# Patient Record
Sex: Female | Born: 1950 | Race: Black or African American | Hispanic: No | Marital: Single | Smoking: Never smoker
Health system: Southern US, Community
[De-identification: ages and names within clinical notes are randomized; demographics above are authoritative.]

## PROBLEM LIST (undated history)

## (undated) DIAGNOSIS — K2970 Gastritis, unspecified, without bleeding: Secondary | ICD-10-CM

## (undated) DIAGNOSIS — R51 Headache: Secondary | ICD-10-CM

## (undated) DIAGNOSIS — E785 Hyperlipidemia, unspecified: Secondary | ICD-10-CM

## (undated) DIAGNOSIS — I10 Essential (primary) hypertension: Secondary | ICD-10-CM

## (undated) DIAGNOSIS — I639 Cerebral infarction, unspecified: Secondary | ICD-10-CM

## (undated) DIAGNOSIS — R519 Headache, unspecified: Secondary | ICD-10-CM

## (undated) DIAGNOSIS — G43909 Migraine, unspecified, not intractable, without status migrainosus: Secondary | ICD-10-CM

## (undated) HISTORY — DX: Headache, unspecified: R51.9

## (undated) HISTORY — PX: ELBOW SURGERY: SHX618

## (undated) HISTORY — DX: Cerebral infarction, unspecified: I63.9

## (undated) HISTORY — DX: Migraine, unspecified, not intractable, without status migrainosus: G43.909

## (undated) HISTORY — DX: Headache: R51

---

## 1999-07-16 ENCOUNTER — Other Ambulatory Visit: Admission: RE | Admit: 1999-07-16 | Discharge: 1999-07-16 | Payer: Self-pay | Admitting: Family Medicine

## 1999-07-30 ENCOUNTER — Ambulatory Visit (HOSPITAL_COMMUNITY): Admission: RE | Admit: 1999-07-30 | Discharge: 1999-07-30 | Payer: Self-pay | Admitting: Orthopedic Surgery

## 1999-07-30 ENCOUNTER — Encounter: Payer: Self-pay | Admitting: Orthopedic Surgery

## 1999-08-13 ENCOUNTER — Ambulatory Visit (HOSPITAL_COMMUNITY): Admission: RE | Admit: 1999-08-13 | Discharge: 1999-08-13 | Payer: Self-pay | Admitting: Orthopedic Surgery

## 1999-08-13 ENCOUNTER — Encounter: Payer: Self-pay | Admitting: Orthopedic Surgery

## 2003-12-21 ENCOUNTER — Emergency Department (HOSPITAL_COMMUNITY): Admission: EM | Admit: 2003-12-21 | Discharge: 2003-12-21 | Payer: Self-pay

## 2004-05-30 ENCOUNTER — Other Ambulatory Visit: Admission: RE | Admit: 2004-05-30 | Discharge: 2004-05-30 | Payer: Self-pay | Admitting: Family Medicine

## 2004-07-22 ENCOUNTER — Ambulatory Visit (HOSPITAL_COMMUNITY): Admission: RE | Admit: 2004-07-22 | Discharge: 2004-07-22 | Payer: Self-pay | Admitting: Gastroenterology

## 2009-11-07 ENCOUNTER — Observation Stay (HOSPITAL_COMMUNITY): Admission: EM | Admit: 2009-11-07 | Discharge: 2009-11-07 | Payer: Self-pay | Admitting: Emergency Medicine

## 2010-11-16 LAB — CARDIAC PANEL(CRET KIN+CKTOT+MB+TROPI)
CK, MB: 1.8 ng/mL (ref 0.3–4.0)
CK, MB: 2 ng/mL (ref 0.3–4.0)
CK, MB: 2.4 ng/mL (ref 0.3–4.0)
Total CK: 79 U/L (ref 7–177)
Total CK: 89 U/L (ref 7–177)
Troponin I: 0.01 ng/mL (ref 0.00–0.06)

## 2010-11-16 LAB — URINALYSIS, ROUTINE W REFLEX MICROSCOPIC
Bilirubin Urine: NEGATIVE
Glucose, UA: NEGATIVE mg/dL
Hgb urine dipstick: NEGATIVE
Ketones, ur: NEGATIVE mg/dL
Nitrite: NEGATIVE
Protein, ur: NEGATIVE mg/dL
Specific Gravity, Urine: 1.014 (ref 1.005–1.030)
Urobilinogen, UA: 0.2 mg/dL (ref 0.0–1.0)
pH: 7 (ref 5.0–8.0)

## 2010-11-16 LAB — BASIC METABOLIC PANEL
BUN: 6 mg/dL (ref 6–23)
CO2: 25 mEq/L (ref 19–32)
Calcium: 8.8 mg/dL (ref 8.4–10.5)
Chloride: 107 mEq/L (ref 96–112)
Creatinine, Ser: 0.7 mg/dL (ref 0.4–1.2)
GFR calc Af Amer: >60 mL/min (ref >60)
GFR calc non Af Amer: >60 mL/min (ref >60)
Glucose, Bld: 108 mg/dL — ABNORMAL HIGH (ref 70–99)
Potassium: 3.6 mEq/L (ref 3.5–5.1)
Sodium: 139 mEq/L (ref 135–145)

## 2010-11-16 LAB — LIPID PANEL
LDL Cholesterol: 140 mg/dL — ABNORMAL HIGH (ref 0–99)
VLDL: 8 mg/dL (ref 0–40)

## 2010-11-16 LAB — CBC
MCHC: 33.6 g/dL (ref 30.0–36.0)
Platelets: 235 10*3/uL (ref 150–400)
RBC: 4.19 MIL/uL (ref 3.87–5.11)
RDW: 13.5 % (ref 11.5–15.5)

## 2010-11-16 LAB — POCT CARDIAC MARKERS
CKMB, poc: 2 ng/mL (ref 1.0–8.0)
Troponin i, poc: <0.05 ng/mL (ref 0.00–0.09)
Troponin i, poc: <0.05 ng/mL (ref 0.00–0.09)

## 2010-11-16 LAB — PROTIME-INR
INR: 0.97 (ref 0.00–1.49)
Prothrombin Time: 12.8 seconds (ref 11.6–15.2)

## 2010-11-16 LAB — DIFFERENTIAL
Basophils Absolute: 0.1 10*3/uL (ref 0.0–0.1)
Basophils Relative: 2 % — ABNORMAL HIGH (ref 0–1)
Neutro Abs: 3 10*3/uL (ref 1.7–7.7)
Neutrophils Relative %: 50 % (ref 43–77)

## 2010-11-16 LAB — TSH: TSH: 1.256 u[IU]/mL (ref 0.350–4.500)

## 2010-12-21 ENCOUNTER — Observation Stay (HOSPITAL_COMMUNITY)
Admission: AD | Admit: 2010-12-21 | Discharge: 2010-12-23 | Disposition: A | Discharge status: Home / Self Care | Payer: BC Managed Care – PPO | Source: Ambulatory Visit | Attending: Internal Medicine | Admitting: Internal Medicine

## 2010-12-21 ENCOUNTER — Emergency Department (HOSPITAL_COMMUNITY): Payer: BC Managed Care – PPO

## 2010-12-21 DIAGNOSIS — K2970 Gastritis, unspecified, without bleeding: Secondary | ICD-10-CM | POA: Insufficient documentation

## 2010-12-21 DIAGNOSIS — I10 Essential (primary) hypertension: Secondary | ICD-10-CM | POA: Insufficient documentation

## 2010-12-21 DIAGNOSIS — K2990 Gastroduodenitis, unspecified, without bleeding: Secondary | ICD-10-CM | POA: Insufficient documentation

## 2010-12-21 DIAGNOSIS — E785 Hyperlipidemia, unspecified: Secondary | ICD-10-CM | POA: Insufficient documentation

## 2010-12-21 DIAGNOSIS — R0789 Other chest pain: Principal | ICD-10-CM | POA: Insufficient documentation

## 2010-12-21 DIAGNOSIS — K219 Gastro-esophageal reflux disease without esophagitis: Secondary | ICD-10-CM | POA: Insufficient documentation

## 2010-12-21 HISTORY — DX: Essential (primary) hypertension: I10

## 2010-12-21 LAB — BASIC METABOLIC PANEL
BUN: 9 mg/dL (ref 6–23)
Calcium: 9.2 mg/dL (ref 8.4–10.5)
GFR calc non Af Amer: >60 mL/min (ref >60)
Glucose, Bld: 110 mg/dL — ABNORMAL HIGH (ref 70–99)

## 2010-12-21 LAB — DIFFERENTIAL
Basophils Absolute: 0 10*3/uL (ref 0.0–0.1)
Eosinophils Absolute: 0.1 10*3/uL (ref 0.0–0.7)
Eosinophils Relative: 1 % (ref 0–5)
Lymphocytes Relative: 51 % — ABNORMAL HIGH (ref 12–46)
Lymphs Abs: 2.7 10*3/uL (ref 0.7–4.0)
Monocytes Absolute: 0.5 10*3/uL (ref 0.1–1.0)

## 2010-12-21 LAB — CBC
HCT: 35.4 % — ABNORMAL LOW (ref 36.0–46.0)
MCHC: 33.1 g/dL (ref 30.0–36.0)
MCV: 89.6 fL (ref 78.0–100.0)
RDW: 13.1 % (ref 11.5–15.5)

## 2010-12-22 ENCOUNTER — Inpatient Hospital Stay (HOSPITAL_COMMUNITY): Payer: BC Managed Care – PPO

## 2010-12-22 ENCOUNTER — Encounter (HOSPITAL_COMMUNITY): Payer: Self-pay

## 2010-12-22 DIAGNOSIS — R072 Precordial pain: Secondary | ICD-10-CM

## 2010-12-22 DIAGNOSIS — R079 Chest pain, unspecified: Secondary | ICD-10-CM

## 2010-12-22 LAB — CBC
HCT: 33.3 % — ABNORMAL LOW (ref 36.0–46.0)
MCHC: 33 g/dL (ref 30.0–36.0)
MCV: 90.2 fL (ref 78.0–100.0)
RDW: 13.3 % (ref 11.5–15.5)

## 2010-12-22 LAB — CARDIAC PANEL(CRET KIN+CKTOT+MB+TROPI)
CK, MB: 1.6 ng/mL (ref 0.3–4.0)
Relative Index: 1.5 (ref 0.0–2.5)
Relative Index: INVALID (ref 0.0–2.5)
Total CK: 118 U/L (ref 7–177)
Total CK: 97 U/L (ref 7–177)
Troponin I: 0.01 ng/mL (ref 0.00–0.06)
Troponin I: 0.01 ng/mL (ref 0.00–0.06)

## 2010-12-22 LAB — DIFFERENTIAL
Eosinophils Relative: 2 % (ref 0–5)
Lymphocytes Relative: 55 % — ABNORMAL HIGH (ref 12–46)
Lymphs Abs: 3 10*3/uL (ref 0.7–4.0)
Monocytes Absolute: 0.5 10*3/uL (ref 0.1–1.0)
Monocytes Relative: 9 % (ref 3–12)

## 2010-12-22 LAB — MAGNESIUM: Magnesium: 2 mg/dL (ref 1.5–2.5)

## 2010-12-22 LAB — COMPREHENSIVE METABOLIC PANEL
Alkaline Phosphatase: 72 U/L (ref 39–117)
BUN: 8 mg/dL (ref 6–23)
Glucose, Bld: 100 mg/dL — ABNORMAL HIGH (ref 70–99)
Potassium: 3.8 mEq/L (ref 3.5–5.1)
Total Protein: 6 g/dL (ref 6.0–8.3)

## 2010-12-22 LAB — LIPID PANEL
HDL: 52 mg/dL (ref >39)
Triglycerides: 92 mg/dL (ref <150)

## 2010-12-22 LAB — TSH: TSH: 1.616 u[IU]/mL (ref 0.350–4.500)

## 2010-12-22 MED ORDER — TECHNETIUM TC 99M TETROFOSMIN IV KIT
10.0000 | PACK | Freq: Once | INTRAVENOUS | Status: AC | PRN
  Administered 2010-12-22: 9.3 via INTRAVENOUS

## 2010-12-22 MED ORDER — TECHNETIUM TC 99M TETROFOSMIN IV KIT
30.0000 | PACK | Freq: Once | INTRAVENOUS | Status: AC | PRN
  Administered 2010-12-22: 28.4 via INTRAVENOUS

## 2010-12-22 NOTE — H&P (Signed)
NAMECONSEPCION, UTT            ACCOUNT NO.:  [id number]  MEDICAL RECORD NO.:  [id number]           PATIENT TYPE:  E  LOCATION:  APED                          FACILITY:  [facility]  PHYSICIAN:  Talmage Nap, MD  DATE OF BIRTH:  12/22/1950  DATE OF ADMISSION:  12/21/2010 DATE OF DISCHARGE:  LH                             HISTORY & PHYSICAL   PRIMARY CARE PHYSICIAN:  Unassigned.  History obtainable from the patient, the patient's daughter, and aunt.  CHIEF COMPLAINT:  Sudden onset of chest pain, which started at about 11:00 a.m. on December 21, 2010.  The patient is a 60 year old African American female with history of hypertension and hypercholesterolemia as well as gastritis was said to have been in stable health until about 11:00 a.m. on December 21, 2010, when the patient developed retrosternal chest pain while she was cooking.  She described the pain as pressure-like, 10/10 intensity radiating to the neck, to the shoulder, and to the left arm.  This was said to be associated with shortness of breath and diaphoresis.  She claims she was nauseated and had one episode of vomiting.  She denied any fever.  She denied any chills.  She denied any rigor.  Pain was said to have persisted, and the patient first, was said to go to bed and woken up, and pain still persisted.  Pain was said not to have abated and subsequently, the patient presented to the emergency room.  The patient had a similar episode of this chest pain about a year ago, and she was supposed to have a stress test done as an outpatient, and she did not follow up.  After evaluation, the patient was advised to be admitted for further workup and possibly a stress test in this admission.  PAST MEDICAL HISTORY: 1. Positive for hypertension. 2. Gastritis. 3. Hypercholesterolemia.  PAST SURGICAL HISTORY:  Right arm fracture status post open reduction and internal fixation.  PREADMISSION MEDICATIONS:  Benicar/hct, dose  unknown and atorvastatin, dose unknown.  She has no known allergies.  SOCIAL HISTORY:  Negative for alcohol, tobacco use, and the patient works in the [employer].  FAMILY HISTORY:  York Spaniel to be positive for coronary artery disease. Father died of a massive MI in his mid 106s.  REVIEW OF SYSTEMS:  The patient denies any history of headaches.  No blurred vision.  Presently, denies any nausea or vomiting.  No fever. No chills.  No rigor.  No chest pain or shortness of breath.  No abdominal discomfort.  No diarrhea or hematochezia.  No dysuria or hematuria.  No swelling of the lower extremities.  No intolerance to heat or cold, and no neuropsychiatric disorder.  PHYSICAL EXAMINATION:  GENERAL:  Middle-aged lady, not in any obvious respiratory distress, well hydrated. VITAL SIGNS:  Blood pressure is 156/84, pulse is 95, respiratory rate is 18, temperature is 99.6. HEENT:  Pupils are reactive to light and extraocular muscles are intact. NECK:  No jugular venous distention.  No carotid bruit.  No lymphadenopathy. CHEST:  Clear to auscultation. HEART:  Sounds are 1 and 2. ABDOMEN:  Soft, nontender.  Liver, spleen, kidney not  palpable.  Bowel sounds are positive. EXTREMITIES:  No pedal edema. NEUROLOGIC:  Nonfocal. MUSCULOSKELETAL:  Unremarkable. SKIN:  Normal turgor.  LABORATORY DATA:  Initial complete blood count with differential showed WBC of 5.2, hemoglobin of 11.7, hematocrit of 35.4, MCV of 89.6 with a platelet count of 228, neutrophil is 39%, lymphocyte is 51%.  Basic metabolic panel showed sodium of 140, potassium of 2.9, chloride of 102 with a bicarb of 30, glucose is 110, BUN is 9 and creatinine is 0.93. First set of cardiac markers, troponin I less than 0.05 and CK-MB is less than 1.0.  EKG showed normal sinus rhythm with no acute ST-wave change, rate is 93.  Imaging study done on the patient include chest x- ray, which was essentially normal.  IMPRESSION: 1.  Chest pain questionable unstable angina. 2. Hypokalemia. 3. Hypertension. 4. Hypercholesterolemia. 5. Gastritis. 6. Non-compliant with discharge instructions (the patient did not     follow up with stress test as an outpatient in March 2011).  Plan is to admit the patient to telemetry.  The patient will be given: 1. Aspirin 325 mg p.o. daily. 2. Nitroglycerin 0.4 mg sublingual p.r.n. for chest pain. 3. O2 via nasal cannula 3 liters per minute. 4. Morphine 2 mg IV q.4 p.r.n. for chest pain. 5. Benicar HCT 20/25 one p.o. daily. 6. KCl 20 mEq p.o. b.i.d. 7. Lopressor 50 mg p.o. b.i.d. 8. Atorvastatin 20 mg p.o. daily. 9. GI prophylaxis will be with Protonix 40 mg IV q.24. 10.DVT prophylaxis with Lovenox 40 mg subcu q.24.  Other labs to be ordered on this patient will include cardiac enzymes q.6 x3, 2D echo, lipid panel, thyroid panel which include TSH, T3 and T4, CBCD, CMP, and magnesium will be repeated in a.m. Cardiology will be consulted in a.m.for evaluation of the patient for possible cardiolite stress test  in this admission since she failed an outpatient follow-up for a stress test about a year ago.  The patient will be followed and evaluated on daily basis.     Talmage Nap, MD     CN/MEDQ  D:  12/22/2010  T:  12/22/2010  Job:  [id number]  Electronically Signed by Talmage Nap  on 12/22/2010 03:27:05 AM

## 2010-12-27 NOTE — Consult Note (Signed)
NAMEWILMOTH, RASNIC            ACCOUNT NO.:  [id number]  MEDICAL RECORD NO.:  [id number]           PATIENT TYPE:  I  LOCATION:  A204                          FACILITY:  [facility]  PHYSICIAN:  Gerrit Friends. Dietrich Pates, MD, FACCDATE OF BIRTH:  03-02-1951  DATE OF CONSULTATION:  12/22/2010 DATE OF DISCHARGE:                                CONSULTATION   PRIMARY CARDIOLOGIST:  Marsa Aris. Dietrich Pates, MD, [facility]  PRIMARY CARE PHYSICIAN:  Paulita Cradle, family nurse practitioner at [facility].  REASON FOR CONSULTATION:  Chest pain.  HISTORY OF PRESENT ILLNESS:  This is a 60 year old African American female without prior cardiac history who has a history of hypertension and hypercholesterolemia who while cooking on the day of admission, had severe indigestion around 11 a.m., lasting until about 4 p.m.  She took some antacids pills and laid down.  The patient got up about 8 p.m. and felt very lightheaded and dizzy.  The pain returned, that got worse throughout the evening, and she presented to the emergency room.  She states that the pain did radiate to her left shoulder and down her left arm with tingling in her hand.  On arrival to the emergency room, the patient was found to be in normal sinus rhythm.  Her blood pressure was 156/84 with a heart rate of 95, respirations 12.  She was given sublingual nitroglycerin.  She was found to have a potassium of 2.9. This was replaced.  She was also given morphine and aspirin and admitted to rule out myocardial infarction.  We were asked for cardiac evaluation in this setting.  Of note, she was admitted in March 2011 for similar symptoms and ruled out for MI.  At that time, she was scheduled for an outpatient stress Myoview which she did not show for.  PAST MEDICAL HISTORY:  Hypertension, hypercholesterolemia, gastritis.  REVIEW OF SYSTEMS:  Positive for chest pain, lightheadedness, indigestion, low-grade nausea with some  soreness and pain, radiating to the left shoulder down the left arm with tingling in the left hand.  All other systems are reviewed and are found to be negative.  PAST SURGICAL HISTORY:  Right ORIF of ankle.  SOCIAL HISTORY:  She lives in [place] with her mother.  She works at [employer].  She is unmarried.  She does not smoke, drink, or use drugs.  FAMILY HISTORY:  Mother with "heart issues."  Father deceased of an MI at age 25.  She has a brother and a sister who are mentally disabled.  CURRENT MEDICATIONS PRIOR TO ADMISSION: 1. Benicar/HCTZ 40/25 mg daily. 2. Atorvastatin 10 mg daily.  ALLERGIES:  No known drug allergies.  CURRENT LABS:  Sodium 140, potassium 3.8, chloride 104, CO2 31, BUN 8, creatinine 0.95, glucose 100.  Hemoglobin 11.0, hematocrit 33.3, white blood cells 5.4, platelets 255.  Troponin less than 0.05 and 0.01 respectively.  Chest x-ray revealing no acute findings.  EKG revealing normal sinus rhythm with a rate of 93 beats per minute.  PHYSICAL EXAMINATION:  VITAL SIGNS:  Blood pressure 114/76, pulse 61, respirations 16, temperature 98.0, O2 sat 99% on 2 liters.  She weighs 72.4 kg. GENERAL:  She is awake, alert, and oriented, in no acute distress. HEENT:  Head is normocephalic and atraumatic.  Eyes, PERRLA. NECK:  Supple without JVD, thyromegaly, or carotid bruits appreciated. CARDIOVASCULAR:  Regular rate and rhythm without murmurs, rubs, or gallops.  Pulses are 2+ and equal without bruits. LUNGS:  Clear to auscultation without wheezes, rales, or rhonchi. ABDOMEN:  Soft, nontender with 2+ bowel sounds. EXTREMITIES:  Without clubbing, cyanosis, or edema. MUSCULOSKELETAL:  No joint deformity or effusions. NEURO:  Cranial nerves II-XII are grossly intact.  IMPRESSION: 1. Chest pain with negative cardiac enzymes and negative EKGs.  Pain     is worrisome for unstable angina but appears to be more     gastrointestinal in etiology with indigestion.   She was admitted in     March 2011 for same to rule out myocardial infarction which was     ruled out.  She was scheduled for an outpatient stress Myoview but     did not show for the test.  We will have stress test completed this     a.m.  She will be held n.p.o.  Echocardiogram will be completed for     left ventricular function in the setting of hypertension.  For risk     stratification, we will check lipids and TSH. 2. Hypertension.  The patient's blood pressure is currently     controlled.  They have added Lopressor 50 mg b.i.d. to her current     medication regimen with good results.  We will hold the Lopressor     for stress test this morning. 3. Hypercholesterolemia.  We will check status. 4. Rule out gastrointestinal etiology.  We will check Helicobacter     pylori.  She does not have a positive Murphy sign and her LFTs are     normal.  Therefore, it is unlikely at this time to be gallbladder     related. 5. Hypokalemia.  We believe that this may be related to the HCTZ as a     part of her medication regimen.  I agree with adding potassium     replacement.  On behalf of the physicians and providers of [facility], we would like to thank Paulita Cradle at [facility] and the [facility] for allowing [place] to participate in the care of this patient.     Bettey Mare. Lyman Bishop, NP   ______________________________ Gerrit Friends. Dietrich Pates, MD, [facility]    KML/MEDQ  D:  12/22/2010  T:  12/22/2010  Job:  [id number]  cc:   Phill Myron, NP [facility]  [facility]  Electronically Signed by Joni Reining NP on 12/25/2010 04:34:08 PM Electronically Signed by [place] Bing MD [facility] on 12/27/2010 10:24:06 PM

## 2011-01-09 NOTE — Op Note (Signed)
Krista Finley, Krista Finley            ACCOUNT NO.:  [id number]   MEDICAL RECORD NO.:  [id number]          PATIENT TYPE:  AMB   LOCATION:  ENDO                         FACILITY:  [facility]   PHYSICIAN:  Graylin Shiver, M.D.   DATE OF BIRTH:  10/03/1950   DATE OF PROCEDURE:  07/22/2004  DATE OF DISCHARGE:                                 OPERATIVE REPORT   PROCEDURE PERFORMED:  Colonoscopy.   INDICATIONS FOR PROCEDURE:  Heme positive stool.   Informed consent was obtained after explanation of the risks of bleeding,  infection, and perforation.   PREMEDICATIONS:  The procedure was done immediately after an  esophagogastroduodenoscopy with an additional 25 mcg of fentanyl and 1 mg of  Versed given IV.   DESCRIPTION OF PROCEDURE:  With the patient in the left lateral decubitus  position, a rectal exam was performed and no masses were felt.  The Olympus  colonoscope was inserted into the rectum and advanced around the colon to  the cecum.  Cecal landmarks were identified.  The cecum and ascending colon  were normal.  The transverse colon normal.  The descending colon, sigmoid  and rectum were normal.  The scope was retroflexed in the rectum and this  looked normal.  The scope was straightened and brought out.  The patient  tolerated the procedure well without complications.   IMPRESSION:  Normal colonoscopy to the cecum.       SFG/MEDQ  D:  07/22/2004  T:  07/22/2004  Job:  [id number]   cc:   Magnus Sinning. Dimple Casey, M.D.  [address] [place]  [place] [address]  Fax: [phone]

## 2011-01-09 NOTE — Op Note (Signed)
NAMEARCENIA, Krista Finley            ACCOUNT NO.:  [id number]   MEDICAL RECORD NO.:  [id number]          PATIENT TYPE:  AMB   LOCATION:  ENDO                         FACILITY:  [facility]   PHYSICIAN:  Graylin Shiver, M.D.   DATE OF BIRTH:  [birth year]   DATE OF PROCEDURE:  07/22/2004  DATE OF DISCHARGE:                                 OPERATIVE REPORT   PROCEDURE PERFORMED:  Esophagogastroduodenoscopy with biopsy for CLO test.   ENDOSCOPIST:  Graylin Shiver, M.D.   INDICATIONS FOR PROCEDURE:  Chronic heartburn.   Informed consent was obtained after explanation of the risks of bleeding,  infection and perforation.   PREMEDICATIONS:  Fentanyl 75 mcg IV, Versed 7.5 mg IV.   DESCRIPTION OF PROCEDURE:  With the patient in the left lateral decubitus  position the Olympus gastroscope was inserted into the oropharynx and passed  into the esophagus.  It was advanced down the esophagus and into the stomach  and into the duodenum.  The second portion and bulb of the duodenum were  normal.  The stomach showed some mild erythema compatible with a gastritis.  No ulcers or erosions were seen.  No lesions were seen at the fundus or  cardia on retroflexion.  A biopsy was obtained from the distal stomach for  CLO test.  The esophagus looked normal.  The esophagogastric junction was at  40 cm. The patient tolerated the procedure well without complications.   IMPRESSION:  Mild gastritis.   PLAN:  CLO test will be checked.       SFG/MEDQ  D:  07/22/2004  T:  07/22/2004  Job:  [id number]   cc:   Magnus Sinning. Dimple Casey, M.D.  [address] [place]  [place] [address]  Fax: [phone]

## 2011-01-15 NOTE — Discharge Summary (Signed)
[name], Finley            ACCOUNT NO.:  [id number]  MEDICAL RECORD NO.:  [id number]           PATIENT TYPE:  O  LOCATION:  A204                          [facility]ILITY:  [facility]  PHYSICIAN:  Peggye Pitt, M.D. DATE OF BIRTH:  [birth year]  DATE OF ADMISSION:  12/21/2010 DATE OF DISCHARGE:  05/01/2012LH                              DISCHARGE SUMMARY   PRIMARY CARE PHYSICIAN:  Paulita Cradle.  DISCHARGE DIAGNOSES: 1. Chest pain, ruled out for acute coronary syndrome, likely secondary     to gastroesophageal reflux disease/gastritis. 2. History of hypertension. 3. Hyperlipidemia. 4. Gastritis.  DISCHARGE MEDICATIONS: 1. Protonix 40 mg daily. 2. Aspirin 325 mg daily. 3. Benicar HCT 40/25 mg daily. 4. Citracal 1 tablet daily. 5. Lipitor 10 mg in the evening. 6. Multivitamin 1 tablet daily.  DISPOSITION AND FOLLOWUP:  Krista Finley will be discharged home today in stable and improved condition.  Please note that she has had a negative stress Myoview this admit.  She will follow up with her PCP in 3-4 weeks for further recommendations.  CONSULTATION THIS HOSPITALIZATION:  Gerrit Friends. Dietrich Pates, MD, [facility] with Cardiology.  IMAGES AND PROCEDURES: 1. A chest x-ray on December 21, 2010, that showed no acute findings. 2. A stress Myoview performed on December 22, 2010, formal results are     pending.  However, I have received a message from Dr. Dietrich Pates that     stress test is normal and she is okay to go home today.  HISTORY AND PHYSICAL:  For complete details, please refer to dictation by Dr. Beverly Gust on December 22, 2010, but in brief, Krista Finley is a 60- year-old [place] American lady with a history of hypertension, hyperlipidemia, and gastritis diagnosed on EGD by Dr. Evette Cristal in [place] in 2005.  She presented to the hospital with complaints of sudden onset of chest pain.  Apparently, she was cooking, pain radiated to her neck, shoulder, and to her left arm, and felt like a pressure  on her chest.  Because of this, she was brought to the hospital for further evaluation.  HOSPITAL COURSE BY PROBLEM: 1. Chest pain.  She ruled out for acute coronary syndrome with     negative cardiac enzymes and EKGs that demonstrated no acute     ischemic changes.  Because of her coronary artery disease risk     factors, she was taken for a stress Myoview which results are     negative as above.  Her pain to me sounds more like reflux.  She     states the pain is in the center of her chest and travels up into     the neck area and it is especially worse when lying down flat.  She     does have a prior history of gastritis as documented on EGD by Dr.     Evette Cristal in 2005.  At this point, I will start her on a PPI 40 mg     daily.  She is instructed to follow up with her PCP in about 3-4     weeks for followup. 2. All rest of chronic conditions have been stable.  VITALS ON THE DAY OF DISCHARGE:  Blood pressure 99/64, heart rate 68, respirations 18, sats 98% on room air, and temp 97.9.     Peggye Pitt, M.D.     EH/MEDQ  D:  12/23/2010  T:  12/23/2010  Job:  [id number]  cc:   Marisue Ivan. Dietrich Pates, MD, [facility] [address] [place], [place] [address]  Electronically Signed by Peggye Pitt M.D. on 01/15/2011 08:28:31 AM

## 2012-09-17 ENCOUNTER — Encounter (HOSPITAL_COMMUNITY): Payer: Self-pay | Admitting: Family Medicine

## 2012-09-17 ENCOUNTER — Emergency Department (HOSPITAL_COMMUNITY): Payer: BC Managed Care – PPO

## 2012-09-17 ENCOUNTER — Observation Stay (HOSPITAL_COMMUNITY)
Admission: EM | Admit: 2012-09-17 | Discharge: 2012-09-18 | Disposition: A | Discharge status: Home / Self Care | Payer: BC Managed Care – PPO | Attending: Emergency Medicine | Admitting: Emergency Medicine

## 2012-09-17 DIAGNOSIS — R05 Cough: Secondary | ICD-10-CM | POA: Insufficient documentation

## 2012-09-17 DIAGNOSIS — I10 Essential (primary) hypertension: Secondary | ICD-10-CM | POA: Insufficient documentation

## 2012-09-17 DIAGNOSIS — E785 Hyperlipidemia, unspecified: Secondary | ICD-10-CM | POA: Insufficient documentation

## 2012-09-17 DIAGNOSIS — R079 Chest pain, unspecified: Principal | ICD-10-CM | POA: Insufficient documentation

## 2012-09-17 DIAGNOSIS — R0789 Other chest pain: Secondary | ICD-10-CM

## 2012-09-17 DIAGNOSIS — Z79899 Other long term (current) drug therapy: Secondary | ICD-10-CM | POA: Insufficient documentation

## 2012-09-17 DIAGNOSIS — R42 Dizziness and giddiness: Secondary | ICD-10-CM | POA: Insufficient documentation

## 2012-09-17 DIAGNOSIS — R059 Cough, unspecified: Secondary | ICD-10-CM | POA: Insufficient documentation

## 2012-09-17 DIAGNOSIS — Z7902 Long term (current) use of antithrombotics/antiplatelets: Secondary | ICD-10-CM | POA: Insufficient documentation

## 2012-09-17 DIAGNOSIS — R209 Unspecified disturbances of skin sensation: Secondary | ICD-10-CM | POA: Insufficient documentation

## 2012-09-17 HISTORY — DX: Hyperlipidemia, unspecified: E78.5

## 2012-09-17 HISTORY — DX: Gastritis, unspecified, without bleeding: K29.70

## 2012-09-17 LAB — BASIC METABOLIC PANEL
BUN: 9 mg/dL (ref 6–23)
CO2: 26 mEq/L (ref 19–32)
Chloride: 104 mEq/L (ref 96–112)
Creatinine, Ser: 0.68 mg/dL (ref 0.50–1.10)
GFR calc Af Amer: >90 mL/min (ref >90)
Glucose, Bld: 99 mg/dL (ref 70–99)

## 2012-09-17 LAB — TROPONIN I
Troponin I: <0.3 ng/mL (ref <0.30)
Troponin I: <0.3 ng/mL (ref <0.30)

## 2012-09-17 LAB — CBC
HCT: 34.8 % — ABNORMAL LOW (ref 36.0–46.0)
MCV: 90.4 fL (ref 78.0–100.0)
RDW: 13.6 % (ref 11.5–15.5)
WBC: 4.8 10*3/uL (ref 4.0–10.5)

## 2012-09-17 MED ORDER — NITROGLYCERIN 0.4 MG SL SUBL
0.4000 mg | SUBLINGUAL_TABLET | SUBLINGUAL | Status: AC | PRN
  Administered 2012-09-17 (×3): 0.4 mg via SUBLINGUAL
  Filled 2012-09-17: qty 25

## 2012-09-17 MED ORDER — ACETAMINOPHEN 325 MG PO TABS
650.0000 mg | ORAL_TABLET | Freq: Once | ORAL | Status: AC
  Administered 2012-09-17: 650 mg via ORAL
  Filled 2012-09-17: qty 2

## 2012-09-17 MED ORDER — ASPIRIN 81 MG PO CHEW
324.0000 mg | CHEWABLE_TABLET | Freq: Once | ORAL | Status: AC
  Administered 2012-09-17: 324 mg via ORAL

## 2012-09-17 MED ORDER — MORPHINE SULFATE 4 MG/ML IJ SOLN
2.0000 mg | Freq: Once | INTRAMUSCULAR | Status: AC
  Administered 2012-09-17: 2 mg via INTRAVENOUS
  Filled 2012-09-17: qty 1

## 2012-09-17 NOTE — ED Notes (Signed)
Per EMS, pt woke up at 4:30 in the center of her chest radiating down right shoulder and arm. Went to work and became dizzy. No SOB or diaphoresis. 324 ASA and 1 nitro. sts chest pain intermittent.

## 2012-09-17 NOTE — ED Notes (Signed)
Patient transported to X-ray 

## 2012-09-17 NOTE — ED Provider Notes (Addendum)
History     CSN: [id number]  Arrival date & time 09/17/12  4098   First MD Initiated Contact with Patient 09/17/12 [phone]      Chief Complaint  Patient presents with  . Chest Pain    (Consider location/radiation/quality/duration/timing/severity/associated sxs/prior treatment) HPI Comments: Krista Finley awoke at 0430 with chest discomfort.  She reports that the discomfort moved into her right arm and it now feels numb.  It subsided and she went to work at a [employer].  The pain then returned and has been persistent over the last 1.5 hours.  She denies leg pain/swelling, fever, SOB, and NVD.  Patient is a 62 y.o. female presenting with chest pain. The history is provided by the patient. No language interpreter was used.  Chest Pain The chest pain began 3 - 5 hours ago. Chest pain occurs intermittently. The chest pain is improving. At its most intense, the pain is at 6/10. The pain is currently at 3/10. The severity of the pain is moderate. The quality of the pain is described as dull and pressure-like. The pain radiates to the right arm. Primary symptoms include cough (very mild) and dizziness. Pertinent negatives for primary symptoms include no fever, no fatigue, no shortness of breath, no palpitations, no abdominal pain, no nausea, no vomiting and no altered mental status.  Dizziness does not occur with nausea, vomiting, weakness or diaphoresis.  Associated symptoms include numbness (right arm).  Pertinent negatives for associated symptoms include no claudication, no diaphoresis, no lower extremity edema, no orthopnea, no paroxysmal nocturnal dyspnea and no weakness. She tried nothing for the symptoms.  Her past medical history is significant for hyperlipidemia and hypertension.  Procedure history is positive for stress thallium and exercise treadmill test.  Procedure history is negative for cardiac catheterization.     Past Medical History  Diagnosis Date  . Hypertension   .  Gastritis   . Hyperlipidemia     History reviewed. No pertinent past surgical history.  History reviewed. No pertinent family history.  History  Substance Use Topics  . Smoking status: Never Smoker   . Smokeless tobacco: Not on file  . Alcohol Use: No    OB History    Grav Para Term Preterm Abortions TAB SAB Ect Mult Living                  Review of Systems  Constitutional: Negative for fever, chills, diaphoresis, activity change, appetite change and fatigue.  HENT: Negative for congestion, sore throat, rhinorrhea, trouble swallowing, neck pain and neck stiffness.   Respiratory: Positive for cough (very mild). Negative for chest tightness, shortness of breath and stridor.   Cardiovascular: Positive for chest pain. Negative for palpitations, orthopnea, claudication and leg swelling.  Gastrointestinal: Negative for nausea, vomiting, abdominal pain, diarrhea and abdominal distention.  Genitourinary: Negative for dysuria.  Musculoskeletal: Negative.   Skin: Negative.   Neurological: Positive for dizziness, light-headedness, numbness (right arm) and headaches. Negative for syncope and weakness.  Psychiatric/Behavioral: Negative for confusion and altered mental status.    Allergies  Review of patient's allergies indicates no known allergies.  Home Medications   Current Outpatient Rx  Name  Route  Sig  Dispense  Refill  . ATORVASTATIN CALCIUM 20 MG PO TABS   Oral   Take 20 mg by mouth daily.         Marland Kitchen OLMESARTAN MEDOXOMIL-HCTZ 40-25 MG PO TABS   Oral   Take 1 tablet by mouth daily.  BP 178/87  Pulse 69  Temp 98.4 F (36.9 C) (Oral)  Resp 14  SpO2 100%  Physical Exam  Nursing note and vitals reviewed. Constitutional: She appears well-developed and well-nourished. No distress.  HENT:  Head: Normocephalic and atraumatic.  Right Ear: External ear normal.  Left Ear: External ear normal.  Nose: Nose normal.  Mouth/Throat: Oropharynx is clear and  moist. No oropharyngeal exudate.  Eyes: Conjunctivae normal and EOM are normal. Pupils are equal, round, and reactive to light. Right eye exhibits no discharge. Left eye exhibits no discharge. No scleral icterus.  Neck: Normal range of motion. Neck supple. No JVD present. No tracheal deviation present.  Cardiovascular: Normal rate, regular rhythm, normal heart sounds and intact distal pulses.  Exam reveals no gallop and no friction rub.   No murmur heard. Pulmonary/Chest: Effort normal and breath sounds normal. No stridor. No respiratory distress. She has no wheezes. She has no rales. She exhibits no tenderness.  Abdominal: Soft. Bowel sounds are normal. She exhibits no distension and no mass. There is no tenderness. There is no rebound and no guarding.  Musculoskeletal: Normal range of motion. She exhibits no edema and no tenderness.  Lymphadenopathy:    She has no cervical adenopathy.  Neurological: She is alert. She is not disoriented. She displays no tremor. No cranial nerve deficit or sensory deficit. She exhibits normal muscle tone. Coordination and gait normal. GCS eye subscore is 4. GCS verbal subscore is 5. GCS motor subscore is 6. She displays no Babinski's sign on the right side. She displays no Babinski's sign on the left side.  Skin: Skin is warm and dry. No rash noted. She is not diaphoretic. No erythema. No pallor.  Psychiatric: She has a normal mood and affect. Her behavior is normal.    ED Course  Procedures (including critical care time)   Labs Reviewed  CBC  BASIC METABOLIC PANEL  TROPONIN I  D-DIMER, QUANTITATIVE  PROTIME-INR   No results found.   No diagnosis found.   Date: 09/17/2012 @ 0837  Rate: 70 bpm  Rhythm: sinus  QRS Axis: normal  Intervals: normal  ST/T Wave abnormalities: normal  Conduction Disutrbances:none  Narrative Interpretation:   Old EKG Reviewed: unchanged      MDM  Pt presents for evaluation of chest pain associated with right arm  numbness.  She appears nontoxic, note elevated BP, NAD.  Pt has risk factors for CAD but denies risk factores for DVT/PE.  The EKG demonstrates no evidence of acute ischemia and she is afebrile with no respiratory complaints.  She has no focal deficits to suggest CVA.  She has no GI complaints.  She had a 2012 evaluation for CP that included nl serial enzymes, nl EKGs, and a negative nuclear stress test.  She has never had a catheterization.  Will obtain routine cardiac labs and CXR.  Will administer aspirin, nitroglycerin, and morphine as needed.  As the results become available, if negative, will consider applying the ED Chest Pain Protocol including the cardiac CT for further risk stratefication.    1240.  Pt stable, NAD.  Will move to pod C and place under the CDU CP protocol.  Her evaluation so far has been unremarkable.   Tobin Chad, MD 09/17/12 1241 1440.  Pt stable, NAD.  She has a normal coronary artery CT scan (coronary calcium score 0).  She is currently pain free.  She has had some episodes of discomfort throughout the 24+ hours of ER obs.  Will start her on a trial of  PPI.  Encouraged close outpt f/u.  Plan discharge home.   Tobin Chad, MD 09/18/12 [phone]

## 2012-09-18 ENCOUNTER — Observation Stay (HOSPITAL_COMMUNITY): Payer: BC Managed Care – PPO

## 2012-09-18 MED ORDER — PANTOPRAZOLE SODIUM 40 MG IV SOLR
40.0000 mg | Freq: Once | INTRAVENOUS | Status: AC
  Administered 2012-09-18: 40 mg via INTRAVENOUS
  Filled 2012-09-18: qty 40

## 2012-09-18 MED ORDER — OMEPRAZOLE 20 MG PO CPDR: 20.0000 mg | DELAYED_RELEASE_CAPSULE | Freq: Every day | ORAL | Status: DC

## 2012-09-18 MED ORDER — NITROGLYCERIN 0.4 MG SL SUBL
0.4000 mg | SUBLINGUAL_TABLET | Freq: Once | SUBLINGUAL | Status: AC
  Administered 2012-09-18: 0.4 mg via SUBLINGUAL
  Filled 2012-09-18: qty 25

## 2012-09-18 MED ORDER — METOPROLOL TARTRATE 25 MG PO TABS
50.0000 mg | ORAL_TABLET | Freq: Once | ORAL | Status: AC
  Administered 2012-09-18: 50 mg via ORAL
  Filled 2012-09-18: qty 2

## 2012-09-18 MED ORDER — METOPROLOL TARTRATE 1 MG/ML IV SOLN
5.0000 mg | Freq: Once | INTRAVENOUS | Status: AC
  Administered 2012-09-18: 5 mg via INTRAVENOUS

## 2012-09-18 MED ORDER — SODIUM CHLORIDE 0.9 % IV SOLN: Freq: Once | INTRAVENOUS | Status: DC

## 2012-09-18 MED ORDER — METOPROLOL TARTRATE 1 MG/ML IV SOLN
5.0000 mg | Freq: Once | INTRAVENOUS | Status: AC
  Administered 2012-09-18: 5 mg via INTRAVENOUS
  Filled 2012-09-18: qty 10

## 2012-09-18 MED ORDER — METOPROLOL TARTRATE 1 MG/ML IV SOLN
5.0000 mg | Freq: Once | INTRAVENOUS | Status: AC
  Administered 2012-09-18: 5 mg via INTRAVENOUS
  Filled 2012-09-18: qty 5

## 2012-09-18 MED ORDER — METOPROLOL TARTRATE 25 MG PO TABS
100.0000 mg | ORAL_TABLET | Freq: Once | ORAL | Status: AC
  Administered 2012-09-18: 100 mg via ORAL
  Filled 2012-09-18: qty 4

## 2012-09-18 MED ORDER — GI COCKTAIL ~~LOC~~
30.0000 mL | Freq: Once | ORAL | Status: AC
  Administered 2012-09-18: 30 mL via ORAL
  Filled 2012-09-18: qty 30

## 2012-09-18 MED ORDER — IOHEXOL 350 MG/ML SOLN
80.0000 mL | Freq: Once | INTRAVENOUS | Status: AC | PRN
  Administered 2012-09-18: 80 mL via INTRAVENOUS

## 2012-09-18 NOTE — ED Provider Notes (Signed)
Labs/ekg reviewed Pt currently sleeping.  She is awaiting CT coronary in AM I have confirmed with charge nurse that we can do CT coronary on Sunday morning No acute issues at this time   Joya Gaskins, MD 09/18/12 0107

## 2012-09-18 NOTE — ED Notes (Signed)
Pt continues to rest quietly with eyes closed, resp e/u. No s/s of any pain or distress observed, visitor remains at bedside and will continue to monitor pt.

## 2012-11-24 ENCOUNTER — Ambulatory Visit (INDEPENDENT_AMBULATORY_CARE_PROVIDER_SITE_OTHER): Payer: BC Managed Care – PPO | Admitting: Physician Assistant

## 2012-11-24 ENCOUNTER — Ambulatory Visit (INDEPENDENT_AMBULATORY_CARE_PROVIDER_SITE_OTHER): Payer: BC Managed Care – PPO

## 2012-11-24 ENCOUNTER — Telehealth: Payer: Self-pay | Admitting: Nurse Practitioner

## 2012-11-24 VITALS — BP 142/85 | HR 76 | Temp 96.4°F | Ht 64.0 in | Wt 148.6 lb

## 2012-11-24 DIAGNOSIS — M25511 Pain in right shoulder: Secondary | ICD-10-CM

## 2012-11-24 DIAGNOSIS — M25519 Pain in unspecified shoulder: Secondary | ICD-10-CM

## 2012-11-24 MED ORDER — METHOCARBAMOL 750 MG PO TABS: 750.0000 mg | ORAL_TABLET | Freq: Every day | ORAL | Status: DC

## 2012-11-24 MED ORDER — HYDROCODONE-ACETAMINOPHEN 5-300 MG PO TABS: 5.0000 mg | ORAL_TABLET | Freq: Every evening | ORAL | Status: DC | PRN

## 2012-11-24 NOTE — Telephone Encounter (Signed)
appt made

## 2012-11-24 NOTE — Progress Notes (Signed)
  Subjective:    Patient ID: Krista Finley, female    DOB: 1951-08-15, 63 y.o.   MRN: [id number]  HPI Right shoulder pain, no accident no injury   Review of Systems  Musculoskeletal:       Right shoulder hurts  All other systems reviewed and are negative.       Objective:   Physical Exam  Vitals reviewed. Constitutional: She appears well-developed and well-nourished.  HENT:  Head: Normocephalic and atraumatic.  Eyes: Conjunctivae and EOM are normal. Pupils are equal, round, and reactive to light.  Cardiovascular: Normal rate and normal heart sounds.   Pulmonary/Chest: Effort normal and breath sounds normal.  Musculoskeletal:  Right shoulder abduction 110-115 degrees Right trapezial muscle tightness Decreased left rotation of cervical spine          Assessment & Plan:  Pain in joint, shoulder region, right - Plan: DG Shoulder Right, methocarbamol (ROBAXIN-750) 750 MG tablet, Hydrocodone-Acetaminophen 5-300 MG TABS, Ambulatory referral to Orthopedic Surgery  [facility] reading (PRIMARY) by  Dr. Maylon Cos NAD

## 2012-12-01 ENCOUNTER — Telehealth: Payer: Self-pay | Admitting: Family Medicine

## 2012-12-01 NOTE — Telephone Encounter (Signed)
Spoke with pt and advised her to call [facility] since unsure of meds  Ppt verbalized understanding

## 2013-02-17 ENCOUNTER — Ambulatory Visit (INDEPENDENT_AMBULATORY_CARE_PROVIDER_SITE_OTHER): Payer: BC Managed Care – PPO | Admitting: Family Medicine

## 2013-02-17 ENCOUNTER — Encounter: Payer: Self-pay | Admitting: Family Medicine

## 2013-02-17 VITALS — BP 162/82 | HR 79 | Temp 97.8°F | Wt 150.4 lb

## 2013-02-17 DIAGNOSIS — R51 Headache: Secondary | ICD-10-CM

## 2013-02-17 MED ORDER — ETODOLAC 500 MG PO TABS: 500.0000 mg | ORAL_TABLET | Freq: Two times a day (BID) | ORAL | Status: DC

## 2013-02-17 MED ORDER — CYCLOBENZAPRINE HCL 5 MG PO TABS: 5.0000 mg | ORAL_TABLET | Freq: Three times a day (TID) | ORAL | Status: DC | PRN

## 2013-02-21 NOTE — Patient Instructions (Signed)
Headache and Allergies The relationship between allergies and headaches is unclear. Many people with allergic or infectious nasal problems also have headaches (migraines or sinus headaches). However, sometimes allergies can cause pressure that feels like a headache, and sometimes headaches can cause allergy-like symptoms. It is not always clear whether your symptoms are caused by allergies or by a headache. CAUSES   Migraine: The cause of a migraine is not always known.  Sinus Headache: The cause of a sinus headache may be a sinus infection. Other conditions that may be related to sinus headaches include:  Hay fever (allergic rhinitis).  Deviation of the nasal septum.  Swelling or clogging of the nasal passages. SYMPTOMS  Migraine headache symptoms (which often last 4 to 72 hours) include:  Intense, throbbing pain on one or both sides of the head.  Nausea.  Vomiting.  Being extra sensitive to light.  Being extra sensitive to sound.  Nervous system reactions that appear similar to an allergic reaction:  Stuffy nose.  Runny nose.  Tearing. Sinus headaches are felt as facial pain or pressure.  DIAGNOSIS  Because there is some overlap in symptoms, sinus and migraine headaches are often misdiagnosed. For example, a person with migraines may also feel facial pressure. Likewise, many people with hay fever may get migraine headaches rather than sinus headaches. These migraines can be triggered by the histamine release during an allergic reaction. An antihistamine medicine can eliminate this pain. There are standard criteria that help clarify the difference between these headaches and related allergy or allergy-like symptoms. Your caregiver can use these criteria to determine the proper diagnosis and provide you the best care. TREATMENT  Migraine medicine may help people who have persistent migraine headaches even though their hay fever is controlled. For some people, anti-inflammatory  treatments do not work to relieve migraines. Medicines called triptans (such as sumatriptan) can be helpful for those people. Document Released: 10/31/2003 Document Revised: 11/02/2011 Document Reviewed: 11/22/2009 [employer] Patient Information 2014 [employer].  

## 2013-02-21 NOTE — Progress Notes (Signed)
  Subjective:    Patient ID: Krista Finley, female    DOB: 02-13-1951, 62 y.o.   MRN: [id number]  HPI  This 62 y.o. female presents for evaluation of headache for over a week.  She c/o discomfort in the back of her neck that encompasses her head.  Review of Systems C/o headache. No chest pain, SOB, dizziness, vision change, N/V, diarrhea, constipation, dysuria, urinary urgency or frequency, myalgias, arthralgias or rash.     Objective:   Physical Exam  Vital signs noted  Well developed well nourished female.  HEENT - Head atraumatic Normocephalic                Eyes - PERRLA, Conjuctiva - clear Sclera- Clear EOMI                Ears - EAC's Wnl TM's Wnl Gross Hearing WNL                Nose - Nares patent                 Throat - oropharanx wnl Respiratory - Lungs CTA bilateral Cardiac - RRR S1 and S2 without murmur MS - TTP occipital region and frontal region of head.      Assessment & Plan:  Headache(784.0) - Plan: etodolac (LODINE) 500 MG tablet, cyclobenzaprine (FLEXERIL) 5 MG tablet Recommend she follow up prn if sx's persist.

## 2013-05-31 ENCOUNTER — Encounter (INDEPENDENT_AMBULATORY_CARE_PROVIDER_SITE_OTHER): Payer: Self-pay

## 2013-05-31 ENCOUNTER — Encounter: Payer: Self-pay | Admitting: General Practice

## 2013-05-31 ENCOUNTER — Ambulatory Visit (INDEPENDENT_AMBULATORY_CARE_PROVIDER_SITE_OTHER): Payer: BC Managed Care – PPO

## 2013-05-31 ENCOUNTER — Ambulatory Visit (INDEPENDENT_AMBULATORY_CARE_PROVIDER_SITE_OTHER): Payer: BC Managed Care – PPO | Admitting: General Practice

## 2013-05-31 VITALS — BP 146/87 | HR 73 | Temp 97.6°F | Ht 64.0 in | Wt 149.0 lb

## 2013-05-31 DIAGNOSIS — R350 Frequency of micturition: Secondary | ICD-10-CM

## 2013-05-31 DIAGNOSIS — IMO0001 Reserved for inherently not codable concepts without codable children: Secondary | ICD-10-CM

## 2013-05-31 DIAGNOSIS — K5900 Constipation, unspecified: Secondary | ICD-10-CM

## 2013-05-31 DIAGNOSIS — R109 Unspecified abdominal pain: Secondary | ICD-10-CM

## 2013-05-31 LAB — POCT UA - MICROSCOPIC ONLY
Casts, Ur, LPF, POC: NEGATIVE
Mucus, UA: NEGATIVE
Yeast, UA: NEGATIVE

## 2013-05-31 LAB — POCT URINALYSIS DIPSTICK
Leukocytes, UA: NEGATIVE
Nitrite, UA: NEGATIVE
pH, UA: 6.5

## 2013-05-31 NOTE — Progress Notes (Signed)
Subjective:    Patient ID: Krista Finley, female    DOB: 07/26/1951, 62 y.o.   MRN: [id number]  Urinary Tract Infection  This is a new problem. The current episode started in the past 7 days. The problem occurs every urination. The quality of the pain is described as aching and burning. The pain is at a severity of 4/10. The patient is experiencing no pain. There has been no fever. She is not sexually active. There is no history of pyelonephritis. Associated symptoms include flank pain, frequency and urgency. Pertinent negatives include no chills, hematuria, nausea or vomiting. She has tried nothing for the symptoms. There is no history of catheterization, recurrent UTIs, a single kidney or a urological procedure.      Review of Systems  Constitutional: Negative for fever and chills.  Respiratory: Negative for chest tightness.   Gastrointestinal: Positive for abdominal pain. Negative for nausea, vomiting, blood in stool and abdominal distention.  Genitourinary: Positive for dysuria, urgency, frequency and flank pain. Negative for hematuria and difficulty urinating.  Neurological: Negative for dizziness, weakness and headaches.       Objective:   Physical Exam  Constitutional: She is oriented to person, place, and time. She appears well-developed and well-nourished.  HENT:  Mouth/Throat: Oropharynx is clear and moist.  Cardiovascular: Normal rate, regular rhythm and normal heart sounds.   Pulmonary/Chest: Effort normal and breath sounds normal.  Abdominal: Bowel sounds are normal. She exhibits distension. There is generalized tenderness. There is no rebound.  Neurological: She is alert and oriented to person, place, and time.  Skin: Skin is warm and dry.  Psychiatric: She has a normal mood and affect.    [facility] reading (PRIMARY) by Coralie Keens, FNP-C, large amount of stool noted in colon.                                   Results for orders placed in visit on 05/31/13   POCT URINALYSIS DIPSTICK      Result Value Range   Color, UA yellow     Clarity, UA clear     Glucose, UA neg     Bilirubin, UA neg     Ketones, UA neg     Spec Grav, UA 1.020     Blood, UA neg     pH, UA 6.5     Protein, UA small     Urobilinogen, UA negative     Nitrite, UA neg     Leukocytes, UA Negative    POCT UA - MICROSCOPIC ONLY      Result Value Range   WBC, Ur, HPF, POC neg     RBC, urine, microscopic neg     Bacteria, U Microscopic neg     Mucus, UA neg     Epithelial cells, urine per micros occ     Crystals, Ur, HPF, POC neg     Casts, Ur, LPF, POC neg     Yeast, UA neg           Assessment & Plan:  1. Frequency  - POCT urinalysis dipstick - POCT UA - Microscopic Only  2. Lower abdominal pain, unspecified laterality  - DG Abd 1 View; Future  3. Unspecified constipation -Miralax 17grams daily, for 1-4 days, until bowel movement  Increase fluid intake (water) Increase fiber in diet (fruits, vegetables, whole grains) Take stool softner daily Senokot tablets as directed -seek emergency  medical treatment if symptoms worsen Patient verbalized understanding Coralie Keens, FNP-C

## 2013-05-31 NOTE — Patient Instructions (Signed)
Constipation, Adult Constipation is when a person has fewer than 3 bowel movements a week; has difficulty having a bowel movement; or has stools that are dry, hard, or larger than normal. As people grow older, constipation is more common. If you try to fix constipation with medicines that make you have a bowel movement (laxatives), the problem may get worse. Long-term laxative use may cause the muscles of the colon to become weak. A low-fiber diet, not taking in enough fluids, and taking certain medicines may make constipation worse. CAUSES   Certain medicines, such as antidepressants, pain medicine, iron supplements, antacids, and water pills.   Certain diseases, such as diabetes, irritable bowel syndrome (IBS), thyroid disease, or depression.   Not drinking enough water.   Not eating enough fiber-rich foods.   Stress or travel.  Lack of physical activity or exercise.  Not going to the restroom when there is the urge to have a bowel movement.  Ignoring the urge to have a bowel movement.  Using laxatives too much. SYMPTOMS   Having fewer than 3 bowel movements a week.   Straining to have a bowel movement.   Having hard, dry, or larger than normal stools.   Feeling full or bloated.   Pain in the lower abdomen.  Not feeling relief after having a bowel movement. DIAGNOSIS  Your caregiver will take a medical history and perform a physical exam. Further testing may be done for severe constipation. Some tests may include:   A barium enema X-ray to examine your rectum, colon, and sometimes, your small intestine.  A sigmoidoscopy to examine your lower colon.  A colonoscopy to examine your entire colon. TREATMENT  Treatment will depend on the severity of your constipation and what is causing it. Some dietary treatments include drinking more fluids and eating more fiber-rich foods. Lifestyle treatments may include regular exercise. If these diet and lifestyle recommendations  do not help, your caregiver may recommend taking over-the-counter laxative medicines to help you have bowel movements. Prescription medicines may be prescribed if over-the-counter medicines do not work.  HOME CARE INSTRUCTIONS   Increase dietary fiber in your diet, such as fruits, vegetables, whole grains, and beans. Limit high-fat and processed sugars in your diet, such as [place] fries, hamburgers, cookies, candies, and soda.   A fiber supplement may be added to your diet if you cannot get enough fiber from foods.   Drink enough fluids to keep your urine clear or pale yellow.   Exercise regularly or as directed by your caregiver.   Go to the restroom when you have the urge to go. Do not hold it.  Only take medicines as directed by your caregiver. Do not take other medicines for constipation without talking to your caregiver first. SEEK IMMEDIATE MEDICAL CARE IF:   You have bright red blood in your stool.   Your constipation lasts for more than 4 days or gets worse.   You have abdominal or rectal pain.   You have thin, pencil-like stools.  You have unexplained weight loss. MAKE SURE YOU:   Understand these instructions.  Will watch your condition.  Will get help right away if you are not doing well or get worse. Document Released: 05/08/2004 Document Revised: 11/02/2011 Document Reviewed: 07/14/2011 [facility] Patient Information 2014 [place], [place].         Relieve Constipation  Miralax 17grams daily, for 1-4 days, until bowel movement  Increase fluid intake (water) Increase fiber in diet (fruits, vegetables, whole grains) Take stool softner  daily Senekot tablets as directed

## 2013-06-01 ENCOUNTER — Telehealth: Payer: Self-pay | Admitting: General Practice

## 2013-06-02 ENCOUNTER — Emergency Department (HOSPITAL_COMMUNITY)
Admission: EM | Admit: 2013-06-02 | Discharge: 2013-06-02 | Disposition: A | Discharge status: Home / Self Care | Payer: BC Managed Care – PPO | Attending: Emergency Medicine | Admitting: Emergency Medicine

## 2013-06-02 ENCOUNTER — Encounter (HOSPITAL_COMMUNITY): Payer: Self-pay | Admitting: Emergency Medicine

## 2013-06-02 ENCOUNTER — Telehealth: Payer: Self-pay | Admitting: General Practice

## 2013-06-02 DIAGNOSIS — R109 Unspecified abdominal pain: Secondary | ICD-10-CM

## 2013-06-02 DIAGNOSIS — I10 Essential (primary) hypertension: Secondary | ICD-10-CM | POA: Insufficient documentation

## 2013-06-02 DIAGNOSIS — K5900 Constipation, unspecified: Secondary | ICD-10-CM

## 2013-06-02 DIAGNOSIS — Z79899 Other long term (current) drug therapy: Secondary | ICD-10-CM | POA: Insufficient documentation

## 2013-06-02 DIAGNOSIS — R142 Eructation: Secondary | ICD-10-CM | POA: Insufficient documentation

## 2013-06-02 DIAGNOSIS — R141 Gas pain: Secondary | ICD-10-CM | POA: Insufficient documentation

## 2013-06-02 DIAGNOSIS — E785 Hyperlipidemia, unspecified: Secondary | ICD-10-CM | POA: Insufficient documentation

## 2013-06-02 DIAGNOSIS — R1084 Generalized abdominal pain: Secondary | ICD-10-CM | POA: Insufficient documentation

## 2013-06-02 NOTE — ED Provider Notes (Signed)
CSN: [id number]     Arrival date & time 06/02/13  1323 History  This chart was scribed for Ward Givens, MD by Bennett Scrape, ED Scribe. This patient was seen in room APA09/APA09 and the patient's care was started at 2:21 PM.   Chief Complaint  Patient presents with  . Abdominal Pain    The history is provided by the patient. No language interpreter was used.    HPI Comments: Krista Finley is a 62 y.o. female who presents to the Emergency Department complaining of intermittent lower abdominal pain with associated abdominal distention for the past week. She describes the pain as a pressure feeling that is felt intermittently. She states that the episodes last around 30 to 40 minutes at a time. She reports that she was evaluated by her PCP 2 days ago for the same and had a negative UA. Abdomen x-ray showed a moderate amount of constipation and she was advised to take Miralax and Senokot. She states that she has been having BM described as "little balls" for the past few weeks. She denies prior issues with constipation. She denies any new changes in medications or diet. She denies any changes in her appetite. She denies any nausea, emesis and fevers. She states that she took 3 Senokot pills with no improvement. She then took Miralax and had 3 strips of feces. She states that she was then advised by her PCP to try an enema this am. She tried a fleet's enema and had a BM smaller than the size of her fist. She decided to be evaluated today due to continued abdominal pain.  She has a h/o BTL but denies any other major abdominal surgeries. She has a h/o HTN and HLD. Pt is an occasional alcohol (wine) user but denies smoking.   PCP is [facility]    Past Medical History  Diagnosis Date  . Hypertension   . Gastritis   . Hyperlipidemia    Past Surgical History  Procedure Laterality Date  . Elbow surgery Right    Family History  Problem Relation Age of Onset  . Hypertension Mother    . Heart disease Mother   . Heart disease Father     MI   History  Substance Use Topics  . Smoking status: Never Smoker   . Smokeless tobacco: Not on file  . Alcohol Use: No  Currently employed in [employer] Drinks wine occassionally  No OB history provided.   Review of Systems  Constitutional: Negative for fever.  Gastrointestinal: Positive for abdominal pain, constipation and abdominal distention. Negative for nausea, vomiting and diarrhea.  All other systems reviewed and are negative.    Allergies  Review of patient's allergies indicates no known allergies.  Home Medications   Current Outpatient Rx  Name  Route  Sig  Dispense  Refill  . atorvastatin (LIPITOR) 20 MG tablet   Oral   Take 20 mg by mouth daily.         Marland Kitchen olmesartan-hydrochlorothiazide (BENICAR HCT) 40-25 MG per tablet   Oral   Take 1 tablet by mouth daily.          Triage Vitals: BP 161/89  Pulse 84  Temp(Src) 98.4 F (36.9 C) (Oral)  Resp 18  Ht 5\' 4"  (1.626 m)  Wt 149 lb (67.586 kg)  BMI 25.56 kg/m2  SpO2 100%  Vital signs normal     Physical Exam  Nursing note and vitals reviewed. Constitutional: She is oriented to person,  place, and time. She appears well-developed and well-nourished.  Non-toxic appearance. She does not appear ill. No distress.  HENT:  Head: Normocephalic and atraumatic.  Right Ear: External ear normal.  Left Ear: External ear normal.  Nose: Nose normal. No mucosal edema or rhinorrhea.  Mouth/Throat: Oropharynx is clear and moist and mucous membranes are normal. No dental abscesses or uvula swelling.  Eyes: Conjunctivae and EOM are normal. Pupils are equal, round, and reactive to light.  Neck: Normal range of motion and full passive range of motion without pain. Neck supple.  Cardiovascular: Normal rate, regular rhythm and normal heart sounds.  Exam reveals no gallop and no friction rub.   No murmur heard. Pulmonary/Chest: Effort normal and breath sounds  normal. No respiratory distress. She has no wheezes. She has no rhonchi. She has no rales. She exhibits no tenderness and no crepitus.  Abdominal: Soft. Normal appearance and bowel sounds are normal. She exhibits no distension. There is tenderness (mildly tender diffusely). There is no rebound and no guarding.  Musculoskeletal: Normal range of motion. She exhibits no edema and no tenderness.  Moves all extremities well.   Neurological: She is alert and oriented to person, place, and time. She has normal strength. No cranial nerve deficit.  Skin: Skin is warm, dry and intact. No rash noted. No erythema. No pallor.  Psychiatric: She has a normal mood and affect. Her speech is normal and behavior is normal. Her mood appears not anxious.    ED Course  Procedures (including critical care time)  DIAGNOSTIC STUDIES: Oxygen Saturation is 100% on room air, normal by my interpretation.    COORDINATION OF CARE: 2:25 PM-Discussed discharge plan which includes continued Miralax with pt at bedside and pt agreed to plan. Advised pt to follow up with her PCP if symptoms don't improve.    MDM   1. Constipation   2. Abdominal pain      I personally performed the services described in this documentation, which was scribed in my presence. The recorded information has been reviewed and considered.   Devoria Albe, MD, [facility]     Ward Givens, MD 06/02/13 [phone]

## 2013-06-02 NOTE — ED Notes (Addendum)
Pt has been having lower ab pain for 1 week, was seen by her pmd, had xrays on wednesday, told she was constipated and started on meds, cont. To have pain and unable to completely empty bowels. Denies any nausea, vomiting or diarrhea. No fever. Used a Corporate treasurer today w/ small bm.

## 2013-06-02 NOTE — Telephone Encounter (Signed)
Spoke with patient and she was going to try an OTC enema. Informed that a CT scan could be ordered, but should seek emergency medical treatment if symptoms worsen. Patient to call office if no relief.

## 2013-06-05 ENCOUNTER — Telehealth: Payer: Self-pay | Admitting: General Practice

## 2013-06-06 ENCOUNTER — Other Ambulatory Visit: Payer: Self-pay | Admitting: General Practice

## 2013-06-06 DIAGNOSIS — R102 Pelvic and perineal pain: Secondary | ICD-10-CM

## 2013-06-06 DIAGNOSIS — R109 Unspecified abdominal pain: Secondary | ICD-10-CM

## 2013-06-06 NOTE — Telephone Encounter (Signed)
Spoke with patient and referral being made

## 2013-06-06 NOTE — Telephone Encounter (Signed)
CAN WE MAKE REFERRAL

## 2013-06-06 NOTE — Telephone Encounter (Signed)
[phone] is best number to reach patient about referral GI

## 2013-06-07 ENCOUNTER — Ambulatory Visit (HOSPITAL_COMMUNITY)
Admission: RE | Admit: 2013-06-07 | Discharge: 2013-06-07 | Disposition: A | Discharge status: Home / Self Care | Payer: BC Managed Care – PPO | Source: Ambulatory Visit | Attending: General Practice | Admitting: General Practice

## 2013-06-07 DIAGNOSIS — N949 Unspecified condition associated with female genital organs and menstrual cycle: Secondary | ICD-10-CM | POA: Insufficient documentation

## 2013-06-07 DIAGNOSIS — R9389 Abnormal findings on diagnostic imaging of other specified body structures: Secondary | ICD-10-CM | POA: Insufficient documentation

## 2013-06-07 DIAGNOSIS — R109 Unspecified abdominal pain: Secondary | ICD-10-CM | POA: Insufficient documentation

## 2013-06-07 DIAGNOSIS — Q619 Cystic kidney disease, unspecified: Secondary | ICD-10-CM | POA: Insufficient documentation

## 2013-06-07 DIAGNOSIS — N200 Calculus of kidney: Secondary | ICD-10-CM | POA: Insufficient documentation

## 2013-06-07 DIAGNOSIS — R102 Pelvic and perineal pain: Secondary | ICD-10-CM

## 2013-06-08 ENCOUNTER — Telehealth: Payer: Self-pay | Admitting: General Practice

## 2013-06-08 NOTE — Telephone Encounter (Signed)
error 

## 2013-06-09 ENCOUNTER — Other Ambulatory Visit: Payer: Self-pay | Admitting: General Practice

## 2013-06-09 DIAGNOSIS — D219 Benign neoplasm of connective and other soft tissue, unspecified: Secondary | ICD-10-CM

## 2013-06-09 DIAGNOSIS — K5900 Constipation, unspecified: Secondary | ICD-10-CM

## 2013-06-09 NOTE — Telephone Encounter (Signed)
Referral made to GYN and GI

## 2013-06-12 ENCOUNTER — Encounter: Payer: Self-pay | Admitting: Internal Medicine

## 2013-06-19 ENCOUNTER — Other Ambulatory Visit: Payer: Self-pay | Admitting: General Practice

## 2013-06-19 ENCOUNTER — Telehealth: Payer: Self-pay | Admitting: General Practice

## 2013-06-19 NOTE — Telephone Encounter (Signed)
Spoke with patient and she reports having black tarry stools, since yesterday. She reports having a scheduled appointment with Corinda Gubler on November 21. Denies taking iron tablets. She reports having to leave work early today due to stomach cramps, intermittently. Informed patient that she now has an appointment scheduled with [facility] tomorrow at 9:30 with Dr. Rhea Belton and should be there early. Patient aware if symptoms worsen, she may seek emergency medical treatment.

## 2013-06-19 NOTE — Telephone Encounter (Signed)
Patient verbalized understanding that she may seek emergency medical treatment if symptoms worsening and that appointment is scheduled for tomorrow.

## 2013-06-19 NOTE — Telephone Encounter (Signed)
Advise

## 2013-06-20 ENCOUNTER — Telehealth: Payer: Self-pay | Admitting: Gastroenterology

## 2013-06-20 ENCOUNTER — Encounter: Payer: Self-pay | Admitting: Internal Medicine

## 2013-06-20 ENCOUNTER — Other Ambulatory Visit (INDEPENDENT_AMBULATORY_CARE_PROVIDER_SITE_OTHER): Payer: BC Managed Care – PPO

## 2013-06-20 ENCOUNTER — Ambulatory Visit (INDEPENDENT_AMBULATORY_CARE_PROVIDER_SITE_OTHER)
Admission: RE | Admit: 2013-06-20 | Discharge: 2013-06-20 | Disposition: A | Discharge status: Home / Self Care | Payer: BC Managed Care – PPO | Source: Ambulatory Visit | Attending: Internal Medicine | Admitting: Internal Medicine

## 2013-06-20 ENCOUNTER — Ambulatory Visit (INDEPENDENT_AMBULATORY_CARE_PROVIDER_SITE_OTHER): Payer: BC Managed Care – PPO | Admitting: Internal Medicine

## 2013-06-20 VITALS — BP 132/86 | HR 90 | Ht 64.0 in | Wt 150.0 lb

## 2013-06-20 DIAGNOSIS — R109 Unspecified abdominal pain: Secondary | ICD-10-CM

## 2013-06-20 DIAGNOSIS — I10 Essential (primary) hypertension: Secondary | ICD-10-CM | POA: Insufficient documentation

## 2013-06-20 DIAGNOSIS — K5900 Constipation, unspecified: Secondary | ICD-10-CM

## 2013-06-20 DIAGNOSIS — R1031 Right lower quadrant pain: Secondary | ICD-10-CM

## 2013-06-20 LAB — URINALYSIS WITH CULTURE, IF INDICATED
Bilirubin Urine: NEGATIVE
Nitrite: NEGATIVE
Total Protein, Urine: NEGATIVE
Urine Glucose: NEGATIVE
Urobilinogen, UA: 0.2 (ref 0.0–1.0)
pH: 8 (ref 5.0–8.0)

## 2013-06-20 LAB — COMPREHENSIVE METABOLIC PANEL
AST: 18 U/L (ref 0–37)
Albumin: 3.9 g/dL (ref 3.5–5.2)
Alkaline Phosphatase: 89 U/L (ref 39–117)
CO2: 30 mEq/L (ref 19–32)
Glucose, Bld: 86 mg/dL (ref 70–99)
Potassium: 4.1 mEq/L (ref 3.5–5.1)
Sodium: 140 mEq/L (ref 135–145)
Total Bilirubin: 0.5 mg/dL (ref 0.3–1.2)
Total Protein: 7.1 g/dL (ref 6.0–8.3)

## 2013-06-20 LAB — CBC
HCT: 38.1 % (ref 36.0–46.0)
MCHC: 34.5 g/dL (ref 30.0–36.0)
MCV: 88.4 fl (ref 78.0–100.0)
RBC: 4.31 Mil/uL (ref 3.87–5.11)
WBC: 3.8 10*3/uL — ABNORMAL LOW (ref 4.5–10.5)

## 2013-06-20 MED ORDER — HYDROCODONE-ACETAMINOPHEN 5-325 MG PO TABS: 1.0000 | ORAL_TABLET | Freq: Four times a day (QID) | ORAL | Status: DC | PRN

## 2013-06-20 MED ORDER — IOHEXOL 300 MG/ML  SOLN
100.0000 mL | Freq: Once | INTRAMUSCULAR | Status: AC | PRN
  Administered 2013-06-20: 100 mL via INTRAVENOUS

## 2013-06-20 MED ORDER — LINACLOTIDE 145 MCG PO CAPS: 145.0000 ug | ORAL_CAPSULE | Freq: Every day | ORAL | Status: DC

## 2013-06-20 MED ORDER — METRONIDAZOLE 500 MG PO TABS: 500.0000 mg | ORAL_TABLET | Freq: Three times a day (TID) | ORAL | Status: DC

## 2013-06-20 MED ORDER — CIPROFLOXACIN HCL 500 MG PO TABS: 500.0000 mg | ORAL_TABLET | Freq: Two times a day (BID) | ORAL | Status: DC

## 2013-06-20 NOTE — Telephone Encounter (Signed)
Spoke to pt. Told her dr. Sherald Barge recommendations pt verbalized understanding

## 2013-06-20 NOTE — Patient Instructions (Signed)
You have been scheduled for a CT scan of the abdomen and pelvis at [facility] ([address]---this is in the same building as [facility]).   You are scheduled on 06/20/2013 at AS SOON AS YOU LEAVE HERE. You should arrive 15 minutes prior to your appointment time for registration. Please follow the written instructions below on the day of your exam:  WARNING: IF YOU ARE ALLERGIC TO IODINE/X-RAY DYE, PLEASE NOTIFY RADIOLOGY IMMEDIATELY AT [phone]! YOU WILL BE GIVEN A 13 HOUR PREMEDICATION PREP.  1) Do not eat or drink anything until after your CT     You will be given a  Contrast to drink once you get to [facility]  You may take any medications as prescribed with a small amount of water except for the following: Metformin, Glucophage, Glucovance, Avandamet, Riomet, Fortamet, Actoplus Met, Janumet, Glumetza or Metaglip. The above medications must be held the day of the exam AND 48 hours after the exam.  If you have any questions regarding your exam or if you need to reschedule, you may call the CT department at [phone] between the hours of 8:00 am and 5:00 pm, Monday-Friday.   Your physician has requested that you go to the basement for the following lab work before leaving today:  CBC, CMP  We have sent the following medications to your pharmacy for you to pick up at your convenience:  Cipro and Flagyl, please take as directed You have been given a prescription for Vicodin, 1 tabler every 4-6 hours as neededfor pain.                                               We are excited to introduce MyChart, a new best-in-class service that provides you online access to important information in your electronic medical record. We want to make it easier for you to view your health information - all in one secure location - when and where you need it. We expect MyChart will enhance the quality of care and service we provide.  When you register for MyChart, you can:    View your test results.    Request appointments and receive appointment reminders via email.    Request medication renewals.    View your medical history, allergies, medications and immunizations.    Communicate with your physician's office through a password-protected site.    Conveniently print information such as your medication lists.  To find out if MyChart is right for you, please talk to a member of our clinical staff today. We will gladly answer your questions about this free health and wellness tool.  If you are age 57 or older and want a member of your family to have access to your record, you must provide written consent by completing a proxy form available at our office. Please speak to our clinical staff about guidelines regarding accounts for patients younger than age 21.  As you activate your MyChart account and need any technical assistance, please call the MyChart technical support line at [phone]) or email your question to [email or web address]. If you email your question(s), please include your name, a return phone number and the best time to reach you.  If you have non-urgent health-related questions, you can send a message to our office through MyChart at [email or web address]. If you have a medical emergency, call  911.  Thank you for using MyChart as your new health and wellness resource!   MyChart licensed from [employer],  [id number]. Patents Pending.    ________________________________________________________________________

## 2013-06-20 NOTE — Progress Notes (Signed)
Patient ID: Krista Finley, female   DOB: 1951-01-23, 62 y.o.   MRN: [id number] HPI: Krista Finley is a 62 yo female with PMH of hypertension and hyperlipidemia who is seen in consultation at the request of Dr. Christell Constant for right lower quadrant abdominal pain. The patient reports that over the last 6 months she's had on and off issues with right lower quadrant pressure and suprapubic abdominal pain. Over the last several weeks it has been more constant in nature and more severe. She reports previously she had an ER visit for the same pain and was told this may be secondary to constipation. She was given MiraLax initially daily with an enema, but without response this was increased to MiraLax every 30 minutes until she had a bowel movement. With bowel movement the pain he stopped it has returned. She reports she is having a daily bowel movement which is soft. She is using Tylenol PM at night to help her sleep. She does report several episodes of black stool yesterday. She denies urinary complaint including dysuria, hematuria or frequency. She has never had a colonoscopy. She reports some nausea and decreased appetite but no vomiting. Denies heartburn, dysphagia or odynophagia. Constipation is not long-standing for her. She denies fevers or chills.  About 2 weeks ago she had a CT scan which was reportedly unremarkable. The pain has worsened since then  Past Medical History  Diagnosis Date  . Hypertension   . Gastritis   . Hyperlipidemia     Past Surgical History  Procedure Laterality Date  . Elbow surgery Right     Current Outpatient Prescriptions  Medication Sig Dispense Refill  . atorvastatin (LIPITOR) 20 MG tablet Take 20 mg by mouth daily.      Marland Kitchen olmesartan-hydrochlorothiazide (BENICAR HCT) 40-25 MG per tablet Take 1 tablet by mouth daily.      . ciprofloxacin (CIPRO) 500 MG tablet Take 1 tablet (500 mg total) by mouth 2 (two) times daily. Take 1 tablet twice a day for 10 days  20 tablet  0  .  HYDROcodone-acetaminophen (NORCO/VICODIN) 5-325 MG per tablet Take 1 tablet by mouth every 6 (six) hours as needed for pain.  30 tablet  0  . metroNIDAZOLE (FLAGYL) 500 MG tablet Take 1 tablet (500 mg total) by mouth 3 (three) times daily. Take one tablet 3 times a day for 10 days  30 tablet  0   No current facility-administered medications for this visit.    No Known Allergies  Family History  Problem Relation Age of Onset  . Hypertension Mother   . Heart disease Mother   . Heart disease Father     MI    History  Substance Use Topics  . Smoking status: Never Smoker   . Smokeless tobacco: Not on file  . Alcohol Use: No    ROS: As per history of present illness, otherwise negative  BP 132/86  Pulse 90  Ht 5\' 4"  (1.626 m)  Wt 150 lb (68.04 kg)  BMI 25.73 kg/m2 Constitutional: Well-developed and well-nourished. No distress, but in obvious discomfort somewhat leaning over in her chair HEENT: Normocephalic and atraumatic. Oropharynx is clear and moist. No oropharyngeal exudate. Conjunctivae are normal.  No scleral icterus. Neck: Neck supple. Trachea midline. Cardiovascular: Normal rate, regular rhythm and intact distal pulses.  Pulmonary/chest: Effort normal and breath sounds normal. No wheezing, rales or rhonchi. Abdominal: Soft, moderate right lower quadrant and central lower abdominal pain to palpation with some rebound tenderness, no guarding, nondistended.  Bowel sounds active throughout. Rectal: soft, dark brown heme neg stool in the rectal vault, nontender, no masses Extremities: no clubbing, cyanosis, or edema Lymphadenopathy: No cervical adenopathy noted. Neurological: Alert and oriented to person place and time. Skin: Skin is warm and dry. No rashes noted. Psychiatric: Normal mood and affect. Behavior is normal.  RELEVANT LABS AND IMAGING: CBC    Component Value Date/Time   WBC 3.8* 06/20/2013 1039   RBC 4.31 06/20/2013 1039   HGB 13.1 06/20/2013 1039   HCT 38.1  06/20/2013 1039   PLT 250.0 06/20/2013 1039   MCV 88.4 06/20/2013 1039   MCH 30.9 09/17/2012 0825   MCHC 34.5 06/20/2013 1039   RDW 13.4 06/20/2013 1039   LYMPHSABS 3.0 12/22/2010 0419   MONOABS 0.5 12/22/2010 0419   EOSABS 0.1 12/22/2010 0419   BASOSABS 0.0 12/22/2010 0419    CMP     Component Value Date/Time   NA 140 06/20/2013 1039   K 4.1 06/20/2013 1039   CL 103 06/20/2013 1039   CO2 30 06/20/2013 1039   GLUCOSE 86 06/20/2013 1039   BUN 6 06/20/2013 1039   CREATININE 0.8 06/20/2013 1039   CALCIUM 9.3 06/20/2013 1039   PROT 7.1 06/20/2013 1039   ALBUMIN 3.9 06/20/2013 1039   AST 18 06/20/2013 1039   ALT 12 06/20/2013 1039   ALKPHOS 89 06/20/2013 1039   BILITOT 0.5 06/20/2013 1039   GFRNONAA >90 09/17/2012 0825   GFRAA >90 09/17/2012 0825   CT ABDOMEN AND PELVIS WITH CONTRAST -- today   TECHNIQUE: Multidetector CT imaging of the abdomen and pelvis was performed using the standard protocol following bolus administration of intravenous contrast.   CONTRAST:  OMNIPAQUE IOHEXOL 300 MG/ML  SOLN   COMPARISON:  CT ABD/PELV WO CM dated 06/07/2013   FINDINGS: Lung bases are clear. No pericardial fluid.   No focal hepatic lesion. Gallbladder, pancreas, spleen, adrenal glands, and kidneys are normal. There is a small nonenhancing cysts within the left renal cortex.   Stomach, small bowel, appendix, cecum are normal. The colon and rectosigmoid colon are normal. Moderate volume of stool throughout the colon.   Abdominal aorta is normal in caliber. No retroperitoneal or periportal lymphadenopathy. . No pelvic lymphadenopathy. Uterus and adnexa demonstrate no acute findings. The bladder is normal.   Review of bone windows demonstrates no aggressive osseous lesions.   IMPRESSION: 1. Normal appendix. 2. Moderate volume stool throughout the colon. 3. No acute findings.  ASSESSMENT/PLAN: 62 yo female with PMH of hypertension and hyperlipidemia who is seen in  consultation at the request of Dr. Christell Constant for right lower quadrant abdominal pain.\  1.  Moderate to severe right lower quadrant pain -- my initial concern was right-sided diverticulitis or appendicitis. CBC was performed which did not reveal a significant leukocytosis nor anemia. Urgent CT scan was also performed which showed a normal appendix and no evidence for diverticulitis. She did have moderate volume of stool throughout the colon. Initially I had prescribed ciprofloxacin and tinnitus all for diverticulitis at the time of her office visit. At this point, I will contact her and ask her to not take antibiotics as previously recommended based on the CT findings and the lack of diverticulitis. I would like her to resume MiraLax which she can take as she did before every 30 minutes until she has a bowel movement. I would then like to start her on Linzess 145 mcg daily to avoid constipation. Hopefully this will improve her abdominal pain. Also, I  recommended a colonoscopy to further evaluate her pain and also for screening. We discussed this test in clinic and she was agreeable to proceed.

## 2013-06-21 ENCOUNTER — Telehealth: Payer: Self-pay | Admitting: Gastroenterology

## 2013-06-21 ENCOUNTER — Telehealth: Payer: Self-pay | Admitting: Internal Medicine

## 2013-06-21 NOTE — Telephone Encounter (Signed)
lvm for pt to call me back. Sr. Pyrtle recommends she have a colonoscopy. She will need to come in for a pre-visit as well.

## 2013-06-21 NOTE — Telephone Encounter (Signed)
No way to leave a VM. Pt seen yesterday by Dr Rhea Belton for RLQ pain and constipation. CT was done showing normal appendix and no diverticulosis. Pt was to resume Miralax and take q 30 minutes until a BM, then Linzess 145 mcg daily.

## 2013-06-22 MED ORDER — HYOSCYAMINE SULFATE 0.125 MG SL SUBL: SUBLINGUAL_TABLET | SUBLINGUAL | Status: DC

## 2013-06-22 NOTE — Telephone Encounter (Signed)
Gave pt Dr Lauro Franklin instructions/recommendations and ordered Levsin. Pt will call me with a report and we will discuss a possible colon then.

## 2013-06-22 NOTE — Telephone Encounter (Signed)
Notes Recorded by Beverley Fiedler, MD on 06/20/2013 at 4:14 PM CT scan does not show appendicitis or diverticulitis Hold antibiotics, begin laxatives as previously recommended Outpatient colonoscopy      Pt called back today; couldn't reach her yesterday. She reports her stomach is still hurting, when she walks she is bent over. She reports 2-3 loose BMs daily. Please advise.

## 2013-06-22 NOTE — Telephone Encounter (Signed)
Begin Linzess 145 mcg daily Can use levsin 0.25 mg subling every 4-6 hours PRN Also can use hydrocodone 5/APAP 325 mg every 4-6 as needed. I recommend colonoscopy also, which she did not wish to schedule when called back with initial CT results (by Adonis Housekeeper) Check on tomorrow and if pain not better tomorrow can be seen in urgent visit

## 2013-06-23 ENCOUNTER — Ambulatory Visit (INDEPENDENT_AMBULATORY_CARE_PROVIDER_SITE_OTHER): Payer: BC Managed Care – PPO | Admitting: Physician Assistant

## 2013-06-23 ENCOUNTER — Encounter: Payer: Self-pay | Admitting: Physician Assistant

## 2013-06-23 VITALS — BP 136/80 | HR 76 | Temp 98.5°F | Ht 64.0 in | Wt 147.0 lb

## 2013-06-23 DIAGNOSIS — M545 Low back pain, unspecified: Secondary | ICD-10-CM

## 2013-06-23 DIAGNOSIS — R1031 Right lower quadrant pain: Secondary | ICD-10-CM

## 2013-06-23 MED ORDER — TRAMADOL HCL 50 MG PO TABS: ORAL_TABLET | ORAL | Status: DC

## 2013-06-23 NOTE — Patient Instructions (Signed)
You have been scheduled for a CT scan of the back at [facility] [place], [place]).   You are scheduled on 06/26/2013 at 12:45pm. You should arrive 15 minutes prior to your appointment time for registration. Please follow the written instructions below on the day of your exam:  WARNING: IF YOU ARE ALLERGIC TO IODINE/X-RAY DYE, PLEASE NOTIFY RADIOLOGY IMMEDIATELY AT [phone]! YOU WILL BE GIVEN A 13 HOUR PREMEDICATION PREP.  1) Do not eat or drink anything after 8:45am (4 hours prior to your test)  You may take any medications as prescribed with a small amount of water except for the following: Metformin, Glucophage, Glucovance, Avandamet, Riomet, Fortamet, Actoplus Met, Janumet, Glumetza or Metaglip. The above medications must be held the day of the exam AND 48 hours after the exam. ________________________________________________________________________   We have sent the following medications to your pharmacy for you to pick up at your convenience:  Ultram    Go home and rest.  You may use a heating pad on your back and lower abdomen over the weekend.

## 2013-06-23 NOTE — Telephone Encounter (Signed)
Spoke with pt to see if the Levsin helped and her pain is better. Pt reports Levsin worked for the pain, but then wears off and she has to take more. The pain med makes her too dizzy so she only takes it when she is going to bed. I asked if she wanted to schedule the COLON and she stated if that's what it takes. She states something has to be done about the pain. Offered her an appt and she will see Mike Gip, PA at 1:30pm. She has had a CT with stool burden, but she states she is having BMs.

## 2013-06-23 NOTE — Progress Notes (Addendum)
Subjective:    Patient ID: Krista Finley, female    DOB: [birth year], 62 y.o.   MRN: [id number]  HPI  Krista Finley is a pleasant 62 year old female recently establish with Dr. Rhea Belton who was just seen for the first time on 06/20/2013. She was referred by Dr. Vernon Prey for evaluation of right lower quadrant abdominal pain. She had stated that over the past few months she had had intermittent difficulty with right lower quadrant pressure and suprapubic abdominal pain. Now over the past 3-4 weeks has had progressive difficulty and is now having constant pain. She had had an ER visit and was told #3that her pain may be secondary to constipation. She had taken MiraLax and was able to have bowel movements, however she tells me that she's not sure that that made much difference in her pain. She was having significant pain when seen on the 28th and underwent an urgent CT scan of the abdomen and pelvis which was unremarkable specifically with a normal appendix and no evidence for any inflammatory process in the right lower quadrant she did have a moderate volume of stool throughout the colon. She also had labs done including a UA  which were unrevealing. Patient had also had CT scan of the abdomen and pelvis earlier in October for this same pain and was felt to have a very small right renal stone but no hydronephrosis. There was no stone noted on his followup CT. Patient has been taking Linzess145 mcg daily over this past week and says that she is having bowel movements and does not feel the constipation is her problem. She comes back today stating that her pain is constant in very uncomfortable to is unable to work today and she is having difficulty sleeping. She describes it as a pressure sensation which is sharp at times. She says the pain is now in her right lower quadrant and around into her right back. She denies any radiation into her leg. She's not had any fever or chills no dysuria urgency or frequency. No  change in pain with by mouth intake though her appetite has been decreased. She denies any prior back problems or disc problems and has not had any injury that she is aware of. She says her most comfortable position is lying flat. Patient had been given a prescription for Vicodin however says this makes her dizzy.     Review of Systems  Constitutional: Positive for activity change.  HENT: Negative.   Eyes: Negative.   Respiratory: Negative.   Cardiovascular: Negative.   Gastrointestinal: Positive for abdominal pain and constipation.  Endocrine: Negative.   Genitourinary: Negative.   Musculoskeletal: Positive for back pain.  Skin: Negative.   Allergic/Immunologic: Negative.   Neurological: Negative.   Hematological: Negative.   Psychiatric/Behavioral: Negative.    Outpatient Prescriptions Prior to Visit  Medication Sig Dispense Refill  . atorvastatin (LIPITOR) 20 MG tablet Take 20 mg by mouth daily.      Marland Kitchen HYDROcodone-acetaminophen (NORCO/VICODIN) 5-325 MG per tablet Take 1 tablet by mouth every 6 (six) hours as needed for pain.  30 tablet  0  . hyoscyamine (LEVSIN SL) 0.125 MG SL tablet Place one tablet under tongue every 4-6 hours as needed for abdominal pain/cramping.  30 tablet  0  . Linaclotide (LINZESS) 145 MCG CAPS capsule Take 1 capsule (145 mcg total) by mouth daily.  30 capsule  3  . olmesartan-hydrochlorothiazide (BENICAR HCT) 40-25 MG per tablet Take 1 tablet by mouth daily.      Marland Kitchen  ciprofloxacin (CIPRO) 500 MG tablet Take 1 tablet (500 mg total) by mouth 2 (two) times daily. Take 1 tablet twice a day for 10 days  20 tablet  0  . metroNIDAZOLE (FLAGYL) 500 MG tablet Take 1 tablet (500 mg total) by mouth 3 (three) times daily. Take one tablet 3 times a day for 10 days  30 tablet  0   No facility-administered medications prior to visit.   family history includes Heart disease in her father and mother; Hypertension in her mother; Stomach cancer in her maternal aunt. There is  no history of Colon cancer.     History  Substance Use Topics  . Smoking status: Never Smoker   . Smokeless tobacco: Never Used  . Alcohol Use: No   Patient Active Problem List   Diagnosis Date Noted  . HTN (hypertension) 06/20/2013   Objective:   Physical Exam    WD AA female in NAD. Uncomfortable appearing with sitting. Blood pressure 136 were 80 pulse 76 temp 98 5 height 5 foot 4 weight 147. HEENT ;nontraumatic normocephalic EOMI PERRLA sclera anicteric, Supple; no JVD, Cardiovascular; regular rate and rhythm with S1-S2 no murmur or gallop, Pulm;clear bilaterally, Abdomen; soft bowel sounds are present there is no palpable mass or hepatosplenomegaly she is tender in the right lower quadrant and along the right iliac crest and into the right back positive straight leg test with increased back pain , there is no visible rash., Rectal; not done, Extremities no clubbing cyanosis or edema skin warm and dry, Psych mood and affect appropriate      Assessment & Plan:   #45  62 year old female with progressive right lower quadrant and now right back pain over the past 3 to four-week 6 back pain has started over the past few days and is constant. Initial GI workup with labs and CT of the abdomen and pelvis have been unremarkable. Her exam is more consistent with a musculoskeletal pain or perhaps a radiculopathy. Will rule out disc disease with nerve compression,, compression fracture or a muscle strain. #2 colon neoplasia surveillance-patient will need a colonoscopy however with negative CT scan feel that  A colonic lesion is less likely a cause for her acute pain, and will r/o other etiologies first.   Plan;  Home to rest, note for work x 3 days, moist heat to RLQ and Right back Trial of Ultram 50 mg  q 6 hours for pain instead of hydrocodone which causes dizziness Stop Levsin- not helping Continue Linzess one daily  Schedule for C T of L-S spine in next few days Eventual  colonoscopy   Addendum: Reviewed and agree with management. Could also consider T and L spine MRI if radiology feels this would be more helpful than CT (and can be performed in timely fashion) Beverley Fiedler, MD

## 2013-06-26 ENCOUNTER — Telehealth: Payer: Self-pay | Admitting: *Deleted

## 2013-06-26 ENCOUNTER — Other Ambulatory Visit: Payer: BC Managed Care – PPO

## 2013-06-26 DIAGNOSIS — M549 Dorsalgia, unspecified: Secondary | ICD-10-CM

## 2013-06-26 DIAGNOSIS — R1031 Right lower quadrant pain: Secondary | ICD-10-CM

## 2013-06-26 NOTE — Telephone Encounter (Signed)
The earliest appt I could get is at [facility]. Pt has been given instructions and appt info for 06/28/13 at 1:15PM, [facility]. Pt stated understanding.

## 2013-06-26 NOTE — Telephone Encounter (Signed)
Message copied by Florene Glen on Mon Jun 26, 2013 11:10 AM ------      Message from: Mike Gip S      Created: Mon Jun 26, 2013 10:51 AM      Regarding: Cancel Ct order MRI       Please cancel CT of L-S spine on this pt- let her know her insurance company suggests   MRI. Please order MRI of Thoracic and L-S spine for this week ASAP- having severe right back and RLQ pain-thanks ------

## 2013-06-28 ENCOUNTER — Ambulatory Visit (INDEPENDENT_AMBULATORY_CARE_PROVIDER_SITE_OTHER)
Admission: RE | Admit: 2013-06-28 | Discharge: 2013-06-28 | Disposition: A | Discharge status: Home / Self Care | Payer: BC Managed Care – PPO | Source: Ambulatory Visit | Attending: Physician Assistant | Admitting: Physician Assistant

## 2013-06-28 ENCOUNTER — Telehealth: Payer: Self-pay | Admitting: Internal Medicine

## 2013-06-28 DIAGNOSIS — M545 Low back pain, unspecified: Secondary | ICD-10-CM

## 2013-06-28 DIAGNOSIS — M533 Sacrococcygeal disorders, not elsewhere classified: Secondary | ICD-10-CM

## 2013-06-28 DIAGNOSIS — R1031 Right lower quadrant pain: Secondary | ICD-10-CM

## 2013-06-28 NOTE — Telephone Encounter (Signed)
I have given the PEEP to PEER review to Mike Gip, PA for the MRI and I have faxed a work excuse to [phone].

## 2013-06-28 NOTE — Telephone Encounter (Signed)
Amy Oswald Hillock, PA-C Linna Hoff, RN            Please call pt,- let her know her insurance company will not approve an MRI.... Not until she has had 4-6 weeks of conservative treatment... I would get L-S spine films , and get her back to her primary MD, as suspect this is a musculoskeletal pain.      Amy Oswald Hillock, PA-C Linna Hoff, RN            Please call pt,- let her know her insurance company will not approve an MRI.... Not until she has had 4-6 weeks of conservative treatment... I would get L-S spine films , and get her back to her primary MD, as suspect this is a musculoskeletal pain.      Informed pt to come to the basement here for xrays and we will instruct after the results are back. Pt stated understanding.

## 2013-06-28 NOTE — Telephone Encounter (Signed)
Pt reports [facility] informed her the MRI has not been approved. Ruffin Pyo will call the insurance company. Explained to pt and she will call me with a fax number so I can send a work notice in.

## 2013-06-29 ENCOUNTER — Other Ambulatory Visit: Payer: Self-pay | Admitting: General Practice

## 2013-06-29 ENCOUNTER — Ambulatory Visit (INDEPENDENT_AMBULATORY_CARE_PROVIDER_SITE_OTHER): Payer: BC Managed Care – PPO | Admitting: General Practice

## 2013-06-29 ENCOUNTER — Encounter: Payer: Self-pay | Admitting: General Practice

## 2013-06-29 ENCOUNTER — Ambulatory Visit (INDEPENDENT_AMBULATORY_CARE_PROVIDER_SITE_OTHER): Payer: BC Managed Care – PPO

## 2013-06-29 VITALS — BP 159/88 | HR 81 | Temp 98.5°F | Ht 64.0 in | Wt 149.0 lb

## 2013-06-29 DIAGNOSIS — R1031 Right lower quadrant pain: Secondary | ICD-10-CM

## 2013-06-29 DIAGNOSIS — D219 Benign neoplasm of connective and other soft tissue, unspecified: Secondary | ICD-10-CM

## 2013-06-29 DIAGNOSIS — M545 Low back pain, unspecified: Secondary | ICD-10-CM

## 2013-06-29 DIAGNOSIS — D259 Leiomyoma of uterus, unspecified: Secondary | ICD-10-CM

## 2013-06-29 MED ORDER — IBUPROFEN 800 MG PO TABS: 800.0000 mg | ORAL_TABLET | Freq: Three times a day (TID) | ORAL | Status: DC | PRN

## 2013-06-29 NOTE — Telephone Encounter (Signed)
She needs to see her primary or an orthopedist for any further eval of right back pain. Needs screening colonoscopy.  Informed pt the films showed nothing; we really need an MRI , but insurance will not approve it. Amy Esterwood, PA would like for her to go back to her PCP or an orthopedist. She really needs a return to work note. We discussed that we can write something for abdominal pain, but it's her back pain putting limits on her work. She states she cannot work with restrictions; company won't allow it.  I will send a note to work with her PV and COLON appt, but she nneds her PCP to write the rest. Pt stated understanding.

## 2013-06-29 NOTE — Progress Notes (Signed)
  Subjective:    Patient ID: Krista Finley, female    DOB: 12-30-1950, 62 y.o.   MRN: [id number]  HPI Patient presents today with complaint of right lower quadrant and low back pain, that hasn't resolved. She was referred to GI and has a colonoscopy scheduled for 11/17. She was also referred to GYN, due to fibroid noted on CT scan. Patient reports she was contacted for appointment time, but didn't schedule appointment because of wanting colonoscopy completed first. She reports taking tramadol at night for pain, but makes her too sleepy to take during the day.     Review of Systems  Constitutional: Negative for fever and chills.  Respiratory: Negative for chest tightness and shortness of breath.   Cardiovascular: Negative for chest pain and palpitations.  Gastrointestinal: Positive for abdominal pain. Negative for nausea, vomiting, diarrhea, constipation, blood in stool and abdominal distention.  Genitourinary: Positive for pelvic pain. Negative for frequency, hematuria and difficulty urinating.       Right lower quad pain  Musculoskeletal: Positive for back pain.       Right low back pain  Neurological: Negative for dizziness, weakness, numbness and headaches.       Objective:   Physical Exam  Constitutional: She is oriented to person, place, and time. She appears well-developed and well-nourished.  Cardiovascular: Normal rate, regular rhythm and normal heart sounds.   Pulmonary/Chest: Effort normal and breath sounds normal. No respiratory distress. She exhibits no tenderness.  Abdominal: Soft. Bowel sounds are normal. She exhibits no distension. There is tenderness in the right lower quadrant. There is no CVA tenderness.  Neurological: She is alert and oriented to person, place, and time.  Skin: Skin is warm and dry.  Psychiatric: She has a normal mood and affect.      [facility] reading (PRIMARY) by Coralie Keens, FNP-C, moderate amount stool noted in descending colon.                                 Assessment & Plan:  1. RLQ abdominal pain and 2. Fibroid, uterine  - DG Abd 1 View; Future - ibuprofen (ADVIL,MOTRIN) 800 MG tablet; Take 1 tablet (800 mg total) by mouth every 8 (eight) hours as needed.  Dispense: 30 tablet; Refill: 0 - Ambulatory referral to Gynecology  3. Right low back pain  - DG Abd 1 View; Future - ibuprofen (ADVIL,MOTRIN) 800 MG tablet; Take 1 tablet (800 mg total) by mouth every 8 (eight) hours as needed.  Dispense: 30 tablet; Refill: 0 -may seek emergency medical treatment if symptoms worsen -will refer to consider referral to ortho if no improvement  -maintain scheduled appointments -Patient verbalized understanding -Coralie Keens, FNP-C

## 2013-06-29 NOTE — Patient Instructions (Signed)
Uterine Fibroid °A uterine fibroid is a growth (tumor) that occurs in a woman's uterus. This type of tumor is not cancerous and does not spread out of the uterus. A woman can have one or many fibroids, and the fiboid(s) can become quite large. A fibroid can vary in size, weight, and where it grows in the uterus. Most fibroids do not require medical treatment, but some can cause pain or heavy bleeding during and between periods. °CAUSES  °A fibroid is the result of a single uterine cell that keeps growing (unregulated), which is different than most cells in the human body. Most cells have a control mechanism that keeps them from reproducing without control.  °SYMPTOMS  °· Bleeding. °· Pelvic pain and pressure. °· Bladder problems due to the size of the fibroid. °· Infertility and miscarriages depending on the size and location of the fibroid. °DIAGNOSIS  °A diagnosis is made by physical exam. Your caregiver may feel the lumpy tumors during a pelvic exam. Important information regarding size, location, and number of tumors can be gained by having an ultrasound. It is rare that other tests, such as a CT scan or MRI, are needed. °TREATMENT  °· Your caregiver may recommend watchful waiting. This involves getting the fibroid checked by your caregiver to see if the fibroids grow or shrink.   °· Hormonal treatment or an intrauterine device (IUD) may be prescribed.   °· Surgery may be needed to remove the fibroids (myomectomy) or the uterus (hysterectomy). This depends on your situation. °When fibroids interfere with fertility and a woman wants to become pregnant, a caregiver may recommend having the fibroids removed.  °HOME CARE INSTRUCTIONS  °Home care depends on how you were treated. In general:  °· Keep all follow-up appointments with your caregiver.   °· Only take medicine as told by your caregiver. Do not take aspirin. It can cause bleeding.   °· If you have excessive periods and soak tampons or pads in a half hour or  less, contact your caregiver immediately. If your periods are troublesome but not so heavy, lie down with your feet raised slightly above your heart. Place cold packs on your lower abdomen.   °· If your periods are heavy, write down the number of pads or tampons you use per month. Bring this information to your caregiver.   °· Talk to your caregiver about taking iron pills.    °· Include green vegetables in your diet.   °· If you were prescribed a hormonal treatment, take the hormonal medicines as directed.   °· If you need surgery, ask your caregiver for information on your specific surgery.   °SEEK IMMEDIATE MEDICAL CARE IF: °· You have pelvic pain or cramps not controlled with medicines.   °· You have a sudden increase in pelvic pain.   °· You have an increase of bleeding between and during periods.   °· You feel lightheaded or have fainting episodes.   °MAKE SURE YOU: °· Understand these instructions. °· Will watch your condition. °· Will get help right away if you are not doing well or get worse. °Document Released: 08/07/2000 Document Revised: 11/02/2011 Document Reviewed: 03/09/2013 °ExitCare® Patient Information ©2014 [employer]. ° °

## 2013-07-06 ENCOUNTER — Telehealth: Payer: Self-pay | Admitting: *Deleted

## 2013-07-06 ENCOUNTER — Ambulatory Visit (AMBULATORY_SURGERY_CENTER): Payer: Self-pay

## 2013-07-06 ENCOUNTER — Encounter: Payer: Self-pay | Admitting: Internal Medicine

## 2013-07-06 VITALS — Ht 64.0 in | Wt 149.0 lb

## 2013-07-06 DIAGNOSIS — R1031 Right lower quadrant pain: Secondary | ICD-10-CM

## 2013-07-06 MED ORDER — MOVIPREP 100 G PO SOLR: 1.0000 | Freq: Once | ORAL | Status: DC

## 2013-07-06 NOTE — Telephone Encounter (Signed)
Beverley Fiedler, MD Amy Oswald Hillock, PA-C Cc: Linna Hoff, RN            I would like to pursue colonoscopy once she feels okay to complete the prep.  Aram Beecham  Please work to arrange screening colon  JMP

## 2013-07-10 ENCOUNTER — Encounter: Payer: Self-pay | Admitting: Internal Medicine

## 2013-07-10 ENCOUNTER — Ambulatory Visit (AMBULATORY_SURGERY_CENTER): Payer: BC Managed Care – PPO | Admitting: Internal Medicine

## 2013-07-10 VITALS — BP 152/79 | HR 73 | Temp 97.2°F | Resp 16 | Ht 64.0 in | Wt 149.0 lb

## 2013-07-10 DIAGNOSIS — R1031 Right lower quadrant pain: Secondary | ICD-10-CM

## 2013-07-10 DIAGNOSIS — Z1211 Encounter for screening for malignant neoplasm of colon: Secondary | ICD-10-CM

## 2013-07-10 MED ORDER — SODIUM CHLORIDE 0.9 % IV SOLN: 500.0000 mL | INTRAVENOUS | Status: DC

## 2013-07-10 NOTE — Op Note (Signed)
[facility] [address] [place], [address]   COLONOSCOPY PROCEDURE REPORT  PATIENT: Krista, Finley.  MR#: [id number] BIRTHDATE: 1951-07-09 , 62  yrs. old GENDER: Female ENDOSCOPIST: Beverley Fiedler, MD REFERRED NF:[id number] Christell Constant, M.D. PROCEDURE DATE:  07/10/2013 PROCEDURE:   Colonoscopy, screening First Screening Colonoscopy - Avg.  risk and is 50 yrs.  old or older Yes.  Prior Negative Screening - Now for repeat screening. N/A  History of Adenoma - Now for follow-up colonoscopy & has been > or = to 3 yrs.  N/A  Polyps Removed Today? No.  Recommend repeat exam, <10 yrs? No. ASA CLASS:   Class II INDICATIONS:average risk screening, first colonoscopy, and abdominal pain in the lower right quadrant. MEDICATIONS: MAC sedation, administered by CRNA and propofol (Diprivan) 200mg  IV  DESCRIPTION OF PROCEDURE:   After the risks benefits and alternatives of the procedure were thoroughly explained, informed consent was obtained.  A digital rectal exam revealed no rectal mass.   The LB [id number]  endoscope was introduced through the anus and advanced to the terminal ileum which was intubated for a short distance. No adverse events experienced.   The quality of the prep was good, using MoviPrep  The instrument was then slowly withdrawn as the colon was fully examined.   COLON FINDINGS: The mucosa appeared normal in the terminal ileum. Mild diverticulosis was noted in the sigmoid colon.   The colon mucosa was otherwise normal.  Retroflexed views revealed internal hemorrhoids. The time to cecum=1 minutes 44 seconds.  Withdrawal time=9 minutes 17 seconds.  The scope was withdrawn and the procedure completed. COMPLICATIONS: There were no complications.  ENDOSCOPIC IMPRESSION: 1.   Normal mucosa in the terminal ileum 2.   Mild diverticulosis was noted in the sigmoid colon 3.   The colon mucosa was otherwise normal; no explanation for right-sided abdominal  pain  RECOMMENDATIONS: 1.  High fiber diet 2.  You should continue to follow colorectal cancer screening guidelines for "routine risk" patients with a repeat colonoscopy in 10 years.  There is no need for FOBT (stool) testing for at least 5 years. 3.  Return to primary care to discuss ongoing right-sided back for further evaluation and possible imaging of the lumbar spine as felt necessary   eSigned:  Beverley Fiedler, MD 07/10/2013 11:24 AM  cc: The Patient and Rudi Heap, MD

## 2013-07-10 NOTE — Progress Notes (Signed)
Stable to RR 

## 2013-07-10 NOTE — Patient Instructions (Signed)
YOU HAD AN ENDOSCOPIC PROCEDURE TODAY AT [facility]: Refer to the procedure report that was given to you for any specific questions about what was found during the examination.  If the procedure report does not answer your questions, please call your gastroenterologist to clarify.  If you requested that your care partner not be given the details of your procedure findings, then the procedure report has been included in a sealed envelope for you to review at your convenience later.  YOU SHOULD EXPECT: Some feelings of bloating in the abdomen. Passage of more gas than usual.  Walking can help get rid of the air that was put into your GI tract during the procedure and reduce the bloating. If you had a lower endoscopy (such as a colonoscopy or flexible sigmoidoscopy) you may notice spotting of blood in your stool or on the toilet paper. If you underwent a bowel prep for your procedure, then you may not have a normal bowel movement for a few days.  DIET: Your first meal following the procedure should be a light meal and then it is ok to progress to your normal diet.  A half-sandwich or bowl of soup is an example of a good first meal.  Heavy or fried foods are harder to digest and may make you feel nauseous or bloated.  Likewise meals heavy in dairy and vegetables can cause extra gas to form and this can also increase the bloating.  Drink plenty of fluids but you should avoid alcoholic beverages for 24 hours.  ACTIVITY: Your care partner should take you home directly after the procedure.  You should plan to take it easy, moving slowly for the rest of the day.  You can resume normal activity the day after the procedure however you should NOT DRIVE or use heavy machinery for 24 hours (because of the sedation medicines used during the test).    SYMPTOMS TO REPORT IMMEDIATELY: A gastroenterologist can be reached at any hour.  During normal business hours, 8:30 AM to 5:00 PM Monday through Friday,  call [phone].  After hours and on weekends, please call the GI answering service at [phone]  Emergency number who will take a message and have the physician on call contact you.   Following lower endoscopy (colonoscopy or flexible sigmoidoscopy):  Excessive amounts of blood in the stool  Significant tenderness or worsening of abdominal pains  Swelling of the abdomen that is new, acute  Fever of 100F or higher FOLLOW UP: If any biopsies were taken you will be contacted by phone or by letter within the next 1-3 weeks.  Call your gastroenterologist if you have not heard about the biopsies in 3 weeks.  Our staff will call the home number listed on your records the next business day following your procedure to check on you and address any questions or concerns that you may have at that time regarding the information given to you following your procedure. This is a courtesy call and so if there is no answer at the home number and we have not heard from you through the emergency physician on call, we will assume that you have returned to your regular daily activities without incident.  SIGNATURES/CONFIDENTIALITY: You and/or your care partner have signed paperwork which will be entered into your electronic medical record.  These signatures attest to the fact that that the information above on your After Visit Summary has been reviewed and is understood.  Full responsibility of the confidentiality  of this discharge information lies with you and/or your care-partner.  Handouts on diverticulosis and high fiber diet Repeat colon in 10 years 2024 Return to your pcp to discuss ongoing right sided back pain

## 2013-07-10 NOTE — Progress Notes (Signed)
Patient did not experience any of the following events: a burn prior to discharge; a fall within the facility; wrong site/side/patient/procedure/implant event; or a hospital transfer or hospital admission upon discharge from the facility. (G8907)Patient did not have preoperative order for IV antibiotic SSI prophylaxis. (G8918)Patient did not have preoperative order for IV antibiotic SSI prophylaxis. [phone]) ewm

## 2013-07-11 ENCOUNTER — Ambulatory Visit (INDEPENDENT_AMBULATORY_CARE_PROVIDER_SITE_OTHER): Payer: Self-pay | Admitting: General Practice

## 2013-07-11 ENCOUNTER — Telehealth: Payer: Self-pay | Admitting: *Deleted

## 2013-07-11 DIAGNOSIS — R109 Unspecified abdominal pain: Secondary | ICD-10-CM

## 2013-07-11 NOTE — Progress Notes (Signed)
Patient ID: Krista Finley, female   DOB: 24-Dec-1950, 62 y.o.   MRN: [id number]  Patient presents today to inform that colonoscopy was completed. She reports having a scheduled GYN appointment on Nov. 26, 2014.

## 2013-07-11 NOTE — Telephone Encounter (Signed)
  Follow up Call-  Call back number 07/10/2013  Post procedure Call Back phone  # [phone]  Permission to leave phone message Yes     Patient questions:  Do you have a fever, pain , or abdominal swelling? no Pain Score  0 *  Have you tolerated food without any problems? yes  Have you been able to return to your normal activities? yes  Do you have any questions about your discharge instructions: Diet   no Medications  no Follow up visit  no  Do you have questions or concerns about your Care? no  Actions: * If pain score is 4 or above: No action needed, pain <4.

## 2013-07-11 NOTE — Telephone Encounter (Signed)
Pt had COLON on 07/10/13.

## 2013-07-14 ENCOUNTER — Ambulatory Visit: Payer: BC Managed Care – PPO | Admitting: Internal Medicine

## 2013-09-20 ENCOUNTER — Ambulatory Visit (INDEPENDENT_AMBULATORY_CARE_PROVIDER_SITE_OTHER): Payer: BC Managed Care – PPO

## 2013-09-20 ENCOUNTER — Ambulatory Visit (INDEPENDENT_AMBULATORY_CARE_PROVIDER_SITE_OTHER): Payer: BC Managed Care – PPO | Admitting: General Practice

## 2013-09-20 ENCOUNTER — Encounter: Payer: Self-pay | Admitting: General Practice

## 2013-09-20 VITALS — BP 160/84 | HR 80 | Temp 97.1°F | Ht 64.0 in | Wt 149.0 lb

## 2013-09-20 DIAGNOSIS — R079 Chest pain, unspecified: Secondary | ICD-10-CM

## 2013-09-20 DIAGNOSIS — M940 Chondrocostal junction syndrome [Tietze]: Secondary | ICD-10-CM

## 2013-09-20 MED ORDER — PREDNISONE (PAK) 10 MG PO TABS: ORAL_TABLET | ORAL | Status: DC

## 2013-09-20 MED ORDER — METHYLPREDNISOLONE ACETATE 80 MG/ML IJ SUSP
80.0000 mg | Freq: Once | INTRAMUSCULAR | Status: AC
  Administered 2013-09-20: 80 mg via INTRAMUSCULAR

## 2013-09-20 MED ORDER — NAPROXEN 500 MG PO TABS: 500.0000 mg | ORAL_TABLET | Freq: Two times a day (BID) | ORAL | Status: DC

## 2013-09-20 MED ORDER — KETOROLAC TROMETHAMINE 60 MG/2ML IM SOLN
60.0000 mg | Freq: Once | INTRAMUSCULAR | Status: DC
Start: 2013-09-20 — End: 2013-09-20
  Administered 2013-09-20: 60 mg via INTRAMUSCULAR

## 2013-09-20 NOTE — Progress Notes (Signed)
   Subjective:    Patient ID: Krista Finley, female    DOB: 1951-03-19, 63 y.o.   MRN: [id number]  Chest Pain  This is a new problem. The current episode started yesterday. The onset quality is sudden. The problem occurs intermittently. The problem has been unchanged. The pain is present in the lateral region. The pain is at a severity of 7/10. The quality of the pain is described as sharp. The pain does not radiate. Pertinent negatives include no abdominal pain, back pain, cough, fever, irregular heartbeat, lower extremity edema, nausea, palpitations, vomiting or weakness. The pain is aggravated by deep breathing and movement. She has tried nothing for the symptoms. There are no known risk factors.      Review of Systems  Constitutional: Negative for fever and chills.  Respiratory: Negative for cough and chest tightness.   Cardiovascular: Positive for chest pain. Negative for palpitations.  Gastrointestinal: Negative for nausea, vomiting and abdominal pain.  Musculoskeletal: Negative for back pain.  Neurological: Negative for weakness.       Objective:   Physical Exam  Constitutional: She is oriented to person, place, and time. She appears well-developed and well-nourished.  Cardiovascular: Normal rate, regular rhythm and normal heart sounds.   Pulmonary/Chest: Effort normal and breath sounds normal. No respiratory distress. She exhibits tenderness.  Mid to right lateral chest tenderness with palpation  Abdominal: Soft. Bowel sounds are normal. She exhibits no distension. There is no tenderness.  Neurological: She is alert and oriented to person, place, and time.  Skin: Skin is warm and dry.  Psychiatric: She has a normal mood and affect.    [facility] reading (PRIMARY) by Erby Pian, FNP-C, no pneumothorax or rib fracture noted.       Assessment & Plan:  1. Chest pain  - EKG 12-Lead (normal sinus rhythm) - DG Chest 2 View; Future  2. Costochondritis  -  methylPREDNISolone acetate (DEPO-MEDROL) injection 80 mg; Inject 1 mL (80 mg total) into the muscle once. - ketorolac (TORADOL) injection 60 mg; Inject 2 mLs (60 mg total) into the muscle once. - naproxen (NAPROSYN) 500 MG tablet; Take 1 tablet (500 mg total) by mouth 2 (two) times daily with a meal.  Dispense: 30 tablet; Refill: 0 - predniSONE (STERAPRED UNI-PAK) 10 MG tablet; Take as directed  Dispense: 21 tablet; Refill: 0 -discussed and provided patient education on costochondritis -RTO if symptoms worsen or unresolved -Patient verbalized understanding Erby Pian, FNP-C

## 2013-09-20 NOTE — Patient Instructions (Signed)

## 2013-09-27 ENCOUNTER — Telehealth: Payer: Self-pay | Admitting: General Practice

## 2013-09-27 NOTE — Telephone Encounter (Signed)
Will give Krista Finley for review

## 2015-03-11 ENCOUNTER — Encounter: Payer: Self-pay | Admitting: Nurse Practitioner

## 2015-03-11 ENCOUNTER — Ambulatory Visit (INDEPENDENT_AMBULATORY_CARE_PROVIDER_SITE_OTHER): Payer: BLUE CROSS/BLUE SHIELD | Admitting: Nurse Practitioner

## 2015-03-11 VITALS — BP 161/107 | HR 99 | Temp 97.5°F | Ht 64.0 in | Wt 154.0 lb

## 2015-03-11 DIAGNOSIS — M545 Low back pain, unspecified: Secondary | ICD-10-CM

## 2015-03-11 MED ORDER — PREDNISONE 20 MG PO TABS: ORAL_TABLET | ORAL | Status: DC

## 2015-03-11 MED ORDER — CYCLOBENZAPRINE HCL 10 MG PO TABS: 10.0000 mg | ORAL_TABLET | Freq: Three times a day (TID) | ORAL | Status: DC | PRN

## 2015-03-11 MED ORDER — KETOROLAC TROMETHAMINE 60 MG/2ML IM SOLN
60.0000 mg | Freq: Once | INTRAMUSCULAR | Status: AC
  Administered 2015-03-11: 60 mg via INTRAMUSCULAR

## 2015-03-11 NOTE — Progress Notes (Signed)
   Subjective:    Patient ID: Krista Finley, female    DOB: 07-05-1951, 64 y.o.   MRN: [id number]  HPI Patient in today c/o back pain- said that she woke up Saturday morning and it was just hurting- rates 10/10- lower back- constant and radiates down to butt checks. Pain increases with movement. Nothing seems to help h epain- has tried [facility] powders extra strength tylenol- no help at all.    Review of Systems  Constitutional: Negative.   HENT: Negative.   Respiratory: Negative.   Cardiovascular: Negative.   Genitourinary: Negative.   Neurological: Negative.   Psychiatric/Behavioral: Negative.   All other systems reviewed and are negative.      Objective:   Physical Exam  Constitutional: She is oriented to person, place, and time. She appears well-developed and well-nourished. She appears distressed.  Cardiovascular: Normal rate, regular rhythm and normal heart sounds.   Pulmonary/Chest: Effort normal and breath sounds normal.  Musculoskeletal:  Rises very slowly from sitting position- trouble straightening all the way when standing (+) Slr bil at 45 degrees Motor strength and sensation distally intact.  Neurological: She is alert and oriented to person, place, and time. She has normal reflexes.  Skin: Skin is warm and dry.  Psychiatric: She has a normal mood and affect. Her behavior is normal. Judgment and thought content normal.   BP 161/107 mmHg  Pulse 99  Temp(Src) 97.5 F (36.4 C) (Oral)  Ht 5\' 4"  (1.626 m)  Wt 154 lb (69.854 kg)  BMI 26.42 kg/m2        Assessment & Plan:  1. Bilateral low back pain without sciatica Moist heat Rest  No heavy lifting Out of work till friday - ketorolac (TORADOL) injection 60 mg; Inject 2 mLs (60 mg total) into the muscle once. - predniSONE (DELTASONE) 20 MG tablet; 2 po at sametime daily for 5 days  Dispense: 10 tablet; Refill: 0 - cyclobenzaprine (FLEXERIL) 10 MG tablet; Take 1 tablet (10 mg total) by mouth 3 (three) times  daily as needed for muscle spasms.  Dispense: 30 tablet; Refill: [place], FNP

## 2015-03-14 ENCOUNTER — Telehealth: Payer: Self-pay | Admitting: Nurse Practitioner

## 2015-03-14 ENCOUNTER — Other Ambulatory Visit: Payer: Self-pay | Admitting: Nurse Practitioner

## 2015-03-14 DIAGNOSIS — M545 Low back pain, unspecified: Secondary | ICD-10-CM

## 2015-03-14 NOTE — Telephone Encounter (Signed)
Moist heat- will order MRI

## 2015-03-14 NOTE — Telephone Encounter (Signed)
MMM, do you have any other suggestions

## 2015-03-14 NOTE — Telephone Encounter (Signed)
LM - she will call on Friday

## 2015-04-15 ENCOUNTER — Ambulatory Visit (INDEPENDENT_AMBULATORY_CARE_PROVIDER_SITE_OTHER): Payer: BLUE CROSS/BLUE SHIELD | Admitting: Family

## 2015-04-15 ENCOUNTER — Encounter: Payer: Self-pay | Admitting: Family

## 2015-04-15 VITALS — BP 152/94 | HR 98 | Temp 97.6°F | Ht 64.0 in | Wt 156.0 lb

## 2015-04-15 DIAGNOSIS — J069 Acute upper respiratory infection, unspecified: Secondary | ICD-10-CM

## 2015-04-15 MED ORDER — HYDROCODONE-HOMATROPINE 5-1.5 MG/5ML PO SYRP: 5.0000 mL | ORAL_SOLUTION | Freq: Three times a day (TID) | ORAL | Status: DC | PRN

## 2015-04-15 MED ORDER — METHYLPREDNISOLONE 4 MG PO TBPK: ORAL_TABLET | ORAL | Status: DC

## 2015-04-15 MED ORDER — BENZONATATE 200 MG PO CAPS: 200.0000 mg | ORAL_CAPSULE | Freq: Three times a day (TID) | ORAL | Status: DC | PRN

## 2015-04-15 NOTE — Patient Instructions (Signed)
Upper Respiratory Infection, Adult An upper respiratory infection (URI) is also sometimes known as the common cold. The upper respiratory tract includes the nose, sinuses, throat, trachea, and bronchi. Bronchi are the airways leading to the lungs. Most people improve within 1 week, but symptoms can last up to 2 weeks. A residual cough may last even longer.  CAUSES Many different viruses can infect the tissues lining the upper respiratory tract. The tissues become irritated and inflamed and often become very moist. Mucus production is also common. A cold is contagious. You can easily spread the virus to others by oral contact. This includes kissing, sharing a glass, coughing, or sneezing. Touching your mouth or nose and then touching a surface, which is then touched by another person, can also spread the virus. SYMPTOMS  Symptoms typically develop 1 to 3 days after you come in contact with a cold virus. Symptoms vary from person to person. They may include:  Runny nose.  Sneezing.  Nasal congestion.  Sinus irritation.  Sore throat.  Loss of voice (laryngitis).  Cough.  Fatigue.  Muscle aches.  Loss of appetite.  Headache.  Low-grade fever. DIAGNOSIS  You might diagnose your own cold based on familiar symptoms, since most people get a cold 2 to 3 times a year. Your caregiver can confirm this based on your exam. Most importantly, your caregiver can check that your symptoms are not due to another disease such as strep throat, sinusitis, pneumonia, asthma, or epiglottitis. Blood tests, throat tests, and X-rays are not necessary to diagnose a common cold, but they may sometimes be helpful in excluding other more serious diseases. Your caregiver will decide if any further tests are required. RISKS AND COMPLICATIONS  You may be at risk for a more severe case of the common cold if you smoke cigarettes, have chronic heart disease (such as heart failure) or lung disease (such as asthma), or if  you have a weakened immune system. The very young and very old are also at risk for more serious infections. Bacterial sinusitis, middle ear infections, and bacterial pneumonia can complicate the common cold. The common cold can worsen asthma and chronic obstructive pulmonary disease (COPD). Sometimes, these complications can require emergency medical care and may be life-threatening. PREVENTION  The best way to protect against getting a cold is to practice good hygiene. Avoid oral or hand contact with people with cold symptoms. Wash your hands often if contact occurs. There is no clear evidence that vitamin C, vitamin E, echinacea, or exercise reduces the chance of developing a cold. However, it is always recommended to get plenty of rest and practice good nutrition. TREATMENT  Treatment is directed at relieving symptoms. There is no cure. Antibiotics are not effective, because the infection is caused by a virus, not by bacteria. Treatment may include:  Increased fluid intake. Sports drinks offer valuable electrolytes, sugars, and fluids.  Breathing heated mist or steam (vaporizer or shower).  Eating chicken soup or other clear broths, and maintaining good nutrition.  Getting plenty of rest.  Using gargles or lozenges for comfort.  Controlling fevers with ibuprofen or acetaminophen as directed by your caregiver.  Increasing usage of your inhaler if you have asthma. Zinc gel and zinc lozenges, taken in the first 24 hours of the common cold, can shorten the duration and lessen the severity of symptoms. Pain medicines may help with fever, muscle aches, and throat pain. A variety of non-prescription medicines are available to treat congestion and runny nose. Your caregiver   can make recommendations and may suggest nasal or lung inhalers for other symptoms.  HOME CARE INSTRUCTIONS   Only take over-the-counter or prescription medicines for pain, discomfort, or fever as directed by your  caregiver.  Use a warm mist humidifier or inhale steam from a shower to increase air moisture. This may keep secretions moist and make it easier to breathe.  Drink enough water and fluids to keep your urine clear or pale yellow.  Rest as needed.  Return to work when your temperature has returned to normal or as your caregiver advises. You may need to stay home longer to avoid infecting others. You can also use a face mask and careful hand washing to prevent spread of the virus. SEEK MEDICAL CARE IF:   After the first few days, you feel you are getting worse rather than better.  You need your caregiver's advice about medicines to control symptoms.  You develop chills, worsening shortness of breath, or brown or red sputum. These may be signs of pneumonia.  You develop yellow or brown nasal discharge or pain in the face, especially when you bend forward. These may be signs of sinusitis.  You develop a fever, swollen neck glands, pain with swallowing, or white areas in the back of your throat. These may be signs of strep throat. SEEK IMMEDIATE MEDICAL CARE IF:   You have a fever.  You develop severe or persistent headache, ear pain, sinus pain, or chest pain.  You develop wheezing, a prolonged cough, cough up blood, or have a change in your usual mucus (if you have chronic lung disease).  You develop sore muscles or a stiff neck. Document Released: 02/03/2001 Document Revised: 11/02/2011 Document Reviewed: 11/15/2013 Exit[employer] Patient Information 2015 [employer]. This information is not intended to replace advice given to you by your health care provider. Make sure you discuss any questions you have with your health care provider.  - Take meds as prescribed - Use a cool mist humidifier  -Use saline nose sprays frequently -Saline irrigations of the nose can be very helpful if done frequently.  * 4X daily for 1 week*  * Use of a nettie pot can be helpful with this. Follow  directions with this* -Force fluids -For any cough or congestion  Use plain Mucinex- regular strength or max strength is fine   * Children- consult with Pharmacist for dosing -For fever or aces or pains- take tylenol or ibuprofen appropriate for age and weight.  * for fevers greater than 101 orally you may alternate ibuprofen and tylenol every  3 hours. -Throat lozenges if help -New toothbrush in 3 days   Estel Tonelli, FNP  

## 2015-04-15 NOTE — Progress Notes (Signed)
Subjective:    Patient ID: Krista Finley, female    DOB: [birth year], 64 y.o.   MRN: [id number]  Cough This is a new problem. The current episode started in the past 7 days (Friday). The problem has been waxing and waning. The problem occurs constantly. The cough is non-productive. Associated symptoms include chills, ear congestion, headaches, myalgias, nasal congestion, postnasal drip, rhinorrhea and a sore throat. Pertinent negatives include no ear pain, fever, shortness of breath or wheezing. The symptoms are aggravated by lying down. She has tried rest for the symptoms. The treatment provided moderate relief. There is no history of asthma or COPD.      Review of Systems  Constitutional: Positive for chills. Negative for fever.  HENT: Positive for postnasal drip, rhinorrhea and sore throat. Negative for ear pain.   Eyes: Negative.   Respiratory: Positive for cough. Negative for shortness of breath and wheezing.   Cardiovascular: Negative.  Negative for palpitations.  Gastrointestinal: Negative.   Endocrine: Negative.   Genitourinary: Negative.   Musculoskeletal: Positive for myalgias.  Neurological: Positive for headaches.  Hematological: Negative.   Psychiatric/Behavioral: Negative.   All other systems reviewed and are negative.      Objective:   Physical Exam  Constitutional: She is oriented to person, place, and time. She appears well-developed and well-nourished. No distress.  HENT:  Head: Normocephalic and atraumatic.  Right Ear: External ear normal.  Left Ear: External ear normal.  Nasal passage erythemas with mild swelling  Oropharynx erythemas   Eyes: Pupils are equal, round, and reactive to light.  Neck: Normal range of motion. Neck supple. No thyromegaly present.  Cardiovascular: Normal rate, regular rhythm, normal heart sounds and intact distal pulses.   No murmur heard. Pulmonary/Chest: Effort normal and breath sounds normal. No respiratory distress. She  has no wheezes.  Abdominal: Soft. Bowel sounds are normal. She exhibits no distension. There is no tenderness.  Musculoskeletal: Normal range of motion. She exhibits no edema or tenderness.  Neurological: She is alert and oriented to person, place, and time. She has normal reflexes.  Skin: Skin is warm and dry.  Psychiatric: She has a normal mood and affect. Her behavior is normal. Judgment and thought content normal.  Vitals reviewed.   BP 152/94 mmHg  Pulse 98  Temp(Src) 97.6 F (36.4 C) (Oral)  Ht 5\' 4"  (1.626 m)  Wt 156 lb (70.761 kg)  BMI 26.76 kg/m2       Assessment & Plan:  1. Acute upper respiratory infection -- Take meds as prescribed - Use a cool mist humidifier  -Use saline nose sprays frequently -Saline irrigations of the nose can be very helpful if done frequently.  * 4X daily for 1 week*  * Use of a nettie pot can be helpful with this. Follow directions with this* -Force fluids -For any cough or congestion  Use plain Mucinex- regular strength or max strength is fine   * Children- consult with Pharmacist for dosing -For fever or aces or pains- take tylenol or ibuprofen appropriate for age and weight.  * for fevers greater than 101 orally you may alternate ibuprofen and tylenol every  3 hours. -Throat lozenges if help - methylPREDNISolone (MEDROL DOSEPAK) 4 MG TBPK tablet; Use as directed  Dispense: 21 tablet; Refill: 0 - benzonatate (TESSALON) 200 MG capsule; Take 1 capsule (200 mg total) by mouth 3 (three) times daily as needed.  Dispense: 30 capsule; Refill: 1 - HYDROcodone-homatropine (HYCODAN) 5-1.5 MG/5ML syrup; Take 5 mLs by  mouth every 8 (eight) hours as needed for cough.  Dispense: 120 mL; Refill: 0  Evelina Dun, FNP

## 2015-04-16 ENCOUNTER — Encounter: Payer: Self-pay | Admitting: *Deleted

## 2015-04-16 ENCOUNTER — Ambulatory Visit (INDEPENDENT_AMBULATORY_CARE_PROVIDER_SITE_OTHER): Payer: BLUE CROSS/BLUE SHIELD | Admitting: Family Medicine

## 2015-04-16 ENCOUNTER — Ambulatory Visit (INDEPENDENT_AMBULATORY_CARE_PROVIDER_SITE_OTHER): Payer: BLUE CROSS/BLUE SHIELD

## 2015-04-16 ENCOUNTER — Telehealth: Payer: Self-pay | Admitting: Nurse Practitioner

## 2015-04-16 VITALS — BP 153/102 | HR 112 | Temp 97.1°F | Ht 64.0 in | Wt 156.0 lb

## 2015-04-16 DIAGNOSIS — M5441 Lumbago with sciatica, right side: Secondary | ICD-10-CM | POA: Diagnosis not present

## 2015-04-16 DIAGNOSIS — M545 Low back pain: Secondary | ICD-10-CM | POA: Diagnosis not present

## 2015-04-16 MED ORDER — CYCLOBENZAPRINE HCL 10 MG PO TABS: 10.0000 mg | ORAL_TABLET | Freq: Three times a day (TID) | ORAL | Status: DC | PRN

## 2015-04-16 MED ORDER — TRAMADOL HCL 50 MG PO TABS: 50.0000 mg | ORAL_TABLET | Freq: Three times a day (TID) | ORAL | Status: DC | PRN

## 2015-04-16 NOTE — Telephone Encounter (Signed)
[facility] have not seen since mi July for this

## 2015-04-16 NOTE — Patient Instructions (Addendum)
Take muscle relaxer 2-3 times daily as needed Finish prednisone Dosepak Take pain pill if needed for pain , one every 8 hours if needed. Remain out of work We will make an attempt again to get an MRI of your LS spine because of the severe nature of the pain that you're having.

## 2015-04-16 NOTE — Telephone Encounter (Signed)
Patient states that her back pain has not gotten better and it is now shooting down her leg. Please advise

## 2015-04-16 NOTE — Progress Notes (Signed)
Subjective:    Patient ID: Krista Finley, female    DOB: 02-Oct-1950, 64 y.o.   MRN: [id number]  HPI Patient here today for lower right side back pain that started last night. The patient has had pain in her back periodically in the past but this is the worst that she has ever had. This started suddenly last night. She's been coughing and congested a lot recently and is currently on a prednisone Dosepak. She was unable to sleep all night last night. She cannot find a position to be comfortable in. She says if she is sitting and she raises the leg she has severe pain from her back down the right lateral aspect of her thigh. She also has pain down the left thigh lateral aspect right is much greater than left.        Patient Active Problem List   Diagnosis Date Noted  . HTN (hypertension) 06/20/2013   Outpatient Encounter Prescriptions as of 04/16/2015  Medication Sig  . benzonatate (TESSALON) 200 MG capsule Take 1 capsule (200 mg total) by mouth 3 (three) times daily as needed.  . cyclobenzaprine (FLEXERIL) 10 MG tablet Take 1 tablet (10 mg total) by mouth 3 (three) times daily as needed for muscle spasms.  Marland Kitchen HYDROcodone-homatropine (HYCODAN) 5-1.5 MG/5ML syrup Take 5 mLs by mouth every 8 (eight) hours as needed for cough.  . methylPREDNISolone (MEDROL DOSEPAK) 4 MG TBPK tablet Use as directed  . olmesartan-hydrochlorothiazide (BENICAR HCT) 40-25 MG per tablet Take 1 tablet by mouth daily.  . [DISCONTINUED] predniSONE (DELTASONE) 20 MG tablet 2 po at sametime daily for 5 days   No facility-administered encounter medications on file as of 04/16/2015.     Review of Systems  Constitutional: Negative.   HENT: Negative.   Eyes: Negative.   Respiratory: Negative.   Cardiovascular: Negative.   Gastrointestinal: Negative.   Endocrine: Negative.   Genitourinary: Negative.   Musculoskeletal: Positive for back pain (right side lower).  Skin: Negative.   Allergic/Immunologic: Negative.     Neurological: Negative.   Hematological: Negative.   Psychiatric/Behavioral: Negative.        Objective:   Physical Exam  Constitutional: She is oriented to person, place, and time. She appears well-nourished. She appears distressed.  HENT:  Head: Normocephalic and atraumatic.  Eyes: Conjunctivae and EOM are normal. Pupils are equal, round, and reactive to light. Right eye exhibits no discharge. No scleral icterus.  Neck: Normal range of motion.  Musculoskeletal: She exhibits tenderness. She exhibits no edema.  There is limited range of motion of both lower extremities right much worse than left on straight leg raising only to about 20 on the right. On the left about 60. With sitting unable to raise hip. Without severe pain.  Neurological: She is alert and oriented to person, place, and time.  Reflexes are 2+ on the right 1+ on the left  Skin: Skin is warm and dry. No rash noted. No erythema.  Psychiatric: She has a normal mood and affect. Her behavior is normal. Judgment and thought content normal.  Nursing note and vitals reviewed.   BP 153/102 mmHg  Pulse 112  Temp(Src) 97.1 F (36.2 C) (Oral)  Ht 5\' 4"  (1.626 m)  Wt 156 lb (70.761 kg)  BMI 26.76 kg/m2 the patient's blood pressures have been good at home at least yesterday. And therefore no change in blood pressure treatment.  [facility] reading (PRIMARY) by  Dr. Lamar Blinks degenerative changes L5-S1  Assessment & Plan:  1. Right low back pain, with sciatica presence unspecified -Patient will continue with the prednisone that she is currently taking for her respiratory tract. -She will take a muscle relaxer 3 times daily if needed for muscle relaxation and take the pain pill if needed for severe pain - DG Lumbar Spine 2-3 Views; Future - traMADol (ULTRAM) 50 MG tablet; Take 1 tablet (50 mg total) by mouth every 8 (eight) hours as needed.  Dispense: 21 tablet; Refill: 0  2.  Right-sided low back pain with right-sided sciatica -Use warm wet compresses to low back area -We will do our best to get an MRI arrange for this patient. - cyclobenzaprine (FLEXERIL) 10 MG tablet; Take 1 tablet (10 mg total) by mouth 3 (three) times daily as needed for muscle spasms.  Dispense: 30 tablet; Refill: 1  Patient Instructions  Take muscle relaxer 2-3 times daily as needed Finish prednisone Dosepak Take pain pill if needed for pain , one every 8 hours if needed. Remain out of work We will make an attempt again to get an MRI of your LS spine because of the severe nature of the pain that you're having.   Arrie Senate MD

## 2015-04-16 NOTE — Telephone Encounter (Signed)
Patient given an appointment for today with Laurance Flatten.

## 2015-04-22 ENCOUNTER — Encounter: Payer: Self-pay | Admitting: Nurse Practitioner

## 2015-04-22 ENCOUNTER — Ambulatory Visit (INDEPENDENT_AMBULATORY_CARE_PROVIDER_SITE_OTHER): Payer: BLUE CROSS/BLUE SHIELD | Admitting: Nurse Practitioner

## 2015-04-22 VITALS — BP 148/89 | HR 118 | Temp 97.9°F | Ht 64.0 in | Wt 160.0 lb

## 2015-04-22 DIAGNOSIS — M545 Low back pain, unspecified: Secondary | ICD-10-CM

## 2015-04-22 DIAGNOSIS — I10 Essential (primary) hypertension: Secondary | ICD-10-CM

## 2015-04-22 MED ORDER — METHYLPREDNISOLONE ACETATE 80 MG/ML IJ SUSP
80.0000 mg | Freq: Once | INTRAMUSCULAR | Status: AC
  Administered 2015-04-22: 80 mg via INTRAMUSCULAR

## 2015-04-22 NOTE — Progress Notes (Signed)
   Subjective:    Patient ID: Krista Finley, female    DOB: [birth year], 64 y.o.   MRN: [id number]   Patient in today for follow up of hypertension and c/o back pain.  Back Pain This is a recurrent problem. The current episode started more than 1 year ago. The problem occurs intermittently. The problem has been gradually worsening since onset. The pain is at a severity of 7/10. The pain is moderate. The treatment provided mild (saw Dr. Laurance Flatten last week with same complaint and told her to continue muscle relaxors) relief.  Hypertension This is a chronic problem. The current episode started more than 1 year ago. The problem is unchanged. The problem is controlled. Risk factors for coronary artery disease include post-menopausal state. Past treatments include angiotensin blockers and diuretics. The current treatment provides moderate improvement. Compliance problems include diet and exercise.  There is no history of CAD/MI or CVA.      Review of Systems  Constitutional: Negative.   HENT: Negative.   Respiratory: Negative.   Cardiovascular: Negative.   Gastrointestinal: Negative.   Genitourinary: Negative.   Musculoskeletal: Positive for back pain.  Neurological: Negative.   Psychiatric/Behavioral: Negative.   All other systems reviewed and are negative.      Objective:   Physical Exam  Constitutional: She is oriented to person, place, and time. She appears well-developed and well-nourished.  Cardiovascular: Normal rate, regular rhythm and normal heart sounds.   Pulmonary/Chest: Effort normal and breath sounds normal.  Abdominal: Soft. Bowel sounds are normal.  Musculoskeletal:  Low back pain on palpation Decrease ROM with movement in any direction Motor strength and sensation distally intact ( + ) SLR at 45 degrees bil  Neurological: She is alert and oriented to person, place, and time.  Skin: Skin is warm.  Psychiatric: She has a normal mood and affect. Her behavior is  normal. Judgment and thought content normal.   BP 148/89 mmHg  Pulse 118  Temp(Src) 97.9 F (36.6 C) (Oral)  Ht $R'5\' 4"'Zh$  (1.626 m)  Wt 160 lb (72.576 kg)  BMI 27.45 kg/m2         Assessment & Plan:  1. Essential hypertension Do not add salt to diet - CMP14+EGFR - Lipid panel  2. Midline low back pain without sciatica Moist heat Rest RTO prn - methylPREDNISolone acetate (DEPO-MEDROL) injection 80 mg; Inject 1 mL (80 mg total) into the muscle once. - Ambulatory referral to Orthopedic Surgery    Labs pending Health maintenance reviewed Diet and exercise encouraged Continue all meds Follow up  In 6 months   [place], FNP

## 2015-04-22 NOTE — Patient Instructions (Signed)
Back Pain, Adult °Back pain is very common. The pain often gets better over time. The cause of back pain is usually not dangerous. Most people can learn to manage their back pain on their own.  °HOME CARE  °· Stay active. Start with short walks on flat ground if you can. Try to walk farther each day. °· Do not sit, drive, or stand in one place for more than 30 minutes. Do not stay in bed. °· Do not avoid exercise or work. Activity can help your back heal faster. °· Be careful when you bend or lift an object. Bend at your knees, keep the object close to you, and do not twist. °· Sleep on a firm mattress. Lie on your side, and bend your knees. If you lie on your back, put a pillow under your knees. °· Only take medicines as told by your doctor. °· Put ice on the injured area. °¨ Put ice in a plastic bag. °¨ Place a towel between your skin and the bag. °¨ Leave the ice on for 15-20 minutes, 03-04 times a day for the first 2 to 3 days. After that, you can switch between ice and heat packs. °· Ask your doctor about back exercises or massage. °· Avoid feeling anxious or stressed. Find good ways to deal with stress, such as exercise. °GET HELP RIGHT AWAY IF:  °· Your pain does not go away with rest or medicine. °· Your pain does not go away in 1 week. °· You have new problems. °· You do not feel well. °· The pain spreads into your legs. °· You cannot control when you poop (bowel movement) or pee (urinate). °· Your arms or legs feel weak or lose feeling (numbness). °· You feel sick to your stomach (nauseous) or throw up (vomit). °· You have belly (abdominal) pain. °· You feel like you may pass out (faint). °MAKE SURE YOU:  °· Understand these instructions. °· Will watch your condition. °· Will get help right away if you are not doing well or get worse. °Document Released: 01/27/2008 Document Revised: 11/02/2011 Document Reviewed: 12/12/2013 °[employer]® Patient Information ©2015 [employer]. This information is not intended  to replace advice given to you by your health care provider. Make sure you discuss any questions you have with your health care provider. ° °

## 2015-04-25 ENCOUNTER — Telehealth: Payer: Self-pay | Admitting: Nurse Practitioner

## 2015-04-25 ENCOUNTER — Inpatient Hospital Stay (HOSPITAL_COMMUNITY): Payer: BLUE CROSS/BLUE SHIELD

## 2015-04-25 ENCOUNTER — Emergency Department (HOSPITAL_COMMUNITY): Payer: BLUE CROSS/BLUE SHIELD

## 2015-04-25 ENCOUNTER — Encounter (HOSPITAL_COMMUNITY): Payer: Self-pay | Admitting: *Deleted

## 2015-04-25 ENCOUNTER — Inpatient Hospital Stay (HOSPITAL_COMMUNITY)
Admission: EM | Admit: 2015-04-25 | Discharge: 2015-04-27 | DRG: 065 | Disposition: A | Discharge status: Home / Self Care | Payer: BLUE CROSS/BLUE SHIELD | Attending: Internal Medicine | Admitting: Internal Medicine

## 2015-04-25 DIAGNOSIS — I10 Essential (primary) hypertension: Secondary | ICD-10-CM | POA: Diagnosis present

## 2015-04-25 DIAGNOSIS — R29810 Facial weakness: Secondary | ICD-10-CM | POA: Diagnosis present

## 2015-04-25 DIAGNOSIS — G8191 Hemiplegia, unspecified affecting right dominant side: Secondary | ICD-10-CM | POA: Diagnosis present

## 2015-04-25 DIAGNOSIS — I6789 Other cerebrovascular disease: Secondary | ICD-10-CM

## 2015-04-25 DIAGNOSIS — Z79891 Long term (current) use of opiate analgesic: Secondary | ICD-10-CM | POA: Diagnosis not present

## 2015-04-25 DIAGNOSIS — Z79899 Other long term (current) drug therapy: Secondary | ICD-10-CM

## 2015-04-25 DIAGNOSIS — M5441 Lumbago with sciatica, right side: Secondary | ICD-10-CM | POA: Diagnosis present

## 2015-04-25 DIAGNOSIS — Z8673 Personal history of transient ischemic attack (TIA), and cerebral infarction without residual deficits: Secondary | ICD-10-CM | POA: Diagnosis present

## 2015-04-25 DIAGNOSIS — I6330 Cerebral infarction due to thrombosis of unspecified cerebral artery: Secondary | ICD-10-CM

## 2015-04-25 DIAGNOSIS — I639 Cerebral infarction, unspecified: Secondary | ICD-10-CM | POA: Diagnosis not present

## 2015-04-25 DIAGNOSIS — R4781 Slurred speech: Secondary | ICD-10-CM | POA: Diagnosis present

## 2015-04-25 DIAGNOSIS — I63312 Cerebral infarction due to thrombosis of left middle cerebral artery: Secondary | ICD-10-CM | POA: Diagnosis not present

## 2015-04-25 DIAGNOSIS — E785 Hyperlipidemia, unspecified: Secondary | ICD-10-CM | POA: Diagnosis present

## 2015-04-25 LAB — URINE MICROSCOPIC-ADD ON

## 2015-04-25 LAB — COMPREHENSIVE METABOLIC PANEL
ALBUMIN: 3.7 g/dL (ref 3.5–5.0)
ALT: 11 U/L — AB (ref 14–54)
AST: 22 U/L (ref 15–41)
Alkaline Phosphatase: 98 U/L (ref 38–126)
Anion gap: 8 (ref 5–15)
BUN: 9 mg/dL (ref 6–20)
CHLORIDE: 99 mmol/L — AB (ref 101–111)
CO2: 29 mmol/L (ref 22–32)
CREATININE: 0.83 mg/dL (ref 0.44–1.00)
Calcium: 9.3 mg/dL (ref 8.9–10.3)
GFR calc Af Amer: >60 mL/min (ref >60)
GLUCOSE: 101 mg/dL — AB (ref 65–99)
POTASSIUM: 4.2 mmol/L (ref 3.5–5.1)
SODIUM: 136 mmol/L (ref 135–145)
Total Bilirubin: 0.4 mg/dL (ref 0.3–1.2)
Total Protein: 6.8 g/dL (ref 6.5–8.1)

## 2015-04-25 LAB — CBG MONITORING, ED: Glucose-Capillary: 90 mg/dL (ref 65–99)

## 2015-04-25 LAB — CBC
HEMATOCRIT: 41.5 % (ref 36.0–46.0)
Hemoglobin: 13.4 g/dL (ref 12.0–15.0)
MCH: 29.6 pg (ref 26.0–34.0)
MCHC: 32.3 g/dL (ref 30.0–36.0)
MCV: 91.6 fL (ref 78.0–100.0)
Platelets: 266 10*3/uL (ref 150–400)
RBC: 4.53 MIL/uL (ref 3.87–5.11)
RDW: 13.4 % (ref 11.5–15.5)
WBC: 6.3 10*3/uL (ref 4.0–10.5)

## 2015-04-25 LAB — RAPID URINE DRUG SCREEN, HOSP PERFORMED
AMPHETAMINES: NOT DETECTED
BARBITURATES: NOT DETECTED
BENZODIAZEPINES: NOT DETECTED
COCAINE: NOT DETECTED
Opiates: NOT DETECTED
TETRAHYDROCANNABINOL: NOT DETECTED

## 2015-04-25 LAB — DIFFERENTIAL
BASOS ABS: 0 10*3/uL (ref 0.0–0.1)
BASOS PCT: 0 % (ref 0–1)
Eosinophils Absolute: 0 10*3/uL (ref 0.0–0.7)
Eosinophils Relative: 1 % (ref 0–5)
Lymphocytes Relative: 30 % (ref 12–46)
Lymphs Abs: 1.9 10*3/uL (ref 0.7–4.0)
MONOS PCT: 8 % (ref 3–12)
Monocytes Absolute: 0.5 10*3/uL (ref 0.1–1.0)
NEUTROS ABS: 3.8 10*3/uL (ref 1.7–7.7)
NEUTROS PCT: 61 % (ref 43–77)

## 2015-04-25 LAB — URINALYSIS, ROUTINE W REFLEX MICROSCOPIC
Bilirubin Urine: NEGATIVE
GLUCOSE, UA: NEGATIVE mg/dL
KETONES UR: NEGATIVE mg/dL
Nitrite: NEGATIVE
PH: 7.5 (ref 5.0–8.0)
PROTEIN: NEGATIVE mg/dL
Specific Gravity, Urine: 1.026 (ref 1.005–1.030)
Urobilinogen, UA: 0.2 mg/dL (ref 0.0–1.0)

## 2015-04-25 LAB — I-STAT CHEM 8, ED
BUN: 11 mg/dL (ref 6–20)
CHLORIDE: 100 mmol/L — AB (ref 101–111)
CREATININE: 0.9 mg/dL (ref 0.44–1.00)
Calcium, Ion: 1.13 mmol/L (ref 1.13–1.30)
GLUCOSE: 102 mg/dL — AB (ref 65–99)
HEMATOCRIT: 43 % (ref 36.0–46.0)
Hemoglobin: 14.6 g/dL (ref 12.0–15.0)
POTASSIUM: 4.2 mmol/L (ref 3.5–5.1)
Sodium: 137 mmol/L (ref 135–145)
TCO2: 28 mmol/L (ref 0–100)

## 2015-04-25 LAB — I-STAT TROPONIN, ED: Troponin i, poc: 0 ng/mL (ref 0.00–0.08)

## 2015-04-25 LAB — ETHANOL

## 2015-04-25 LAB — PROTIME-INR
INR: 0.96 (ref 0.00–1.49)
Prothrombin Time: 13 seconds (ref 11.6–15.2)

## 2015-04-25 LAB — APTT: APTT: 35 s (ref 24–37)

## 2015-04-25 MED ORDER — IOHEXOL 350 MG/ML SOLN
100.0000 mL | Freq: Once | INTRAVENOUS | Status: AC | PRN
  Administered 2015-04-25: 100 mL via INTRAVENOUS

## 2015-04-25 MED ORDER — STROKE: EARLY STAGES OF RECOVERY BOOK
Freq: Once | Status: AC
  Administered 2015-04-25: 14:00:00

## 2015-04-25 MED ORDER — LABETALOL HCL 5 MG/ML IV SOLN: 10.0000 mg | INTRAVENOUS | Status: DC | PRN

## 2015-04-25 MED ORDER — ASPIRIN 325 MG PO TABS
325.0000 mg | ORAL_TABLET | Freq: Every day | ORAL | Status: DC
  Administered 2015-04-25 – 2015-04-27 (×3): 325 mg via ORAL
  Filled 2015-04-25 (×4): qty 1

## 2015-04-25 MED ORDER — ENOXAPARIN SODIUM 40 MG/0.4ML ~~LOC~~ SOLN
40.0000 mg | SUBCUTANEOUS | Status: DC
  Administered 2015-04-25 – 2015-04-26 (×2): 40 mg via SUBCUTANEOUS
  Filled 2015-04-25 (×2): qty 0.4

## 2015-04-25 MED ORDER — ASPIRIN 300 MG RE SUPP: 300.0000 mg | Freq: Every day | RECTAL | Status: DC

## 2015-04-25 MED ORDER — SENNOSIDES-DOCUSATE SODIUM 8.6-50 MG PO TABS
1.0000 | ORAL_TABLET | Freq: Every evening | ORAL | Status: DC | PRN
Start: 2015-04-25 — End: 2015-04-27

## 2015-04-25 NOTE — ED Provider Notes (Signed)
CSN: [id number]     Arrival date & time 04/25/15  1023 History   First MD Initiated Contact with Patient 04/25/15 1024     Chief Complaint  Patient presents with  . Code Stroke   HPI Patient presents to the emergency room for evaluation of waxing and waning neurologic symptoms.   The patient states over the last week she's had some intermittent episodes of right facial drooping, weakness and numbness on her right arm and right leg. When she woke up this morning she was unable to stand up. The last time she was noted to be normal was at 11 PM last night before she went to bed. Patient states right now that her symptoms are actually getting a little bit better again. Eyes any trouble with her speech or vision. No headache or recent injuries.  A code stroke was initiated prior to her arrival. Past Medical History  Diagnosis Date  . Hypertension   . Gastritis   . Hyperlipidemia   . Kidney stones    Past Surgical History  Procedure Laterality Date  . Elbow surgery Right    Family History  Problem Relation Age of Onset  . Hypertension Mother   . Heart disease Mother   . Heart disease Father     MI  . Stomach cancer Maternal Aunt   . Colon cancer Neg Hx    Social History  Substance Use Topics  . Smoking status: Never Smoker   . Smokeless tobacco: Never Used  . Alcohol Use: No   OB History    No data available     Review of Systems  All other systems reviewed and are negative.     Allergies  Review of patient's allergies indicates no known allergies.  Home Medications   Prior to Admission medications   Medication Sig Start Date End Date Taking? Authorizing Provider  benzonatate (TESSALON) 200 MG capsule Take 1 capsule (200 mg total) by mouth 3 (three) times daily as needed. 04/15/15   Sharion Balloon, FNP  cyclobenzaprine (FLEXERIL) 10 MG tablet Take 1 tablet (10 mg total) by mouth 3 (three) times daily as needed for muscle spasms. 04/16/15   Chipper Herb, MD   olmesartan-hydrochlorothiazide (BENICAR HCT) 40-25 MG per tablet Take 1 tablet by mouth daily.    Historical Provider, MD  traMADol (ULTRAM) 50 MG tablet Take 1 tablet (50 mg total) by mouth every 8 (eight) hours as needed. 04/16/15   Chipper Herb, MD   BP 185/105 mmHg  Pulse 104  Temp(Src) 97.7 F (36.5 C) (Oral)  Resp 16  Ht 5\' 4"  (1.626 m)  Wt 161 lb 2 oz (73.086 kg)  BMI 27.64 kg/m2  SpO2 98% Physical Exam  Constitutional: She is oriented to person, place, and time. She appears well-developed and well-nourished. No distress.  HENT:  Head: Normocephalic and atraumatic.  Right Ear: External ear normal.  Left Ear: External ear normal.  Eyes: Conjunctivae are normal. Right eye exhibits no discharge. Left eye exhibits no discharge. No scleral icterus.  Neck: Neck supple. No tracheal deviation present.  Cardiovascular: Normal rate, regular rhythm and normal heart sounds.   Pulmonary/Chest: Effort normal. No stridor. No respiratory distress. She has wheezes.  Abdominal: Soft. She exhibits no distension.  Musculoskeletal: She exhibits no edema or tenderness.  Neurological: She is alert and oriented to person, place, and time. Cranial nerve deficit: no gross deficits.  Pt is able to move her right arm and leg on my exam, no  deficits on the left side,  No facial droop, please see assessment by stroke team for full neuro exam  Skin: Skin is warm and dry. No rash noted.  Psychiatric: She has a normal mood and affect.  Nursing note and vitals reviewed.   ED Course  Procedures (including critical care time) Labs Review Labs Reviewed  COMPREHENSIVE METABOLIC PANEL - Abnormal; Notable for the following:    Chloride 99 (*)    Glucose, Bld 101 (*)    ALT 11 (*)    All other components within normal limits  I-STAT CHEM 8, ED - Abnormal; Notable for the following:    Chloride 100 (*)    Glucose, Bld 102 (*)    All other components within normal limits  PROTIME-INR  APTT  CBC   DIFFERENTIAL  ETHANOL  URINE RAPID DRUG SCREEN, HOSP PERFORMED  URINALYSIS, ROUTINE W REFLEX MICROSCOPIC (NOT AT [facility])  Randolm Idol, ED    Imaging Review Ct Head Wo Contrast  04/25/2015   CLINICAL DATA:  64 year old female who awoke with stroke symptoms, right-sided neurologic deficits. Code stroke. Initial encounter.  EXAM: CT HEAD WITHOUT CONTRAST  TECHNIQUE: Contiguous axial images were obtained from the base of the skull through the vertex without intravenous contrast.  COMPARISON:  Head CT without contrast 12/21/2003.  FINDINGS: No acute osseous abnormality identified. Visualized paranasal sinuses and mastoids are clear. Orbit and scalp soft tissues appear normal.  Calcified atherosclerosis at the skull base. Mild generalized cerebral volume loss since 2005. No ventriculomegaly. No midline shift, mass effect, or evidence of intracranial mass lesion. No acute intracranial hemorrhage identified. No acute cortically based infarct identified. There is subcentimeter hypodensity in the left lentiform nuclei which is new but age indeterminate (series 2, image 55). No suspicious intracranial vascular hyperdensity.  IMPRESSION: No acute cortically based infarct or acute intracranial hemorrhage identified.  Mild age indeterminate left basal ganglia hypodensity suggestive of small vessel disease.   Electronically Signed   By: Genevie Ann M.D.   On: 04/25/2015 10:57   I have personally reviewed and evaluated these image reports and lab results as part of my medical decision-making.   EKG Interpretation   Date/Time:  Thursday April 25 2015 10:57:37 EDT Ventricular Rate:  95 PR Interval:  160 QRS Duration: 89 QT Interval:  374 QTC Calculation: 470 R Axis:   75 Text Interpretation:  Sinus rhythm Consider right atrial enlargement Since  last tracing rate faster Confirmed by Tarae Wooden  MD-J, Jovin Fester ([id number]) on 04/25/2015  11:17:15 AM      MDM   Final diagnoses:  CVA (cerebral infarction)  CVA  (cerebral infarction)    Pt's sx concerning for stroke.  Not a TPA candidate.  Sx are improving and patient is outside of the TPA window.  Will admit for further treatment and evaluation.    Dorie Rank, MD 04/25/15 [phone]

## 2015-04-25 NOTE — ED Notes (Signed)
Pt arrives from home via [facility]. Pt has c/o right sided facial droop with drooling, slurred speech and right sided weakness. Pt states she woke up this morning and was unable to stand up. Pt lsn was before bed at 1100 last night. Pt states she has been having intermittent episodes of inability to move her right side x3 days.

## 2015-04-25 NOTE — H&P (Signed)
[facility] History and Physical  ELYNA PANGILINAN EHU:[id number] DOB: 08/05/1951 DOA: 04/25/2015  Referring physician:  PCP: Sharion Balloon, FNP   Chief Complaint: Right-sided weakness  HPI: JESSIKA ROTHERY is a 64 y.o. female with a past medical history of hypertension presented to the emergency department with complaints of right-sided weakness. She reports having transient episode of right-sided weakness last Tuesday then had 3 transient episodes yesterday. She was last seen normal by her mother at 81 PM yesterday evening prior to her going sleep. This morning she woke up with significant right-sided weakness, right-sided facial droop and slurred speech. Deficits were severe enough to him. Her ability to ambulate. Her mother called EMS. She was brought to the emergency department as a code CVA. A CTA of head did not show emergent large vessel occlusion. CT scan of brain did not show acute intracranial abnormality. Radiology reported an age indeterminate left basal ganglia hypodensity suggestive of small vessel disease. She had not been on aspirin therapy prior to this presentation. She was seen and evaluated by neurology in the emergency department.                                                                Review of Systems:  Constitutional:  No weight loss, night sweats, Fevers, chills, fatigue.  HEENT:  No headaches, Difficulty swallowing,Tooth/dental problems,Sore throat,  No sneezing, itching, ear ache, nasal congestion, post nasal drip,  Cardio-vascular:  No chest pain, Orthopnea, PND, swelling in lower extremities, anasarca, dizziness, palpitations  GI:  No heartburn, indigestion, abdominal pain, nausea, vomiting, diarrhea, change in bowel habits, loss of appetite  Resp:  No shortness of breath with exertion or at rest. No excess mucus, no productive cough, No non-productive cough, No coughing up of blood.No change in color of mucus.No wheezing.No chest wall  deformity  Skin:  no rash or lesions.  GU:  no dysuria, change in color of urine, no urgency or frequency. No flank pain.  Musculoskeletal:  No joint pain or swelling. No decreased range of motion. No back pain.  Psych:  No change in mood or affect. No depression or anxiety. No memory loss.   Past Medical History  Diagnosis Date  . Hypertension   . Gastritis   . Hyperlipidemia   . Kidney stones    Past Surgical History  Procedure Laterality Date  . Elbow surgery Right    Social History:  reports that she has never smoked. She has never used smokeless tobacco. She reports that she does not drink alcohol or use illicit drugs.  No Known Allergies  Family History  Problem Relation Age of Onset  . Hypertension Mother   . Heart disease Mother   . Heart disease Father     MI  . Stomach cancer Maternal Aunt   . Colon cancer Neg Hx      Prior to Admission medications   Medication Sig Start Date End Date Taking? Authorizing Provider  cyclobenzaprine (FLEXERIL) 10 MG tablet Take 1 tablet (10 mg total) by mouth 3 (three) times daily as needed for muscle spasms. 04/16/15  Yes Chipper Herb, MD  olmesartan-hydrochlorothiazide (BENICAR HCT) 40-25 MG per tablet Take 1 tablet by mouth daily.   Yes Historical Provider, MD  traMADol (ULTRAM) 50 MG  tablet Take 1 tablet (50 mg total) by mouth every 8 (eight) hours as needed. 04/16/15  Yes Chipper Herb, MD   Physical Exam: Filed Vitals:   04/25/15 1100 04/25/15 1101 04/25/15 1115 04/25/15 1127  BP: 185/105  141/91   Pulse: 104  94   Temp:  97.7 F (36.5 C)  97.7 F (36.5 C)  TempSrc:  Oral    Resp: 16  16   Height:  5\' 4"  (1.626 m)    Weight:  73.086 kg (161 lb 2 oz)    SpO2: 98%  100%     Wt Readings from Last 3 Encounters:  04/25/15 73.086 kg (161 lb 2 oz)  04/22/15 72.576 kg (160 lb)  04/16/15 70.761 kg (156 lb)    General:  Appears calm and comfortable, has right-sided facial droop with some slurred speech Eyes: PERRL,  normal lids, irises & conjunctiva ENT: grossly normal hearing, lips & tongue, there was mild right-sided tongue deviation Neck: no LAD, masses or thyromegaly Cardiovascular: RRR, no m/r/g. No LE edema. Telemetry: SR, no arrhythmias  Respiratory: CTA bilaterally, no w/r/r. Normal respiratory effort. Abdomen: soft, ntnd Skin: no rash or induration seen on limited exam Musculoskeletal: grossly normal tone BUE/BLE Psychiatric: grossly normal mood and affect, speech fluent and appropriate Neurologic: Patient having right-sided facial droop associated with some slurred speech. As noted above she has mild right-sided tongue deviation. Neck is supple symmetrical. She has 3-5 muscle strength to her right upper extremity and 1 out of 5 muscle strength to her right lower extremity. There was alteration to sensation involving right hand and right leg. Add 2+ bilateral deep tendon reflexes           Labs on Admission:  Basic Metabolic Panel:  Recent Labs Lab 04/25/15 1026 04/25/15 1036  NA 136 137  K 4.2 4.2  CL 99* 100*  CO2 29  --   GLUCOSE 101* 102*  BUN 9 11  CREATININE 0.83 0.90  CALCIUM 9.3  --    Liver Function Tests:  Recent Labs Lab 04/25/15 1026  AST 22  ALT 11*  ALKPHOS 98  BILITOT 0.4  PROT 6.8  ALBUMIN 3.7   No results for input(s): LIPASE, AMYLASE in the last 168 hours. No results for input(s): AMMONIA in the last 168 hours. CBC:  Recent Labs Lab 04/25/15 1026 04/25/15 1036  WBC 6.3  --   NEUTROABS 3.8  --   HGB 13.4 14.6  HCT 41.5 43.0  MCV 91.6  --   PLT 266  --    Cardiac Enzymes: No results for input(s): CKTOTAL, CKMB, CKMBINDEX, TROPONINI in the last 168 hours.  BNP (last 3 results) No results for input(s): BNP in the last 8760 hours.  ProBNP (last 3 results) No results for input(s): PROBNP in the last 8760 hours.  CBG: No results for input(s): GLUCAP in the last 168 hours.  Radiological Exams on Admission: Ct Angio Head W/cm &/or Wo  Cm  04/25/2015   CLINICAL DATA:  64 year old female code stroke with right side weakness. Woke up with symptoms. Initial encounter.  EXAM: CT ANGIOGRAPHY HEAD AND NECK  TECHNIQUE: Multidetector CT imaging of the head and neck was performed using the standard protocol during bolus administration of intravenous contrast. Multiplanar CT image reconstructions and MIPs were obtained to evaluate the vascular anatomy. Carotid stenosis measurements (when applicable) are obtained utilizing NASCET criteria, using the distal internal carotid diameter as the denominator.  CONTRAST:  172mL OMNIPAQUE IOHEXOL 350 MG/ML SOLN  COMPARISON:  Head CT without contrast from today reported separately.  FINDINGS: CT HEAD  Reported separately today.  CTA NECK  Skeleton:  No acute osseous abnormality identified.  Other neck: Negative lung apices. Mild perihilar bronchiectasis. No superior mediastinal lymphadenopathy.  Mild thyroid enlargement. Up to 10 mm bilateral thyroid nodules, do not meet size criteria for ultrasound follow-up. Larynx, pharynx, parapharyngeal spaces, retropharyngeal space, sublingual space, submandibular glands, and parotid glands are within normal limits. Negative orbits.  No cervical lymphadenopathy.  Aortic arch: Bovine arch configuration. No significant arch atherosclerosis. No great vessel origin stenosis.  Right carotid system: Mildly tortuous but widely patent right CCA. Widely patent right carotid bifurcation. Negative cervical right ICA.  Left carotid system: Mildly tortuous left CCA without stenosis. Tortuous but widely patent left carotid bifurcation. Negative cervical left ICA.  Vertebral arteries:No proximal subclavian artery stenosis. Both vertebral artery origins are within normal limits. Codominant vertebral arteries with no stenosis in the neck.  CTA HEAD  Posterior circulation: Patent codominant distal vertebral arteries. Normal PICA origins. Patent vertebrobasilar junction. No basilar artery stenosis.  AICA and SCA origins are patent. PCA origins are normal. Posterior communicating arteries are diminutive or absent. Bilateral PCA branches are within normal limits.  Anterior circulation: Bilateral ICA siphon calcified plaque. Both siphons are patent. No hemodynamically significant carotid siphon stenosis occurs. Ophthalmic artery origins are normal. Both carotid termini are patent.  Normal MCA and ACA origins. Anterior communicating artery and bilateral ACA branches are within normal limits. Right MCA M1 segment, bifurcation, and right MCA branches are within normal limits.  Left MCA M1 segment, left MCA bifurcation, and left MCA branches are within normal limits.  Venous sinuses: Patent.  Anatomic variants: None.  Delayed phase: No abnormal enhancement identified.  IMPRESSION: 1. No emergent large vessel occlusion. Preliminary report of these findings discussed in person with Dr. Aram Beecham at 1053 hours. 2. No significant stenosis. ICA siphon calcified plaque, with little to no atherosclerosis elsewhere. 3. CT head without contrast reported separately.   Electronically Signed   By: Genevie Ann M.D.   On: 04/25/2015 11:28   Ct Head Wo Contrast  04/25/2015   ADDENDUM REPORT: 04/25/2015 11:12  ADDENDUM: Study discussed by telephone with Dr. Dorie Rank on 04/25/2015 at 1102 hours.   Electronically Signed   By: Genevie Ann M.D.   On: 04/25/2015 11:12   04/25/2015   CLINICAL DATA:  64 year old female who awoke with stroke symptoms, right-sided neurologic deficits. Code stroke. Initial encounter.  EXAM: CT HEAD WITHOUT CONTRAST  TECHNIQUE: Contiguous axial images were obtained from the base of the skull through the vertex without intravenous contrast.  COMPARISON:  Head CT without contrast 12/21/2003.  FINDINGS: No acute osseous abnormality identified. Visualized paranasal sinuses and mastoids are clear. Orbit and scalp soft tissues appear normal.  Calcified atherosclerosis at the skull base. Mild generalized cerebral volume loss  since 2005. No ventriculomegaly. No midline shift, mass effect, or evidence of intracranial mass lesion. No acute intracranial hemorrhage identified. No acute cortically based infarct identified. There is subcentimeter hypodensity in the left lentiform nuclei which is new but age indeterminate (series 2, image 27). No suspicious intracranial vascular hyperdensity.  IMPRESSION: No acute cortically based infarct or acute intracranial hemorrhage identified.  Mild age indeterminate left basal ganglia hypodensity suggestive of small vessel disease.  Electronically Signed: By: Genevie Ann M.D. On: 04/25/2015 10:57   Ct Angio Neck W/cm &/or Wo/cm  04/25/2015   CLINICAL DATA:  64 year old female code stroke with right  side weakness. Woke up with symptoms. Initial encounter.  EXAM: CT ANGIOGRAPHY HEAD AND NECK  TECHNIQUE: Multidetector CT imaging of the head and neck was performed using the standard protocol during bolus administration of intravenous contrast. Multiplanar CT image reconstructions and MIPs were obtained to evaluate the vascular anatomy. Carotid stenosis measurements (when applicable) are obtained utilizing NASCET criteria, using the distal internal carotid diameter as the denominator.  CONTRAST:  114mL OMNIPAQUE IOHEXOL 350 MG/ML SOLN  COMPARISON:  Head CT without contrast from today reported separately.  FINDINGS: CT HEAD  Reported separately today.  CTA NECK  Skeleton:  No acute osseous abnormality identified.  Other neck: Negative lung apices. Mild perihilar bronchiectasis. No superior mediastinal lymphadenopathy.  Mild thyroid enlargement. Up to 10 mm bilateral thyroid nodules, do not meet size criteria for ultrasound follow-up. Larynx, pharynx, parapharyngeal spaces, retropharyngeal space, sublingual space, submandibular glands, and parotid glands are within normal limits. Negative orbits.  No cervical lymphadenopathy.  Aortic arch: Bovine arch configuration. No significant arch atherosclerosis. No great  vessel origin stenosis.  Right carotid system: Mildly tortuous but widely patent right CCA. Widely patent right carotid bifurcation. Negative cervical right ICA.  Left carotid system: Mildly tortuous left CCA without stenosis. Tortuous but widely patent left carotid bifurcation. Negative cervical left ICA.  Vertebral arteries:No proximal subclavian artery stenosis. Both vertebral artery origins are within normal limits. Codominant vertebral arteries with no stenosis in the neck.  CTA HEAD  Posterior circulation: Patent codominant distal vertebral arteries. Normal PICA origins. Patent vertebrobasilar junction. No basilar artery stenosis. AICA and SCA origins are patent. PCA origins are normal. Posterior communicating arteries are diminutive or absent. Bilateral PCA branches are within normal limits.  Anterior circulation: Bilateral ICA siphon calcified plaque. Both siphons are patent. No hemodynamically significant carotid siphon stenosis occurs. Ophthalmic artery origins are normal. Both carotid termini are patent.  Normal MCA and ACA origins. Anterior communicating artery and bilateral ACA branches are within normal limits. Right MCA M1 segment, bifurcation, and right MCA branches are within normal limits.  Left MCA M1 segment, left MCA bifurcation, and left MCA branches are within normal limits.  Venous sinuses: Patent.  Anatomic variants: None.  Delayed phase: No abnormal enhancement identified.  IMPRESSION: 1. No emergent large vessel occlusion. Preliminary report of these findings discussed in person with Dr. Aram Beecham at 1053 hours. 2. No significant stenosis. ICA siphon calcified plaque, with little to no atherosclerosis elsewhere. 3. CT head without contrast reported separately.   Electronically Signed   By: Genevie Ann M.D.   On: 04/25/2015 11:28    EKG: Independently reviewed.   Assessment/Plan Principal Problem:   CVA (cerebral infarction) Active Problems:   HTN (hypertension)   1. Acute CVA.  Patient with history of hypertension presenting with right-sided weakness, right-sided facial droop, who present had not been on antiplatelets therapy. She has last seen normal at 11 PM yesterday evening by her family members. She reported having several transient episodes of right-sided weakness earlier this week. CT scan of brain did not show acute intracranial abnormalities. Patient was seen and evaluated by neurology who recommended admission for CVA workup. Will start antiplatelets therapy with aspirin 325 mg by mouth daily, obtain MRA/MRI of brain, carotid Dopplers, transthoracic echocardiogram. She will be placed on the CVA protocol. Consult physical therapy, occupational therapy and speech pathology. 2. Hypertension. Will allow for permissive hypertension to favor cerebral perfusion as she presents with acute CVA. Provide as needed labetalol for systolic blood pressures greater than 580 and diastolic  blood pressures greater than 120.  3. DVT prophylaxis. Lovenox    Code Status: Full code Family Communication: I spoke with family members present at bedside Disposition Plan: Will admit to the inpatient service, to his facial require greater than 2 nights hospitalization  Time spent: 60 minutes  Kelvin Cellar [facility] Pager [phone]

## 2015-04-25 NOTE — Telephone Encounter (Signed)
Call given to triage

## 2015-04-25 NOTE — Code Documentation (Signed)
Code Stroke called at 1000am  Patient arrived at 81 via EMS.  Stat labs and head CT done.  Upon interview LKW changed to last night at 1100pm before she went to bed.  This morning she awoke about 7am with right side weakness.  Per patient she has a history of sciatica, but this feels different.  NIHSS 4,  Right arm with drift, right leg with minimal movement against gravity, decreased sensory on right.  CTA done.  Dr Armida Sans at bedside to assess patient.

## 2015-04-25 NOTE — Consult Note (Signed)
Referring Physician: Tomi Bamberger    Chief Complaint: Code stroke  HPI:                                                                                                                                         Krista Finley is an 64 y.o. female who states she has been having waxing and waning weakness of her right arm and leg over the last 3 days. Last night she went to sleep at 2300 hours and felt normal.  This AM at 0700 hours she awoke and noted her right arm and leg were weak.  EMS was called and patient was brought to [facility] as a code stroke. On arrival she had no notable weakness of right arm, inconsistent right facial droop, no aphasia and right leg weakness. CT/CTA head and neck performed, personally reviewed, and were unremarkable. Denies HA, vertigo, double vision, slurred speech, language or vision impairment.  Date last known well: Date: 04/24/2015 Time last known well: Time: 23:00  NIHSS 4 tPA Given: No: out of window  Past Medical History  Diagnosis Date  . Hypertension   . Gastritis   . Hyperlipidemia   . Kidney stones     Past Surgical History  Procedure Laterality Date  . Elbow surgery Right     Family History  Problem Relation Age of Onset  . Hypertension Mother   . Heart disease Mother   . Heart disease Father     MI  . Stomach cancer Maternal Aunt   . Colon cancer Neg Hx    Social History:  reports that she has never smoked. She has never used smokeless tobacco. She reports that she does not drink alcohol or use illicit drugs. Family history:no epilepsy, MS, or brain tumor. Allergies: No Known Allergies  Medications:                                                                                                                           No current facility-administered medications for this encounter.   Current Outpatient Prescriptions  Medication Sig Dispense Refill  . benzonatate (TESSALON) 200 MG capsule Take 1 capsule (200 mg total) by mouth 3 (three)  times daily as needed. 30 capsule 1  . cyclobenzaprine (FLEXERIL) 10 MG tablet Take 1 tablet (10 mg total) by mouth 3 (three) times daily as needed for muscle spasms. Latta  tablet 1  . olmesartan-hydrochlorothiazide (BENICAR HCT) 40-25 MG per tablet Take 1 tablet by mouth daily.    . traMADol (ULTRAM) 50 MG tablet Take 1 tablet (50 mg total) by mouth every 8 (eight) hours as needed. 21 tablet 0     ROS:                                                                                                                                       History obtained from the patient  General ROS: negative for - chills, fatigue, fever, night sweats, weight gain or weight loss Psychological ROS: negative for - behavioral disorder, hallucinations, memory difficulties, mood swings or suicidal ideation Ophthalmic ROS: negative for - blurry vision, double vision, eye pain or loss of vision ENT ROS: negative for - epistaxis, nasal discharge, oral lesions, sore throat, tinnitus or vertigo Allergy and Immunology ROS: negative for - hives or itchy/watery eyes Hematological and Lymphatic ROS: negative for - bleeding problems, bruising or swollen lymph nodes Endocrine ROS: negative for - galactorrhea, hair pattern changes, polydipsia/polyuria or temperature intolerance Respiratory ROS: negative for - cough, hemoptysis, shortness of breath or wheezing Cardiovascular ROS: negative for - chest pain, dyspnea on exertion, edema or irregular heartbeat Gastrointestinal ROS: negative for - abdominal pain, diarrhea, hematemesis, nausea/vomiting or stool incontinence Genito-Urinary ROS: negative for - dysuria, hematuria, incontinence or urinary frequency/urgency Musculoskeletal ROS: negative for - joint swelling or muscular weakness Neurological ROS: as noted in HPI Dermatological ROS: negative for rash and skin lesion changes  Physical Examination:                                                                                                       SpO2 100 %.  HEENT-  Normocephalic, no lesions, without obvious abnormality.  Normal external eye and conjunctiva.  Normal TM's bilaterally.  Normal auditory canals and external ears. Normal external nose, mucus membranes and septum.  Normal pharynx. Cardiovascular- S1, S2 normal, pulses palpable throughout   Lungs- chest clear, no wheezing, rales, normal symmetric air entry Abdomen- normal findings: bowel sounds normal Extremities- no edema Lymph-no adenopathy palpable Musculoskeletal-no joint tenderness, deformity or swelling Skin-warm and dry, no hyperpigmentation, vitiligo, or suspicious lesions  Neurological Examination Mental Status: Alert, oriented, thought content appropriate.  Speech fluent without evidence of aphasia.  Able to follow 3 step commands without difficulty. Cranial Nerves: II: Discs flat bilaterally; Visual fields grossly normal, pupils equal, round, reactive to light and accommodation III,IV, VI: ptosis not present,  extra-ocular motions intact bilaterally V,VII: smile symmetric --inconsistent atributes, facial light touch sensation normal bilaterally VIII: hearing normal bilaterally IX,X: uvula rises symmetrically XI: bilateral shoulder shrug XII: midline tongue extension Motor: Right : Upper extremity   5/5    Left:     Upper extremity   5/5  Lower extremity   4/5     Lower extremity   5/5 Tone and bulk:normal tone throughout; no atrophy noted Sensory: Pinprick and light touch intact throughout, bilaterally Deep Tendon Reflexes: 2+ and symmetric throughout Plantars: Right: downgoing   Left: downgoing Cerebellar: normal finger-to-nose and normal heel-to-shin test Gait: not tested due to safety       Lab Results: Basic Metabolic Panel:  Recent Labs Lab 04/25/15 1036  NA 137  K 4.2  CL 100*  GLUCOSE 102*  BUN 11  CREATININE 0.90    Liver Function Tests: No results for input(s): AST, ALT, ALKPHOS, BILITOT, PROT, ALBUMIN in  the last 168 hours. No results for input(s): LIPASE, AMYLASE in the last 168 hours. No results for input(s): AMMONIA in the last 168 hours.  CBC:  Recent Labs Lab 04/25/15 1026 04/25/15 1036  WBC 6.3  --   NEUTROABS 3.8  --   HGB 13.4 14.6  HCT 41.5 43.0  MCV 91.6  --   PLT 266  --     Cardiac Enzymes: No results for input(s): CKTOTAL, CKMB, CKMBINDEX, TROPONINI in the last 168 hours.  Lipid Panel: No results for input(s): CHOL, TRIG, HDL, CHOLHDL, VLDL, LDLCALC in the last 168 hours.  CBG: No results for input(s): GLUCAP in the last 168 hours.  Microbiology: No results found for this or any previous visit.  Coagulation Studies:  Recent Labs  04/25/15 1026  LABPROT 13.0  INR 0.96    Imaging: No results found.     Assessment and plan discussed with with attending physician and they are in agreement.    Etta Quill PA-C [facility] [phone]  04/25/2015, 10:52 AM   Assessment: 64 y.o. female presenting to ED with right sided weakness with onset time of 2300 hours the day before. Exam is notable for right leg weakness, inconsistent right arm and face weakness. NIHSS 4, CT head was negative for acute stroke or bleed. CTA head was negative for large vessel occlusion. At this time cannot rule out acute left subcortical ischemic stroke.   Stroke Risk Factors - hyperlipidemia and hypertension  Recommend: 1. HgbA1c, fasting lipid panel 2. MRI, MRA  of the brain without contrast 3. PT consult, OT consult, Speech consult 4. Echocardiogram 5. Carotid dopplers 6. Prophylactic therapy-Antiplatelet med: Aspirin - dose 325 mg daily 7. Risk factor modification 8. Telemetry monitoring 9. Frequent neuro checks 10 NPO until passes stroke swallow screen

## 2015-04-25 NOTE — ED Notes (Signed)
Attempted report to 5M.  

## 2015-04-25 NOTE — Progress Notes (Signed)
Echocardiogram 2D Echocardiogram has been performed.  Krista Finley 04/25/2015, 3:42 PM

## 2015-04-25 NOTE — Progress Notes (Signed)
Pt received at this time with no noted distress. Pt stable, neuro intact. Family at bedside. Pt oriented to room. Safety measures in place. Call bell within reach. Will continue to monitor. Report received by Elmyra Ricks, RN prior to arrival to unit.

## 2015-04-26 ENCOUNTER — Encounter: Payer: Self-pay | Admitting: *Deleted

## 2015-04-26 DIAGNOSIS — E785 Hyperlipidemia, unspecified: Secondary | ICD-10-CM | POA: Diagnosis present

## 2015-04-26 DIAGNOSIS — Z006 Encounter for examination for normal comparison and control in clinical research program: Secondary | ICD-10-CM

## 2015-04-26 LAB — CBC
HCT: 41.4 % (ref 36.0–46.0)
Hemoglobin: 13.3 g/dL (ref 12.0–15.0)
MCH: 29.5 pg (ref 26.0–34.0)
MCHC: 32.1 g/dL (ref 30.0–36.0)
MCV: 91.8 fL (ref 78.0–100.0)
PLATELETS: 300 10*3/uL (ref 150–400)
RBC: 4.51 MIL/uL (ref 3.87–5.11)
RDW: 13.5 % (ref 11.5–15.5)
WBC: 6.5 10*3/uL (ref 4.0–10.5)

## 2015-04-26 LAB — BASIC METABOLIC PANEL
Anion gap: 11 (ref 5–15)
BUN: 12 mg/dL (ref 6–20)
CALCIUM: 9 mg/dL (ref 8.9–10.3)
CHLORIDE: 101 mmol/L (ref 101–111)
CO2: 25 mmol/L (ref 22–32)
CREATININE: 0.86 mg/dL (ref 0.44–1.00)
GFR calc non Af Amer: >60 mL/min (ref >60)
GLUCOSE: 119 mg/dL — AB (ref 65–99)
Potassium: 4.2 mmol/L (ref 3.5–5.1)
Sodium: 137 mmol/L (ref 135–145)

## 2015-04-26 LAB — LIPID PANEL
CHOL/HDL RATIO: 2.9 ratio
CHOLESTEROL: 217 mg/dL — AB (ref 0–200)
HDL: 74 mg/dL (ref >40)
LDL Cholesterol: 116 mg/dL — ABNORMAL HIGH (ref 0–99)
TRIGLYCERIDES: 134 mg/dL (ref <150)
VLDL: 27 mg/dL (ref 0–40)

## 2015-04-26 MED ORDER — ATORVASTATIN CALCIUM 10 MG PO TABS
20.0000 mg | ORAL_TABLET | Freq: Every day | ORAL | Status: DC
  Administered 2015-04-26: 20 mg via ORAL
  Filled 2015-04-26 (×2): qty 2

## 2015-04-26 NOTE — Evaluation (Signed)
Physical Therapy Evaluation Patient Details Name: Krista Finley MRN: [id number] DOB: 1951/05/11 Today's Date: 04/26/2015   History of Present Illness  Pt adm with rt sided weakness. MRI - Lt CVA. PMH - HTN  Clinical Impression  Pt admitted with above diagnosis and presents to PT with functional limitations due to deficits listed below (See PT problem list). Pt needs skilled PT to maximize independence and safety to allow discharge to CIR to allow pt to reach maximum potential.      Follow Up Recommendations CIR    Equipment Recommendations  Rolling walker with 5" wheels    Recommendations for Other Services       Precautions / Restrictions Precautions Precautions: Fall      Mobility  Bed Mobility Overal bed mobility: Needs Assistance Bed Mobility: Supine to Sit     Supine to sit: Min assist     General bed mobility comments: Pt required assist to bring trunk up  Transfers Overall transfer level: Needs assistance Equipment used: Rolling walker (2 wheeled) Transfers: Sit to/from [facility] Sit to Stand: Min assist Stand pivot transfers: Min assist       General transfer comment: Assist to bring hips up and for balance  Ambulation/Gait Ambulation/Gait assistance: Min assist Ambulation Distance (Feet): 150 Feet Assistive device: Rolling walker (2 wheeled) Gait Pattern/deviations: Step-to pattern;Decreased step length - right;Decreased dorsiflexion - right Gait velocity: decr Gait velocity interpretation: Below normal speed for age/gender General Gait Details: Assist for balance. Pt needed incr effort to bring rt foot through. Scuffing rt foot on floor with swing phase. Verbal cues to look up and not only look at feet.  Stairs            Wheelchair Mobility    Modified Rankin (Stroke Patients Only) Modified Rankin (Stroke Patients Only) Pre-Morbid Rankin Score: No symptoms Modified Rankin: Moderately severe disability      Balance Overall balance assessment: Needs assistance Sitting-balance support: Bilateral upper extremity supported;Feet supported Sitting balance-Leahy Scale: Poor Sitting balance - Comments: sat EOB with supervision but uses UE's and with posterior lean Postural control: Posterior lean Standing balance support: Bilateral upper extremity supported Standing balance-Leahy Scale: Poor Standing balance comment: UE support for static standing                             Pertinent Vitals/Pain Pain Assessment: No/denies pain    Home Living Family/patient expects to be discharged to:: Private residence Living Arrangements: Parent;Children;Other relatives Available Help at Discharge: Family;Available 24 hours/day Type of Home: House Home Access: Stairs to enter Entrance Stairs-Rails: Right Entrance Stairs-Number of Steps: 3-4   Home Equipment: None      Prior Function Level of Independence: Independent         Comments: Works at [employer] Hand: Left    Extremity/Trunk Assessment   Upper Extremity Assessment: Defer to OT evaluation           Lower Extremity Assessment: RLE deficits/detail RLE Deficits / Details: strength 4/5       Communication   Communication: No difficulties  Cognition Arousal/Alertness: Awake/alert Behavior During Therapy: WFL for tasks assessed/performed Overall Cognitive Status: Impaired/Different from baseline Area of Impairment: Problem solving             Problem Solving: Slow processing      General Comments      Exercises  Assessment/Plan    PT Assessment Patient needs continued PT services  PT Diagnosis Difficulty walking;Hemiplegia non-dominant side;Abnormality of gait   PT Problem List Decreased strength;Decreased activity tolerance;Decreased balance;Decreased mobility;Decreased knowledge of use of DME;Impaired sensation  PT Treatment Interventions DME  instruction;Gait training;Stair training;Therapeutic exercise;Therapeutic activities;Functional mobility training;Balance training;Neuromuscular re-education;Patient/family education   PT Goals (Current goals can be found in the Care Plan section) Acute Rehab PT Goals Patient Stated Goal: return home and to work PT Goal Formulation: With patient Time For Goal Achievement: 05/03/15 Potential to Achieve Goals: Good    Frequency Min 4X/week   Barriers to discharge        Co-evaluation               End of Session Equipment Utilized During Treatment: Gait belt Activity Tolerance: Patient tolerated treatment well Patient left: in chair;with call bell/phone within reach;with chair alarm set;with family/visitor present Nurse Communication: Mobility status         Time: 7096-4383 PT Time Calculation (min) (ACUTE ONLY): 20 min   Charges:   PT Evaluation $Initial PT Evaluation Tier I: 1 Procedure     PT G Codes:        Maripat Borba 05-24-2015, 2:47 PM  [facility] PT [phone]

## 2015-04-26 NOTE — Progress Notes (Signed)
Utilization review completed. Nawaf Strange, RN, BSN. 

## 2015-04-26 NOTE — Progress Notes (Signed)
SLP Cancellation Note  Patient Details Name: MARILENE VATH MRN: [id number] DOB: 19-Sep-1950   Cancelled treatment:       Reason Eval/Treat Not Completed: SLP screened, no needs identified, will sign off   Juan Quam Laurice 04/26/2015, 1:04 PM

## 2015-04-26 NOTE — Progress Notes (Unsigned)
Spoke with patient/family about STROKE-AF Research study. Informed consent and contact numbers given to patient/family. Questions encouraged and answered. Research team will call patient on Tuesday for participation decision. 10 days for inclusion criteria will end Saturday 05/04/15.

## 2015-04-26 NOTE — Progress Notes (Signed)
STROKE TEAM PROGRESS NOTE   SUBJECTIVE (INTERVAL HISTORY) Her family members are at the bedside.  Overall she feels her condition is completely resolved. Right arm and leg at baseline now. Stated that symptoms started 3 days ago, on and off. Yesterday morning constant until this morning.    OBJECTIVE Temp:  [97.2 F (36.2 C)-98.4 F (36.9 C)] 98.3 F (36.8 C) (09/02 1827) Pulse Rate:  [86-104] 98 (09/02 1827) Cardiac Rhythm:  [-] Sinus tachycardia (09/02 1918) Resp:  [16-18] 18 (09/02 1827) BP: (104-132)/(65-87) 106/66 mmHg (09/02 1827) SpO2:  [96 %-100 %] 96 % (09/02 1827)   Recent Labs Lab 04/25/15 1101  GLUCAP 90    Recent Labs Lab 04/25/15 1026 04/25/15 1036 04/26/15 0434  NA 136 137 137  K 4.2 4.2 4.2  CL 99* 100* 101  CO2 29  --  25  GLUCOSE 101* 102* 119*  BUN 9 11 12   CREATININE 0.83 0.90 0.86  CALCIUM 9.3  --  9.0    Recent Labs Lab 04/25/15 1026  AST 22  ALT 11*  ALKPHOS 98  BILITOT 0.4  PROT 6.8  ALBUMIN 3.7    Recent Labs Lab 04/25/15 1026 04/25/15 1036 04/26/15 0434  WBC 6.3  --  6.5  NEUTROABS 3.8  --   --   HGB 13.4 14.6 13.3  HCT 41.5 43.0 41.4  MCV 91.6  --  91.8  PLT 266  --  300   No results for input(s): CKTOTAL, CKMB, CKMBINDEX, TROPONINI in the last 168 hours.  Recent Labs  04/25/15 1026  LABPROT 13.0  INR 0.96    Recent Labs  04/25/15 1231  COLORURINE YELLOW  LABSPEC 1.026  PHURINE 7.5  GLUCOSEU NEGATIVE  HGBUR SMALL*  BILIRUBINUR NEGATIVE  KETONESUR NEGATIVE  PROTEINUR NEGATIVE  UROBILINOGEN 0.2  NITRITE NEGATIVE  LEUKOCYTESUR SMALL*       Component Value Date/Time   CHOL 217* 04/26/2015 0434   TRIG 134 04/26/2015 0434   HDL 74 04/26/2015 0434   CHOLHDL 2.9 04/26/2015 0434   VLDL 27 04/26/2015 0434   LDLCALC 116* 04/26/2015 0434   No results found for: HGBA1C    Component Value Date/Time   LABOPIA NONE DETECTED 04/25/2015 1231   COCAINSCRNUR NONE DETECTED 04/25/2015 1231   LABBENZ NONE DETECTED  04/25/2015 1231   AMPHETMU NONE DETECTED 04/25/2015 1231   THCU NONE DETECTED 04/25/2015 1231   LABBARB NONE DETECTED 04/25/2015 1231     Recent Labs Lab 04/25/15 1026  ETH <5    I have personally reviewed the radiological images below and agree with the radiology interpretations.  Ct Angio Head and neck W/cm &/or Wo Cm  04/25/2015  IMPRESSION: 1. No emergent large vessel occlusion. Preliminary report of these findings discussed in person with Dr. Aram Beecham at 1053 hours. 2. No significant stenosis. ICA siphon calcified plaque, with little to no atherosclerosis elsewhere. 3. CT head without contrast reported separately.     Ct Head Wo Contrast  04/25/2015   IMPRESSION: No acute cortically based infarct or acute intracranial hemorrhage identified.  Mild age indeterminate left basal ganglia hypodensity suggestive of small vessel disease.     Mr Brain Wo Contrast  04/25/2015   IMPRESSION: Acute nonhemorrhagic infarct extends from the posterior left lenticular nucleus to posterior left corona radiata.  Very mild small vessel disease type changes.     2D Echocardiogram  - Left ventricle: The cavity size was normal. Systolic function was normal. The estimated ejection fraction was in the range of  55% to 60%. Wall motion was normal; there were no regional wall motion abnormalities. Doppler parameters are consistent with abnormal left ventricular relaxation (grade 1 diastolic dysfunction). - Mitral valve: Calcified annulus. Impressions: - No cardiac source of emboli was indentified.  PHYSICAL EXAM  Temp:  [97.2 F (36.2 C)-98.4 F (36.9 C)] 98.3 F (36.8 C) (09/02 1827) Pulse Rate:  [86-104] 98 (09/02 1827) Resp:  [16-18] 18 (09/02 1827) BP: (104-132)/(65-87) 106/66 mmHg (09/02 1827) SpO2:  [96 %-100 %] 96 % (09/02 1827)  General - Well nourished, well developed, in no apparent distress.  Ophthalmologic - Sharp disc margins OU.  Cardiovascular - Regular rate and rhythm  with no murmur.  Neck - supple, no carotid bruits  Mental Status -  Level of arousal and orientation to time, place, and person were intact. Language including expression, naming, repetition, comprehension was assessed and found intact. Fund of Knowledge was assessed and was intact.  Cranial Nerves II - XII - II - Visual field intact OU. III, IV, VI - Extraocular movements intact. V - Facial sensation intact bilaterally. VII - Facial movement intact bilaterally. VIII - Hearing & vestibular intact bilaterally. X - Palate elevates symmetrically. XI - Chin turning & shoulder shrug intact bilaterally. XII - Tongue protrusion intact.  Motor Strength - The patient's strength was normal in all extremities and pronator drift was absent.  Bulk was normal and fasciculations were absent.   Motor Tone - Muscle tone was assessed at the neck and appendages and was normal.  Reflexes - The patient's reflexes were symmetrical in all extremities and she had no pathological reflexes.  Sensory - Light touch, temperature/pinprick were assessed and were symmetrical.    Coordination - The patient had normal movements in the hands and feet with no ataxia or dysmetria.  Tremor was absent.  Gait and Station - deferred due to fatigue with PT/OT.   ASSESSMENT/PLAN [name] is a 64 y.o. female with history of HTN and HLD admitted for right arm and leg weakness on and off for 3 days. Symptoms resolved.    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 Continue ASA on discharge  Patient counseled to be compliant with her antithrombotic medications  Ongoing aggressive  stroke risk factor management  Hypertension  Home meds:   benicar and HCTZ Permissive hypertension (OK if <220/120) for 24-48 hours post stroke and then gradually normalized within 5-7 days. Currently on labetalol PRN  Stable  Patient counseled to be compliant with her blood pressure medications  Hyperlipidemia  Home meds:  none   Currently on lipitor 20mg   LDL 116, goal < 70  Continue statin at discharge  Other Stroke Risk Factors    Other Active Problems  STROKE AF trial candidate.  Hospital day # 1  Neurology will sign off. Please call with questions. Pt will follow up with Dr. Erlinda Hong at [facility] in about 2 months. Thanks for the consult.   Rosalin Hawking, MD PhD Stroke Neurology 04/26/2015 7:47 PM    To contact Stroke Continuity provider, please refer to [email or web address] After hours, contact General Neurology

## 2015-04-26 NOTE — Progress Notes (Signed)
[facility] PROGRESS NOTE   Krista Finley SWH:[id number] DOB: 1950-11-27 DOA: 04/25/2015 PCP: Sharion Balloon, FNP  HPI/Subjective: Seen was daughter at bedside  Assessment/Plan: Principal Problem:   CVA (cerebral infarction) Active Problems:   HTN (hypertension)   Acute CVA Patient presented to the hospital with right-sided weakness and right facial droop. MRI of the brain showed findings consistent with acute stroke. Neurology consulted. Placed on aspirin 325 mg. The rest of the stroke workup to be done.  Hypertension Will allow permissive hypertension, blood pressure actually controlled.  Hyperlipidemia Total cholesterol 217 and LDL is 116, goal of less than 70. Added Lipitor 20 mg.  Code Status: Full Code Family Communication: Plan discussed with the patient. Disposition Plan: Remains inpatient Diet: Diet regular Room service appropriate?: Yes; Fluid consistency:: Thin  Consultants:  Neurology  Procedures:  Echo and carotid duplex pending  Antibiotics:  None   Objective: Filed Vitals:   04/26/15 1040  BP: 108/68  Pulse: 86  Temp: 98 F (36.7 C)  Resp: 17    Intake/Output Summary (Last 24 hours) at 04/26/15 1258 Last data filed at 04/26/15 0900  Gross per 24 hour  Intake    240 ml  Output    800 ml  Net   -560 ml   Filed Weights   04/25/15 1101  Weight: 73.086 kg (161 lb 2 oz)    Exam: General: Alert and awake, oriented x3, not in any acute distress. HEENT: anicteric sclera, pupils reactive to light and accommodation, EOMI CVS: S1-S2 clear, no murmur rubs or gallops Chest: clear to auscultation bilaterally, no wheezing, rales or rhonchi Abdomen: soft nontender, nondistended, normal bowel sounds, no organomegaly Extremities: no cyanosis, clubbing or edema noted bilaterally Neuro: Cranial nerves II-XII intact, no focal neurological deficits  Data Reviewed: Basic Metabolic Panel:  Recent Labs Lab 04/25/15 1026  04/25/15 1036 04/26/15 0434  NA 136 137 137  K 4.2 4.2 4.2  CL 99* 100* 101  CO2 29  --  25  GLUCOSE 101* 102* 119*  BUN 9 11 12   CREATININE 0.83 0.90 0.86  CALCIUM 9.3  --  9.0   Liver Function Tests:  Recent Labs Lab 04/25/15 1026  AST 22  ALT 11*  ALKPHOS 98  BILITOT 0.4  PROT 6.8  ALBUMIN 3.7   No results for input(s): LIPASE, AMYLASE in the last 168 hours. No results for input(s): AMMONIA in the last 168 hours. CBC:  Recent Labs Lab 04/25/15 1026 04/25/15 1036 04/26/15 0434  WBC 6.3  --  6.5  NEUTROABS 3.8  --   --   HGB 13.4 14.6 13.3  HCT 41.5 43.0 41.4  MCV 91.6  --  91.8  PLT 266  --  300   Cardiac Enzymes: No results for input(s): CKTOTAL, CKMB, CKMBINDEX, TROPONINI in the last 168 hours. BNP (last 3 results) No results for input(s): BNP in the last 8760 hours.  ProBNP (last 3 results) No results for input(s): PROBNP in the last 8760 hours.  CBG:  Recent Labs Lab 04/25/15 1101  GLUCAP 90    Micro No results found for this or any previous visit (from the past 240 hour(s)).   Studies: Ct Angio Head W/cm &/or Wo Cm  04/25/2015   CLINICAL DATA:  64 year old female code stroke with right side weakness. Woke up with symptoms. Initial encounter.  EXAM: CT ANGIOGRAPHY HEAD AND NECK  TECHNIQUE: Multidetector CT imaging of the head and neck was performed using the standard protocol during bolus administration  of intravenous contrast. Multiplanar CT image reconstructions and MIPs were obtained to evaluate the vascular anatomy. Carotid stenosis measurements (when applicable) are obtained utilizing NASCET criteria, using the distal internal carotid diameter as the denominator.  CONTRAST:  188mL OMNIPAQUE IOHEXOL 350 MG/ML SOLN  COMPARISON:  Head CT without contrast from today reported separately.  FINDINGS: CT HEAD  Reported separately today.  CTA NECK  Skeleton:  No acute osseous abnormality identified.  Other neck: Negative lung apices. Mild perihilar  bronchiectasis. No superior mediastinal lymphadenopathy.  Mild thyroid enlargement. Up to 10 mm bilateral thyroid nodules, do not meet size criteria for ultrasound follow-up. Larynx, pharynx, parapharyngeal spaces, retropharyngeal space, sublingual space, submandibular glands, and parotid glands are within normal limits. Negative orbits.  No cervical lymphadenopathy.  Aortic arch: Bovine arch configuration. No significant arch atherosclerosis. No great vessel origin stenosis.  Right carotid system: Mildly tortuous but widely patent right CCA. Widely patent right carotid bifurcation. Negative cervical right ICA.  Left carotid system: Mildly tortuous left CCA without stenosis. Tortuous but widely patent left carotid bifurcation. Negative cervical left ICA.  Vertebral arteries:No proximal subclavian artery stenosis. Both vertebral artery origins are within normal limits. Codominant vertebral arteries with no stenosis in the neck.  CTA HEAD  Posterior circulation: Patent codominant distal vertebral arteries. Normal PICA origins. Patent vertebrobasilar junction. No basilar artery stenosis. AICA and SCA origins are patent. PCA origins are normal. Posterior communicating arteries are diminutive or absent. Bilateral PCA branches are within normal limits.  Anterior circulation: Bilateral ICA siphon calcified plaque. Both siphons are patent. No hemodynamically significant carotid siphon stenosis occurs. Ophthalmic artery origins are normal. Both carotid termini are patent.  Normal MCA and ACA origins. Anterior communicating artery and bilateral ACA branches are within normal limits. Right MCA M1 segment, bifurcation, and right MCA branches are within normal limits.  Left MCA M1 segment, left MCA bifurcation, and left MCA branches are within normal limits.  Venous sinuses: Patent.  Anatomic variants: None.  Delayed phase: No abnormal enhancement identified.  IMPRESSION: 1. No emergent large vessel occlusion. Preliminary report  of these findings discussed in person with Dr. Aram Beecham at 1053 hours. 2. No significant stenosis. ICA siphon calcified plaque, with little to no atherosclerosis elsewhere. 3. CT head without contrast reported separately.   Electronically Signed   By: Genevie Ann M.D.   On: 04/25/2015 11:28   Ct Head Wo Contrast  04/25/2015   ADDENDUM REPORT: 04/25/2015 11:12  ADDENDUM: Study discussed by telephone with Dr. Dorie Rank on 04/25/2015 at 1102 hours.   Electronically Signed   By: Genevie Ann M.D.   On: 04/25/2015 11:12   04/25/2015   CLINICAL DATA:  64 year old female who awoke with stroke symptoms, right-sided neurologic deficits. Code stroke. Initial encounter.  EXAM: CT HEAD WITHOUT CONTRAST  TECHNIQUE: Contiguous axial images were obtained from the base of the skull through the vertex without intravenous contrast.  COMPARISON:  Head CT without contrast 12/21/2003.  FINDINGS: No acute osseous abnormality identified. Visualized paranasal sinuses and mastoids are clear. Orbit and scalp soft tissues appear normal.  Calcified atherosclerosis at the skull base. Mild generalized cerebral volume loss since 2005. No ventriculomegaly. No midline shift, mass effect, or evidence of intracranial mass lesion. No acute intracranial hemorrhage identified. No acute cortically based infarct identified. There is subcentimeter hypodensity in the left lentiform nuclei which is new but age indeterminate (series 2, image 55). No suspicious intracranial vascular hyperdensity.  IMPRESSION: No acute cortically based infarct or acute intracranial hemorrhage identified.  Mild age indeterminate left basal ganglia hypodensity suggestive of small vessel disease.  Electronically Signed: By: Genevie Ann M.D. On: 04/25/2015 10:57   Ct Angio Neck W/cm &/or Wo/cm  04/25/2015   CLINICAL DATA:  64 year old female code stroke with right side weakness. Woke up with symptoms. Initial encounter.  EXAM: CT ANGIOGRAPHY HEAD AND NECK  TECHNIQUE: Multidetector CT imaging  of the head and neck was performed using the standard protocol during bolus administration of intravenous contrast. Multiplanar CT image reconstructions and MIPs were obtained to evaluate the vascular anatomy. Carotid stenosis measurements (when applicable) are obtained utilizing NASCET criteria, using the distal internal carotid diameter as the denominator.  CONTRAST:  157mL OMNIPAQUE IOHEXOL 350 MG/ML SOLN  COMPARISON:  Head CT without contrast from today reported separately.  FINDINGS: CT HEAD  Reported separately today.  CTA NECK  Skeleton:  No acute osseous abnormality identified.  Other neck: Negative lung apices. Mild perihilar bronchiectasis. No superior mediastinal lymphadenopathy.  Mild thyroid enlargement. Up to 10 mm bilateral thyroid nodules, do not meet size criteria for ultrasound follow-up. Larynx, pharynx, parapharyngeal spaces, retropharyngeal space, sublingual space, submandibular glands, and parotid glands are within normal limits. Negative orbits.  No cervical lymphadenopathy.  Aortic arch: Bovine arch configuration. No significant arch atherosclerosis. No great vessel origin stenosis.  Right carotid system: Mildly tortuous but widely patent right CCA. Widely patent right carotid bifurcation. Negative cervical right ICA.  Left carotid system: Mildly tortuous left CCA without stenosis. Tortuous but widely patent left carotid bifurcation. Negative cervical left ICA.  Vertebral arteries:No proximal subclavian artery stenosis. Both vertebral artery origins are within normal limits. Codominant vertebral arteries with no stenosis in the neck.  CTA HEAD  Posterior circulation: Patent codominant distal vertebral arteries. Normal PICA origins. Patent vertebrobasilar junction. No basilar artery stenosis. AICA and SCA origins are patent. PCA origins are normal. Posterior communicating arteries are diminutive or absent. Bilateral PCA branches are within normal limits.  Anterior circulation: Bilateral ICA  siphon calcified plaque. Both siphons are patent. No hemodynamically significant carotid siphon stenosis occurs. Ophthalmic artery origins are normal. Both carotid termini are patent.  Normal MCA and ACA origins. Anterior communicating artery and bilateral ACA branches are within normal limits. Right MCA M1 segment, bifurcation, and right MCA branches are within normal limits.  Left MCA M1 segment, left MCA bifurcation, and left MCA branches are within normal limits.  Venous sinuses: Patent.  Anatomic variants: None.  Delayed phase: No abnormal enhancement identified.  IMPRESSION: 1. No emergent large vessel occlusion. Preliminary report of these findings discussed in person with Dr. Aram Beecham at 1053 hours. 2. No significant stenosis. ICA siphon calcified plaque, with little to no atherosclerosis elsewhere. 3. CT head without contrast reported separately.   Electronically Signed   By: Genevie Ann M.D.   On: 04/25/2015 11:28   Mr Brain Wo Contrast  04/25/2015   CLINICAL DATA:  64 year old hypertensive female with hyperlipidemia presenting with right-sided weakness. Subsequent encounter.  EXAM: MRI HEAD WITHOUT CONTRAST  TECHNIQUE: Multiplanar, multiecho pulse sequences of the brain and surrounding structures were obtained without intravenous contrast.  COMPARISON:  04/25/2015 head CT.  FINDINGS: Acute nonhemorrhagic infarct extends from the posterior left lenticular nucleus to posterior left corona radiata.  Very mild small vessel disease type changes.  No hydrocephalus.  No intracranial hemorrhage.  No intracranial mass lesion noted on this unenhanced exam.  Major intracranial vascular structures are patent.  Minimal partial opacification left mastoid air cells without obstructing lesion of the eustachian tube.  Cervical medullary junction, pituitary region, pineal region and orbital structures unremarkable.  IMPRESSION: Acute nonhemorrhagic infarct extends from the posterior left lenticular nucleus to posterior left  corona radiata.  Very mild small vessel disease type changes.   Electronically Signed   By: Genia Del M.D.   On: 04/25/2015 22:33    Scheduled Meds: . aspirin  300 mg Rectal Daily   Or  . aspirin  325 mg Oral Daily  . enoxaparin (LOVENOX) injection  40 mg Subcutaneous Q24H   Continuous Infusions:      Time spent: 35 minutes    [facility]  [facility] Pager [phone] If 7PM-7AM, please contact night-coverage at [email or web address], password [facility] 04/26/2015, 12:58 PM  LOS: 1 day

## 2015-04-27 DIAGNOSIS — I63312 Cerebral infarction due to thrombosis of left middle cerebral artery: Secondary | ICD-10-CM

## 2015-04-27 DIAGNOSIS — E785 Hyperlipidemia, unspecified: Secondary | ICD-10-CM

## 2015-04-27 LAB — HEMOGLOBIN A1C
Hgb A1c MFr Bld: 5.5 % (ref 4.8–5.6)
Mean Plasma Glucose: 111 mg/dL

## 2015-04-27 MED ORDER — ALUM & MAG HYDROXIDE-SIMETH 200-200-20 MG/5ML PO SUSP
30.0000 mL | Freq: Once | ORAL | Status: AC
  Administered 2015-04-27: 30 mL via ORAL
  Filled 2015-04-27: qty 30

## 2015-04-27 MED ORDER — ASPIRIN 325 MG PO TABS: 325.0000 mg | ORAL_TABLET | Freq: Every day | ORAL | Status: DC

## 2015-04-27 MED ORDER — ATORVASTATIN CALCIUM 20 MG PO TABS: 20.0000 mg | ORAL_TABLET | Freq: Every day | ORAL | Status: DC

## 2015-04-27 MED ORDER — ALUM & MAG HYDROXIDE-SIMETH 200-200-20 MG/5ML PO SUSP
30.0000 mL | ORAL | Status: DC | PRN
  Administered 2015-04-27: 30 mL via ORAL
  Filled 2015-04-27: qty 30

## 2015-04-27 NOTE — Progress Notes (Signed)
Physical Therapy Treatment and Discharge Patient Details Name: Krista Finley MRN: [id number] DOB: 06-27-1951 Today's Date: 04/27/2015    History of Present Illness Pt adm with rt sided weakness. MRI - Lt CVA. PMH - HTN    PT Comments    Pt has progressed remarkably since yesterday's evaluation. Scored 56/56 on Berg balance scale as well as ambulating 400' without AD or physical assist from therapist. Pt does continue to have slowed reaction time and rather flat affect. Recommend no driving or return to work until f/u with physician. Given the physical nature of patient's job, recommend outpt PT for high level strengthening and improving reaction time. She would also benefit from FCE before returning to work. Pt has met acute PT goals, acute PT signing off, further needs to be met in outpt setting.    Follow Up Recommendations  Outpatient PT     Equipment Recommendations  None recommended by PT    Recommendations for Other Services       Precautions / Restrictions Precautions Precautions: None Restrictions Weight Bearing Restrictions: No    Mobility  Bed Mobility Overal bed mobility: Independent                Transfers Overall transfer level: Independent Equipment used: None Transfers: Sit to/from Stand Sit to Stand: Independent            Ambulation/Gait Ambulation/Gait assistance: Independent Ambulation Distance (Feet): 400 Feet Assistive device: None Gait Pattern/deviations: WFL(Within Functional Limits) Gait velocity: began slow, increased to [facility] Gait velocity interpretation: at or above normal speed for age/gender General Gait Details: pt did not require RW today, did not have LOB, did not drag right foot. Began with cautious gait of decreased pace but was able to increase pace to [facility] when encouraged   Stairs Stairs: Yes Stairs assistance: Modified independent (Device/Increase time) Stair Management: One rail Right;Alternating  pattern;Forwards Number of Stairs: 5 General stair comments: pt ascended with alternating pattern, descended with step to  Wheelchair Mobility    Modified Rankin (Stroke Patients Only) Modified Rankin (Stroke Patients Only) Pre-Morbid Rankin Score: No symptoms Modified Rankin: Slight disability     Balance     Sitting balance-Leahy Scale: Normal     Standing balance support: No upper extremity supported Standing balance-Leahy Scale: Good                      Cognition Arousal/Alertness: Awake/alert Behavior During Therapy: WFL for tasks assessed/performed Overall Cognitive Status: Within Functional Limits for tasks assessed Area of Impairment: Problem solving             Problem Solving: Slow processing General Comments: pt appropriate throughout session but still with slightly decreased reaction time. Discussed waiting to return to work at least a week to allow continues healing    Exercises      General Comments General comments (skin integrity, edema, etc.): practiced bkwd walking as pt steps onto and bkwds off of a step stool while at work      Pertinent Vitals/Pain Pain Assessment: No/denies pain    Home Living                      Prior Function            PT Goals (current goals can now be found in the care plan section) Acute Rehab PT Goals Patient Stated Goal: return home and to work PT Goal Formulation: With patient Time For Goal Achievement: 05/03/15  Potential to Achieve Goals: Good Progress towards PT goals: Goals met/education completed, patient discharged from PT    Frequency  Min 4X/week    PT Plan Discharge plan needs to be updated    Co-evaluation             End of Session   Activity Tolerance: Patient tolerated treatment well Patient left: in chair;with call bell/phone within reach     Time: 0829-0845 PT Time Calculation (min) (ACUTE ONLY): 16 min  Charges:  $Gait Training: 8-22 mins                     G Codes:     Leighton Roach, PT  Acute Rehab Services  [phone]  Leighton Roach 04/27/2015, 8:55 AM

## 2015-04-27 NOTE — Discharge Summary (Addendum)
Physician Discharge Summary  Krista Finley VQM:[id number] DOB: [birth year] DOA: 04/25/2015  PCP: Sharion Balloon, FNP  Admit date: 04/25/2015 Discharge date: 04/27/2015  Time spent: 40 minutes  Recommendations for Outpatient Follow-up:  1. Follow-up with primary care physician and stroke team as outpatient. 2. Please set up outpatient PT/OT.  Discharge Diagnoses:  Principal Problem:   CVA (cerebral infarction) Active Problems:   HTN (hypertension)   Dyslipidemia   Discharge Condition: Stable  Diet recommendation: Heart healthy  Filed Weights   04/25/15 1101  Weight: 73.086 kg (161 lb 2 oz)    History of present illness:  Krista Finley is a 64 y.o. female with a past medical history of hypertension presented to the emergency department with complaints of right-sided weakness. She reports having transient episode of right-sided weakness last Tuesday then had 3 transient episodes yesterday. She was last seen normal by her mother at 33 PM yesterday evening prior to her going sleep. This morning she woke up with significant right-sided weakness, right-sided facial droop and slurred speech. Deficits were severe enough to him. Her ability to ambulate. Her mother called EMS. She was brought to the emergency department as a code CVA. A CTA of head did not show emergent large vessel occlusion. CT scan of brain did not show acute intracranial abnormality. Radiology reported an age indeterminate left basal ganglia hypodensity suggestive of small vessel disease. She had not been on aspirin therapy prior to this presentation. She was seen and evaluated by neurology in the emergency department.  Hospital Course:   Acute CVA Patient presented to the hospital with right-sided weakness and right facial droop. MRI of the brain showed findings consistent with acute stroke. Neurology consulted. Placed on aspirin 325 mg. Continue blood pressure treatment  Hypertension Permissive blood  pressure control allowed initially. Discharge on her home medications.  Hyperlipidemia Total cholesterol 217 and LDL is 116, goal of less than 70. Added Lipitor 20 mg.   Procedures:  2-D echocardiogram showed no evidence of abnormalities.  Consultations:  Neurology  Discharge Exam: Filed Vitals:   04/27/15 0800  BP: 115/98  Pulse: 96  Temp: 98.1 F (36.7 C)  Resp: 18   General: Alert and awake, oriented x3, not in any acute distress. HEENT: anicteric sclera, pupils reactive to light and accommodation, EOMI CVS: S1-S2 clear, no murmur rubs or gallops Chest: clear to auscultation bilaterally, no wheezing, rales or rhonchi Abdomen: soft nontender, nondistended, normal bowel sounds, no organomegaly Extremities: no cyanosis, clubbing or edema noted bilaterally Neuro: Cranial nerves II-XII intact, no focal neurological deficits  Discharge Instructions   Discharge Instructions    Ambulatory referral to Neurology    Complete by:  As directed   Pt will follow up with Dr. Erlinda Hong at [facility] in about 2 months. Thanks.     Diet - low sodium heart healthy    Complete by:  As directed      Increase activity slowly    Complete by:  As directed           Current Discharge Medication List    START taking these medications   Details  aspirin 325 MG tablet Take 1 tablet (325 mg total) by mouth daily. Qty: 30 tablet, Refills: 0    atorvastatin (LIPITOR) 20 MG tablet Take 1 tablet (20 mg total) by mouth daily at 6 PM. Qty: 30 tablet, Refills: 0      CONTINUE these medications which have NOT CHANGED   Details  cyclobenzaprine (FLEXERIL) 10 MG  tablet Take 1 tablet (10 mg total) by mouth 3 (three) times daily as needed for muscle spasms. Qty: 30 tablet, Refills: 1   Associated Diagnoses: Right-sided low back pain with right-sided sciatica    olmesartan-hydrochlorothiazide (BENICAR HCT) 40-25 MG per tablet Take 1 tablet by mouth daily.    traMADol (ULTRAM) 50 MG tablet Take 1 tablet  (50 mg total) by mouth every 8 (eight) hours as needed. Qty: 21 tablet, Refills: 0   Associated Diagnoses: Right low back pain, with sciatica presence unspecified       No Known Allergies Follow-up Information    Follow up with Xu,Jindong, MD. Schedule an appointment as soon as possible for a visit in 2 months.   Specialty:  Neurology   Why:  stroke clinic   Contact information:   [address] [place] [address] [phone]       Follow up with Chevis Pretty, FNP In 1 week.   Specialty:  Nurse Practitioner   Contact information:   [place] [address] [phone]        The results of significant diagnostics from this hospitalization (including imaging, microbiology, ancillary and laboratory) are listed below for reference.    Significant Diagnostic Studies: Ct Angio Head W/cm &/or Wo Cm  04/25/2015   CLINICAL DATA:  63 year old female code stroke with right side weakness. Woke up with symptoms. Initial encounter.  EXAM: CT ANGIOGRAPHY HEAD AND NECK  TECHNIQUE: Multidetector CT imaging of the head and neck was performed using the standard protocol during bolus administration of intravenous contrast. Multiplanar CT image reconstructions and MIPs were obtained to evaluate the vascular anatomy. Carotid stenosis measurements (when applicable) are obtained utilizing NASCET criteria, using the distal internal carotid diameter as the denominator.  CONTRAST:  154mL OMNIPAQUE IOHEXOL 350 MG/ML SOLN  COMPARISON:  Head CT without contrast from today reported separately.  FINDINGS: CT HEAD  Reported separately today.  CTA NECK  Skeleton:  No acute osseous abnormality identified.  Other neck: Negative lung apices. Mild perihilar bronchiectasis. No superior mediastinal lymphadenopathy.  Mild thyroid enlargement. Up to 10 mm bilateral thyroid nodules, do not meet size criteria for ultrasound follow-up. Larynx, pharynx, parapharyngeal spaces,  retropharyngeal space, sublingual space, submandibular glands, and parotid glands are within normal limits. Negative orbits.  No cervical lymphadenopathy.  Aortic arch: Bovine arch configuration. No significant arch atherosclerosis. No great vessel origin stenosis.  Right carotid system: Mildly tortuous but widely patent right CCA. Widely patent right carotid bifurcation. Negative cervical right ICA.  Left carotid system: Mildly tortuous left CCA without stenosis. Tortuous but widely patent left carotid bifurcation. Negative cervical left ICA.  Vertebral arteries:No proximal subclavian artery stenosis. Both vertebral artery origins are within normal limits. Codominant vertebral arteries with no stenosis in the neck.  CTA HEAD  Posterior circulation: Patent codominant distal vertebral arteries. Normal PICA origins. Patent vertebrobasilar junction. No basilar artery stenosis. AICA and SCA origins are patent. PCA origins are normal. Posterior communicating arteries are diminutive or absent. Bilateral PCA branches are within normal limits.  Anterior circulation: Bilateral ICA siphon calcified plaque. Both siphons are patent. No hemodynamically significant carotid siphon stenosis occurs. Ophthalmic artery origins are normal. Both carotid termini are patent.  Normal MCA and ACA origins. Anterior communicating artery and bilateral ACA branches are within normal limits. Right MCA M1 segment, bifurcation, and right MCA branches are within normal limits.  Left MCA M1 segment, left MCA bifurcation, and left MCA branches are within normal limits.  Venous sinuses: Patent.  Anatomic variants: None.  Delayed phase: No abnormal enhancement identified.  IMPRESSION: 1. No emergent large vessel occlusion. Preliminary report of these findings discussed in person with Dr. Aram Beecham at 1053 hours. 2. No significant stenosis. ICA siphon calcified plaque, with little to no atherosclerosis elsewhere. 3. CT head without contrast reported  separately.   Electronically Signed   By: Genevie Ann M.D.   On: 04/25/2015 11:28   Dg Lumbar Spine 2-3 Views  04/17/2015   CLINICAL DATA:  Right-sided low back pain, no definitive radiculopathy no known injury, initial encounter  EXAM: LUMBAR SPINE - 2-3 VIEW  COMPARISON:  None.  FINDINGS: Five lumbar type vertebral bodies are well visualized. Vertebral body height is well maintained. No anterolisthesis is noted. Very minimal osteophytic changes are seen. No soft tissue abnormality is noted.  IMPRESSION: No acute abnormality noted.   Electronically Signed   By: Inez Catalina M.D.   On: 04/17/2015 07:45   Ct Head Wo Contrast  04/25/2015   ADDENDUM REPORT: 04/25/2015 11:12  ADDENDUM: Study discussed by telephone with Dr. Dorie Rank on 04/25/2015 at 1102 hours.   Electronically Signed   By: Genevie Ann M.D.   On: 04/25/2015 11:12   04/25/2015   CLINICAL DATA:  64 year old female who awoke with stroke symptoms, right-sided neurologic deficits. Code stroke. Initial encounter.  EXAM: CT HEAD WITHOUT CONTRAST  TECHNIQUE: Contiguous axial images were obtained from the base of the skull through the vertex without intravenous contrast.  COMPARISON:  Head CT without contrast 12/21/2003.  FINDINGS: No acute osseous abnormality identified. Visualized paranasal sinuses and mastoids are clear. Orbit and scalp soft tissues appear normal.  Calcified atherosclerosis at the skull base. Mild generalized cerebral volume loss since 2005. No ventriculomegaly. No midline shift, mass effect, or evidence of intracranial mass lesion. No acute intracranial hemorrhage identified. No acute cortically based infarct identified. There is subcentimeter hypodensity in the left lentiform nuclei which is new but age indeterminate (series 2, image 17). No suspicious intracranial vascular hyperdensity.  IMPRESSION: No acute cortically based infarct or acute intracranial hemorrhage identified.  Mild age indeterminate left basal ganglia hypodensity suggestive of  small vessel disease.  Electronically Signed: By: Genevie Ann M.D. On: 04/25/2015 10:57   Ct Angio Neck W/cm &/or Wo/cm  04/25/2015   CLINICAL DATA:  64 year old female code stroke with right side weakness. Woke up with symptoms. Initial encounter.  EXAM: CT ANGIOGRAPHY HEAD AND NECK  TECHNIQUE: Multidetector CT imaging of the head and neck was performed using the standard protocol during bolus administration of intravenous contrast. Multiplanar CT image reconstructions and MIPs were obtained to evaluate the vascular anatomy. Carotid stenosis measurements (when applicable) are obtained utilizing NASCET criteria, using the distal internal carotid diameter as the denominator.  CONTRAST:  121mL OMNIPAQUE IOHEXOL 350 MG/ML SOLN  COMPARISON:  Head CT without contrast from today reported separately.  FINDINGS: CT HEAD  Reported separately today.  CTA NECK  Skeleton:  No acute osseous abnormality identified.  Other neck: Negative lung apices. Mild perihilar bronchiectasis. No superior mediastinal lymphadenopathy.  Mild thyroid enlargement. Up to 10 mm bilateral thyroid nodules, do not meet size criteria for ultrasound follow-up. Larynx, pharynx, parapharyngeal spaces, retropharyngeal space, sublingual space, submandibular glands, and parotid glands are within normal limits. Negative orbits.  No cervical lymphadenopathy.  Aortic arch: Bovine arch configuration. No significant arch atherosclerosis. No great vessel origin stenosis.  Right carotid system: Mildly tortuous but widely patent right CCA. Widely patent right carotid bifurcation.  Negative cervical right ICA.  Left carotid system: Mildly tortuous left CCA without stenosis. Tortuous but widely patent left carotid bifurcation. Negative cervical left ICA.  Vertebral arteries:No proximal subclavian artery stenosis. Both vertebral artery origins are within normal limits. Codominant vertebral arteries with no stenosis in the neck.  CTA HEAD  Posterior circulation: Patent  codominant distal vertebral arteries. Normal PICA origins. Patent vertebrobasilar junction. No basilar artery stenosis. AICA and SCA origins are patent. PCA origins are normal. Posterior communicating arteries are diminutive or absent. Bilateral PCA branches are within normal limits.  Anterior circulation: Bilateral ICA siphon calcified plaque. Both siphons are patent. No hemodynamically significant carotid siphon stenosis occurs. Ophthalmic artery origins are normal. Both carotid termini are patent.  Normal MCA and ACA origins. Anterior communicating artery and bilateral ACA branches are within normal limits. Right MCA M1 segment, bifurcation, and right MCA branches are within normal limits.  Left MCA M1 segment, left MCA bifurcation, and left MCA branches are within normal limits.  Venous sinuses: Patent.  Anatomic variants: None.  Delayed phase: No abnormal enhancement identified.  IMPRESSION: 1. No emergent large vessel occlusion. Preliminary report of these findings discussed in person with Dr. Aram Beecham at 1053 hours. 2. No significant stenosis. ICA siphon calcified plaque, with little to no atherosclerosis elsewhere. 3. CT head without contrast reported separately.   Electronically Signed   By: Genevie Ann M.D.   On: 04/25/2015 11:28   Mr Brain Wo Contrast  04/25/2015   CLINICAL DATA:  64 year old hypertensive female with hyperlipidemia presenting with right-sided weakness. Subsequent encounter.  EXAM: MRI HEAD WITHOUT CONTRAST  TECHNIQUE: Multiplanar, multiecho pulse sequences of the brain and surrounding structures were obtained without intravenous contrast.  COMPARISON:  04/25/2015 head CT.  FINDINGS: Acute nonhemorrhagic infarct extends from the posterior left lenticular nucleus to posterior left corona radiata.  Very mild small vessel disease type changes.  No hydrocephalus.  No intracranial hemorrhage.  No intracranial mass lesion noted on this unenhanced exam.  Major intracranial vascular structures are  patent.  Minimal partial opacification left mastoid air cells without obstructing lesion of the eustachian tube.  Cervical medullary junction, pituitary region, pineal region and orbital structures unremarkable.  IMPRESSION: Acute nonhemorrhagic infarct extends from the posterior left lenticular nucleus to posterior left corona radiata.  Very mild small vessel disease type changes.   Electronically Signed   By: Genia Del M.D.   On: 04/25/2015 22:33    Microbiology: No results found for this or any previous visit (from the past 240 hour(s)).   Labs: Basic Metabolic Panel:  Recent Labs Lab 04/25/15 1026 04/25/15 1036 04/26/15 0434  NA 136 137 137  K 4.2 4.2 4.2  CL 99* 100* 101  CO2 29  --  25  GLUCOSE 101* 102* 119*  BUN 9 11 12   CREATININE 0.83 0.90 0.86  CALCIUM 9.3  --  9.0   Liver Function Tests:  Recent Labs Lab 04/25/15 1026  AST 22  ALT 11*  ALKPHOS 98  BILITOT 0.4  PROT 6.8  ALBUMIN 3.7   No results for input(s): LIPASE, AMYLASE in the last 168 hours. No results for input(s): AMMONIA in the last 168 hours. CBC:  Recent Labs Lab 04/25/15 1026 04/25/15 1036 04/26/15 0434  WBC 6.3  --  6.5  NEUTROABS 3.8  --   --   HGB 13.4 14.6 13.3  HCT 41.5 43.0 41.4  MCV 91.6  --  91.8  PLT 266  --  300   Cardiac Enzymes:  No results for input(s): CKTOTAL, CKMB, CKMBINDEX, TROPONINI in the last 168 hours. BNP: BNP (last 3 results) No results for input(s): BNP in the last 8760 hours.  ProBNP (last 3 results) No results for input(s): PROBNP in the last 8760 hours.  CBG:  Recent Labs Lab 04/25/15 1101  GLUCAP 90       Signed:  Tudor Chandley A  [facility] 04/27/2015, 9:45 AM

## 2015-04-30 ENCOUNTER — Ambulatory Visit (INDEPENDENT_AMBULATORY_CARE_PROVIDER_SITE_OTHER): Payer: BLUE CROSS/BLUE SHIELD | Admitting: Family

## 2015-04-30 ENCOUNTER — Encounter: Payer: Self-pay | Admitting: Family

## 2015-04-30 VITALS — BP 132/80 | HR 113 | Temp 97.6°F | Ht 64.0 in | Wt 154.0 lb

## 2015-04-30 DIAGNOSIS — I63312 Cerebral infarction due to thrombosis of left middle cerebral artery: Secondary | ICD-10-CM

## 2015-04-30 DIAGNOSIS — Z09 Encounter for follow-up examination after completed treatment for conditions other than malignant neoplasm: Secondary | ICD-10-CM | POA: Diagnosis not present

## 2015-04-30 DIAGNOSIS — E785 Hyperlipidemia, unspecified: Secondary | ICD-10-CM

## 2015-04-30 DIAGNOSIS — I10 Essential (primary) hypertension: Secondary | ICD-10-CM

## 2015-04-30 NOTE — Patient Instructions (Signed)
Rehabilitation After a Stroke A stroke can cause many types of problems. The treatment of stroke involves three stages: prevention, treatment immediately following a stroke, and rehabilitation after a stroke. HOW IS MY REHABILITATION PLAN DEVELOPED? A detailed exam by your health care provider helps outline what problems were caused by the stroke. Your health care provider may consult specialists. The specialists may include doctors, occupational and physical therapists, and speech therapists. It is then possible to make a plan that best fits your needs.  Your evaluation might include the following:  Evaluation of your ability to do daily activities that require using muscles, coordination, vision, reasoning, memory, and problem solving. Interviews with you and your health care provider will help determine what you could do and could not do before the stroke.  Evaluation of your ability to do personal self-care tasks, such as dressing, grooming, and eating.  Tests to see if there are sensory and motor changes due to the stroke, especially in the hands and legs. WHAT ARE THE TYPES OF REHABILITATION? Your health care provider may have you start rehabilitation right away depending on the type and severity of your stroke. Rehabilitation after stroke is focused on getting function back and preventing another stroke. Rehabilitation might include:   Physical therapy. This can include help with walking, sitting, lying down, and balance. It may also be designed to help prevent shortening of the muscles (contractures) and swelling (edema).  Occupational therapy. This therapy helps you to relearn skills needed for leading a normal life. These could include eating, using the restroom, dressing, and taking care of yourself. It helps to make you more independent.  Vision therapy. This can help you to retrain, strengthen, and improve your vision after a stroke.  Speech therapy. This can help to improve your  speech and communication skills. It also teaches you and your family members to cope with problems of being unable to communicate.  Cognitive therapy. This therapy can help with problems caused by lack of memory, attention, or concentration.  Psychological or psychiatric therapy. This can help you cope with problems of frustration and emotional problems that may develop after a stroke.  Document Released: 08/30/2007 Document Revised: 12/25/2013 Document Reviewed: 01/12/2013 [facility] Patient Information 2015 [place], [place]. This information is not intended to replace advice given to you by your health care provider. Make sure you discuss any questions you have with your health care provider. Ischemic Stroke A stroke (cerebrovascular accident) is the sudden death of brain tissue. It is a medical emergency. A stroke can cause permanent loss of brain function. This can cause problems with different parts of your body. A transient ischemic attack (TIA) is different because it does not cause permanent damage. A TIA is a short-lived problem of poor blood flow affecting a part of the brain. A TIA is also a serious problem because having a TIA greatly increases the chances of having a stroke. When symptoms first develop, you cannot know if the problem might be a stroke or a TIA. CAUSES  A stroke is caused by a decrease of oxygen supply to an area of your brain. It is usually the result of a small blood clot or collection of cholesterol or fat (plaque) that blocks blood flow in the brain. A stroke can also be caused by blocked or damaged carotid arteries.  RISK FACTORS  High blood pressure (hypertension).  High cholesterol.  Diabetes mellitus.  Heart disease.  The buildup of plaque in the blood vessels (peripheral artery disease or  atherosclerosis).  The buildup of plaque in the blood vessels providing blood and oxygen to the brain (carotid artery stenosis).  An abnormal heart rhythm (atrial  fibrillation).  Obesity.  Smoking.  Taking oral contraceptives (especially in combination with smoking).  Physical inactivity.  A diet high in fats, salt (sodium), and calories.  Alcohol use.  Use of illegal drugs (especially cocaine and methamphetamine).  Being African American.  Being over the age of 32.  Family history of stroke.  Previous history of blood clots, stroke, TIA, or heart attack.  Sickle cell disease. SYMPTOMS  These symptoms usually develop suddenly, or may be newly present upon awakening from sleep:  Sudden weakness or numbness of the face, arm, or leg, especially on one side of the body.  Sudden trouble walking or difficulty moving arms or legs.  Sudden confusion.  Sudden personality changes.  Trouble speaking (aphasia) or understanding.  Difficulty swallowing.  Sudden trouble seeing in one or both eyes.  Double vision.  Dizziness.  Loss of balance or coordination.  Sudden severe headache with no known cause.  Trouble reading or writing. DIAGNOSIS  Your health care provider can often determine the presence or absence of a stroke based on your symptoms, history, and physical exam. Computed tomography (CT) of the brain is usually performed to confirm the stroke, determine causes, and determine stroke severity. Other tests may be done to find the cause of the stroke. These tests may include:  Electrocardiography.  Continuous heart monitoring.  Echocardiography.  Carotid ultrasonography.  Magnetic resonance imaging (MRI).  A scan of the brain circulation.  Blood tests. PREVENTION  The risk of a stroke can be decreased by appropriately treating high blood pressure, high cholesterol, diabetes, heart disease, and obesity and by quitting smoking, limiting alcohol, and staying physically active. TREATMENT  Time is of the essence. It is important to seek treatment at the first sign of these symptoms because you may receive a medicine to  dissolve the clot (thrombolytic) that cannot be given if too much time has passed since your symptoms began. Even if you do not know when your symptoms began, get treatment as soon as possible as there are other treatment options available including oxygen, intravenous (IV) fluids, and medicines to thin the blood (anticoagulants). Treatment of stroke depends on the duration, severity, and cause of your symptoms. Medicines and dietary changes may be used to address diabetes, high blood pressure, and other risk factors. Physical, speech, and occupational therapists will assess you and work with you to improve any functions impaired by the stroke. Measures will be taken to prevent short-term and long-term complications, including infection from breathing foreign material into the lungs (aspiration pneumonia), blood clots in the legs, bedsores, and falls. Rarely, surgery may be needed to remove large blood clots or to open up blocked arteries. HOME CARE INSTRUCTIONS   Take medicines only as directed by your health care provider. Follow the directions carefully. Medicines may be used to control risk factors for a stroke. Be sure you understand all your medicine instructions.  You may be told to take a medicine to thin the blood, such as aspirin or the anticoagulant warfarin. Warfarin needs to be taken exactly as instructed.  Too much and too little warfarin are both dangerous. Too much warfarin increases the risk of bleeding. Too little warfarin continues to allow the risk for blood clots. While taking warfarin, you will need to have regular blood tests to measure your blood clotting time. These blood tests usually  include both the PT and INR tests. The PT and INR results allow your health care provider to adjust your dose of warfarin. The dose can change for many reasons. It is critically important that you take warfarin exactly as prescribed, and that you have your PT and INR levels drawn exactly as  directed.  Many foods, especially foods high in vitamin K, can interfere with warfarin and affect the PT and INR results. Foods high in vitamin K include spinach, kale, broccoli, cabbage, collard and turnip greens, brussels sprouts, peas, cauliflower, seaweed, and parsley, as well as beef and pork liver, green tea, and soybean oil. You should eat a consistent amount of foods high in vitamin K. Avoid major changes in your diet, or notify your health care provider before changing your diet. Arrange a visit with a dietitian to answer your questions.  Many medicines can interfere with warfarin and affect the PT and INR results. You must tell your health care provider about any and all medicines you take. This includes all vitamins and supplements. Be especially cautious with aspirin and anti-inflammatory medicines. Do not take or discontinue any prescribed or over-the-counter medicine except on the advice of your health care provider or pharmacist.  Warfarin can have side effects, such as excessive bruising or bleeding. You will need to hold pressure over cuts for longer than usual. Your health care provider or pharmacist will discuss other potential side effects.  Avoid sports or activities that may cause injury or bleeding.  Be mindful when shaving, flossing your teeth, or handling sharp objects.  Alcohol can change the body's ability to handle warfarin. It is best to avoid alcoholic drinks or consume only very small amounts while taking warfarin. Notify your health care provider if you change your alcohol intake.  Notify your dentist or other health care providers before procedures.  If swallow studies have determined that your swallowing reflex is present, you should eat healthy foods. Including 5 or more servings of fruits and vegetables a day may reduce the risk of stroke. Foods may need to be a certain consistency (soft or pureed), or small bites may need to be taken in order to avoid aspirating  or choking. Certain dietary changes may be advised to address high blood pressure, high cholesterol, diabetes, or obesity.  Food choices that are low in sodium, saturated fat, trans fat, and cholesterol are recommended to manage high blood pressure.  Food choies that are high in fiber, and low in saturated fat, trans fat, and cholesterol may control cholesterol levels.  Controlling carbohydrates and sugar intake is recommended to manage diabetes.  Reducing calorie intake and making food choices that are low in sodium, saturated fat, trans fat, and cholesterol are recommended to manage obesity.  Maintain a healthy weight.  Stay physically active. It is recommended that you get at least 30 minutes of activity on all or most days.  Do not use any tobacco products including cigarettes, chewing tobacco, or electronic cigarettes.  Limit alcohol use even if you are not taking warfarin. Moderate alcohol use is considered to be:  No more than 2 drinks each day for men.  No more than 1 drink each day for nonpregnant women.  Home safety. A safe home environment is important to reduce the risk of falls. Your health care provider may arrange for specialists to evaluate your home. Having grab bars in the bedroom and bathroom is often important. Your health care provider may arrange for equipment to be used at home,  such as raised toilets and a seat for the shower.  Physical, occupational, and speech therapy. Ongoing therapy may be needed to maximize your recovery after a stroke. If you have been advised to use a walker or a cane, use it at all times. Be sure to keep your therapy appointments.  Follow all instructions for follow-up with your health care provider. This is very important. This includes any referrals, physical therapy, rehabilitation, and lab tests. Proper follow-up can prevent another stroke from occurring. SEEK MEDICAL CARE IF:  You have personality changes.  You have difficulty  swallowing.  You are seeing double.  You have dizziness.  You have a fever.  You have skin breakdown. SEEK IMMEDIATE MEDICAL CARE IF:  Any of these symptoms may represent a serious problem that is an emergency. Do not wait to see if the symptoms will go away. Get medical help right away. Call your local emergency services (911 in [place] Do not drive yourself to the hospital.  You have sudden weakness or numbness of the face, arm, or leg, especially on one side of the body.  You have sudden trouble walking or difficulty moving arms or legs.  You have sudden confusion.  You have trouble speaking (aphasia) or understanding.  You have sudden trouble seeing in one or both eyes.  You have a loss of balance or coordination.  You have a sudden, severe headache with no known cause.  You have new chest pain or an irregular heartbeat.  You have a partial or total loss of consciousness. Document Released: 08/10/2005 Document Revised: 12/25/2013 Document Reviewed: 03/20/2012 [facility] Patient Information 2015 [place], [place]. This information is not intended to replace advice given to you by your health care provider. Make sure you discuss any questions you have with your health care provider.

## 2015-04-30 NOTE — Progress Notes (Signed)
Subjective:    Patient ID: Maurilio Lovely, female    DOB: 10-25-1950, 64 y.o.   MRN: [id number]  HPI Pt presents to the office today for hospital follow up. Pt had a CVA on 04/25/15 with right sided weakness. Pt has follow up appt with her Neurologists in 2 months. Pt states she is doing "better", but feels "light headed" and complaining of a intermittent headache over the last three days. Pt states her pain is a 8 out 10 when it occurs. Pt states she has taken Motrin with mild relief.   Hospital notes were reviewed.    Review of Systems  Constitutional: Negative.   HENT: Negative.   Eyes: Negative.   Respiratory: Negative.  Negative for shortness of breath.   Cardiovascular: Negative.  Negative for palpitations.  Gastrointestinal: Negative.   Endocrine: Negative.   Genitourinary: Negative.   Musculoskeletal: Negative.   Neurological: Negative.  Negative for headaches.  Hematological: Negative.   Psychiatric/Behavioral: Negative.   All other systems reviewed and are negative.      Objective:   Physical Exam  Constitutional: She is oriented to person, place, and time. She appears well-developed and well-nourished. No distress.  HENT:  Head: Normocephalic and atraumatic.  Right Ear: External ear normal.  Left Ear: External ear normal.  Nose: Nose normal.  Mouth/Throat: Oropharynx is clear and moist.  Eyes: Pupils are equal, round, and reactive to light.  Neck: Normal range of motion. Neck supple. No thyromegaly present.  Cardiovascular: Normal rate, regular rhythm, normal heart sounds and intact distal pulses.   No murmur heard. Pulmonary/Chest: Effort normal and breath sounds normal. No respiratory distress. She has no wheezes.  Abdominal: Soft. Bowel sounds are normal. She exhibits no distension. There is no tenderness.  Musculoskeletal: Normal range of motion. She exhibits no edema or tenderness.  strength and Grips equal, Pt's gait WNL, tandem gait unsteady at  times, patient able to rise from the sitting position with nogross abnormalities noted  Neurological: She is alert and oriented to person, place, and time. She has normal reflexes. No cranial nerve deficit. Gait abnormal.  Reflex Scores:      Tricep reflexes are 2+ on the right side and 2+ on the left side.      Bicep reflexes are 2+ on the right side and 2+ on the left side.      Brachioradialis reflexes are 2+ on the right side and 2+ on the left side.      Patellar reflexes are 2+ on the right side and 2+ on the left side.      Achilles reflexes are 2+ on the right side and 2+ on the left side. Skin: Skin is warm and dry.  Psychiatric: She has a normal mood and affect. Her behavior is normal. Judgment and thought content normal.  Vitals reviewed.     BP 132/80 mmHg  Pulse 113  Temp(Src) 97.6 F (36.4 C) (Oral)  Ht 5\' 4"  (1.626 m)  Wt 154 lb (69.854 kg)  BMI 26.42 kg/m2     Assessment & Plan:  1. Hospital discharge follow-up  2. Dyslipidemia -Pt was placed on Lipitor this weekend with a goal LDL<70  3. Cerebral infarction due to thrombosis of left middle cerebral artery - Ambulatory referral to Physical Therapy  4. Essential hypertension -Continue with low salt diet -Exercise  -Keep BP at goal  Keep follow up appts with neurologists If pt develops any new weakness, facial drooping, slurred speech, or gait problems to  go straight to ED RTO in 1 weeks- Note given to stay out of work until next visit  Evelina Dun, FNP

## 2015-05-01 ENCOUNTER — Telehealth: Payer: Self-pay | Admitting: Family

## 2015-05-01 NOTE — Telephone Encounter (Signed)
Patient states that she checked her BP today because she was feeling bad and her reading was 96/69. She states that she had taken her BP this morning. Advised patient to continue to monitor and if it drops lower to contact our office or go to the hospital. Also advised that I would send a message back to see if meds need to be adjusted and to also take her BP before taking meds in AM. Patient verbalized understanding

## 2015-05-01 NOTE — Telephone Encounter (Signed)
Patient aware that note has been corrected and put up front.

## 2015-05-02 NOTE — Telephone Encounter (Signed)
Continue to monitor BP- If continues to decrease will change mediation. Last visit pt's BP was 132/80

## 2015-05-02 NOTE — Telephone Encounter (Signed)
Patient aware.

## 2015-05-03 ENCOUNTER — Telehealth: Payer: Self-pay | Admitting: Family

## 2015-05-03 MED ORDER — OLMESARTAN MEDOXOMIL 40 MG PO TABS: 40.0000 mg | ORAL_TABLET | Freq: Every day | ORAL | Status: DC

## 2015-05-03 NOTE — Telephone Encounter (Signed)
Pt notified of Christy Hawks' recommendation Verbalizes understanding 

## 2015-05-03 NOTE — Telephone Encounter (Signed)
Pt called to inform hypotensive readings since OV Readings have been averaging 90/60 Pt states she is having some dizziness Please advise

## 2015-05-03 NOTE — Telephone Encounter (Signed)
Pt to stop Benicar/HCTZ and new rx sent of Benicar 40 mg daily. PT needs to follow up in 2 weeks to make sure BP is stable and lab work

## 2015-05-04 ENCOUNTER — Telehealth: Payer: Self-pay | Admitting: Family

## 2015-05-06 ENCOUNTER — Encounter: Payer: Self-pay | Admitting: Family

## 2015-05-06 ENCOUNTER — Ambulatory Visit (INDEPENDENT_AMBULATORY_CARE_PROVIDER_SITE_OTHER): Payer: BLUE CROSS/BLUE SHIELD | Admitting: Family

## 2015-05-06 VITALS — BP 104/67 | HR 99 | Temp 97.0°F | Ht 64.0 in | Wt 154.4 lb

## 2015-05-06 DIAGNOSIS — R42 Dizziness and giddiness: Secondary | ICD-10-CM

## 2015-05-06 DIAGNOSIS — I952 Hypotension due to drugs: Secondary | ICD-10-CM | POA: Diagnosis not present

## 2015-05-06 DIAGNOSIS — I10 Essential (primary) hypertension: Secondary | ICD-10-CM | POA: Diagnosis not present

## 2015-05-06 MED ORDER — METOPROLOL TARTRATE 25 MG PO TABS
25.0000 mg | ORAL_TABLET | Freq: Two times a day (BID) | ORAL | Status: DC
Start: 2015-05-06 — End: 2016-05-20

## 2015-05-06 NOTE — Telephone Encounter (Signed)
Patient given appointment for today with [facility].

## 2015-05-06 NOTE — Telephone Encounter (Signed)
Pt needs to be seen

## 2015-05-06 NOTE — Patient Instructions (Signed)
Hypotension As your heart beats, it forces blood through your arteries. This force is your blood pressure. If your blood pressure is too low for you to go about your normal activities or to support the organs of your body, you have hypotension. Hypotension is also referred to as low blood pressure. When your blood pressure becomes too low, you may not get enough blood to your brain. As a result, you may feel weak, feel lightheaded, or develop a rapid heart rate. In a more severe case, you may faint. CAUSES Various conditions can cause hypotension. These include:  Blood loss.  Dehydration.  Heart or endocrine problems.  Pregnancy.  Severe infection.  Not having a well-balanced diet filled with needed nutrients.  Severe allergic reactions (anaphylaxis). Some medicines, such as blood pressure medicine or water pills (diuretics), may lower your blood pressure below normal. Sometimes taking too much medicine or taking medicine not as directed can cause hypotension. TREATMENT  Hospitalization is sometimes required for hypotension if fluid or blood replacement is needed, if time is needed for medicines to wear off, or if further monitoring is needed. Treatment might include changing your diet, changing your medicines (including medicines aimed at raising your blood pressure), and use of support stockings. HOME CARE INSTRUCTIONS   Drink enough fluids to keep your urine clear or pale yellow.  Take your medicines as directed by your health care provider.  Get up slowly from reclining or sitting positions. This gives your blood pressure a chance to adjust.  Wear support stockings as directed by your health care provider.  Maintain a healthy diet by including nutritious food, such as fruits, vegetables, nuts, whole grains, and lean meats. SEEK MEDICAL CARE IF:  You have vomiting or diarrhea.  You have a fever for more than 2-3 days.  You feel more thirsty than usual.  You feel weak and  tired. SEEK IMMEDIATE MEDICAL CARE IF:   You have chest pain or a fast or irregular heartbeat.  You have a loss of feeling in some part of your body, or you lose movement in your arms or legs.  You have trouble speaking.  You become sweaty or feel lightheaded.  You faint. MAKE SURE YOU:   Understand these instructions.  Will watch your condition.  Will get help right away if you are not doing well or get worse. Document Released: 08/10/2005 Document Revised: 05/31/2013 Document Reviewed: 02/10/2013 [facility] Patient Information 2015 [place], [place]. This information is not intended to replace advice given to you by your health care provider. Make sure you discuss any questions you have with your health care provider. Hypertension Hypertension, commonly called high blood pressure, is when the force of blood pumping through your arteries is too strong. Your arteries are the blood vessels that carry blood from your heart throughout your body. A blood pressure reading consists of a higher number over a lower number, such as 110/72. The higher number (systolic) is the pressure inside your arteries when your heart pumps. The lower number (diastolic) is the pressure inside your arteries when your heart relaxes. Ideally you want your blood pressure below 120/80. Hypertension forces your heart to work harder to pump blood. Your arteries may become narrow or stiff. Having hypertension puts you at risk for heart disease, stroke, and other problems.  RISK FACTORS Some risk factors for high blood pressure are controllable. Others are not.  Risk factors you cannot control include:   Race. You may be at higher risk if you are African  American.  Age. Risk increases with age.  Gender. Men are at higher risk than women before age 71 years. After age 84, women are at higher risk than men. Risk factors you can control include:  Not getting enough exercise or physical activity.  Being  overweight.  Getting too much fat, sugar, calories, or salt in your diet.  Drinking too much alcohol. SIGNS AND SYMPTOMS Hypertension does not usually cause signs or symptoms. Extremely high blood pressure (hypertensive crisis) may cause headache, anxiety, shortness of breath, and nosebleed. DIAGNOSIS  To check if you have hypertension, your health care provider will measure your blood pressure while you are seated, with your arm held at the level of your heart. It should be measured at least twice using the same arm. Certain conditions can cause a difference in blood pressure between your right and left arms. A blood pressure reading that is higher than normal on one occasion does not mean that you need treatment. If one blood pressure reading is high, ask your health care provider about having it checked again. TREATMENT  Treating high blood pressure includes making lifestyle changes and possibly taking medicine. Living a healthy lifestyle can help lower high blood pressure. You may need to change some of your habits. Lifestyle changes may include:  Following the DASH diet. This diet is high in fruits, vegetables, and whole grains. It is low in salt, red meat, and added sugars.  Getting at least 2 hours of brisk physical activity every week.  Losing weight if necessary.  Not smoking.  Limiting alcoholic beverages.  Learning ways to reduce stress. If lifestyle changes are not enough to get your blood pressure under control, your health care provider may prescribe medicine. You may need to take more than one. Work closely with your health care provider to understand the risks and benefits. HOME CARE INSTRUCTIONS  Have your blood pressure rechecked as directed by your health care provider.   Take medicines only as directed by your health care provider. Follow the directions carefully. Blood pressure medicines must be taken as prescribed. The medicine does not work as well when you skip  doses. Skipping doses also puts you at risk for problems.   Do not smoke.   Monitor your blood pressure at home as directed by your health care provider. SEEK MEDICAL CARE IF:   You think you are having a reaction to medicines taken.  You have recurrent headaches or feel dizzy.  You have swelling in your ankles.  You have trouble with your vision. SEEK IMMEDIATE MEDICAL CARE IF:  You develop a severe headache or confusion.  You have unusual weakness, numbness, or feel faint.  You have severe chest or abdominal pain.  You vomit repeatedly.  You have trouble breathing. MAKE SURE YOU:   Understand these instructions.  Will watch your condition.  Will get help right away if you are not doing well or get worse. Document Released: 08/10/2005 Document Revised: 12/25/2013 Document Reviewed: 06/02/2013 [facility] Patient Information 2015 [place], [place]. This information is not intended to replace advice given to you by your health care provider. Make sure you discuss any questions you have with your health care provider.

## 2015-05-06 NOTE — Telephone Encounter (Signed)
Patient BP has still been running low and her heart rate had increased to 109 please advise.

## 2015-05-06 NOTE — Progress Notes (Signed)
   Subjective:    Patient ID: Krista Finley, female    DOB: 01-09-1951, 64 y.o.   MRN: [id number]  HPI Pt presents to the office today for hypotension, dizziness, and headache. 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 We stopped the HCTZ last week because of the hypotension. Pt states she checks BP at home 3-4 times a day at home and her average 80's/60's. Pt states she just does not "feel good".    Review of Systems  Constitutional: Negative.   HENT: Negative.   Eyes: Negative.   Respiratory: Negative.  Negative for shortness of breath.   Cardiovascular: Negative.  Negative for palpitations.  Gastrointestinal: Negative.   Endocrine: Negative.   Genitourinary: Negative.   Musculoskeletal: Negative.   Neurological: Negative.  Negative for headaches.  Hematological: Negative.   Psychiatric/Behavioral: Negative.   All other systems reviewed and are negative.      Objective:   Physical Exam  Constitutional: She is oriented to person, place, and time. She appears well-developed and well-nourished. No distress.  HENT:  Head: Normocephalic and atraumatic.  Right Ear: External ear normal.  Left Ear: External ear normal.  Nose: Nose normal.  Mouth/Throat: Oropharynx is clear and moist.  Eyes: Pupils are equal, round, and reactive to light.  Neck: Normal range of motion. Neck supple. No thyromegaly present.  Cardiovascular: Normal rate, regular rhythm, normal heart sounds and intact distal pulses.   No murmur heard. Pulmonary/Chest: Effort normal and breath sounds normal. No respiratory distress. She has no wheezes.  Abdominal: Soft. Bowel sounds are normal. She exhibits no distension. There is no tenderness.  Musculoskeletal: Normal range of motion. She exhibits no edema or tenderness.  Neurological: She is alert and oriented to person, place, and time. She has normal reflexes. No cranial nerve deficit.  Skin: Skin is warm and dry.  Psychiatric: She  has a normal mood and affect. Her behavior is normal. Judgment and thought content normal.  Vitals reviewed.   BP 104/67 mmHg  Pulse 99  Temp(Src) 97 F (36.1 C) (Oral)  Ht $R'5\' 4"'gA$  (1.626 m)  Wt 154 lb 6.4 oz (70.035 kg)  BMI 26.49 kg/m2       Assessment & Plan:  1. Essential hypertension -Pt's Benicar stopped today because of hypotension (And pt states she would like to try a cheaper option)  Metoprolol 25 mg BID started today -Daily blood pressure log given with instructions on how to fill out and told to bring to next visit -Dash diet information given -Exercise encouraged - Stress Management  -Continue current meds -RTO in 1 week  - metoprolol tartrate (LOPRESSOR) 25 MG tablet; Take 1 tablet (25 mg total) by mouth 2 (two) times daily.  Dispense: 180 tablet; Refill: 3 - CBC with Differential/Platelet - CMP14+EGFR  2. Hypotension due to drugs -Falls risks discussed - CBC with Differential/Platelet - CMP14+EGFR  3. Dizziness Falls risks discussed - CBC with Differential/Platelet - CMP14+EGFR  Evelina Dun, FNP

## 2015-05-07 ENCOUNTER — Telehealth: Payer: Self-pay | Admitting: Nurse Practitioner

## 2015-05-07 LAB — CBC WITH DIFFERENTIAL/PLATELET
BASOS ABS: 0 10*3/uL (ref 0.0–0.2)
BASOS: 0 %
EOS (ABSOLUTE): 0 10*3/uL (ref 0.0–0.4)
Eos: 1 %
Hematocrit: 34.2 % (ref 34.0–46.6)
Hemoglobin: 11.3 g/dL (ref 11.1–15.9)
IMMATURE GRANS (ABS): 0 10*3/uL (ref 0.0–0.1)
Immature Granulocytes: 0 %
LYMPHS ABS: 2 10*3/uL (ref 0.7–3.1)
Lymphs: 32 %
MCH: 29.2 pg (ref 26.6–33.0)
MCHC: 33 g/dL (ref 31.5–35.7)
MCV: 88 fL (ref 79–97)
MONOS ABS: 0.4 10*3/uL (ref 0.1–0.9)
Monocytes: 6 %
NEUTROS ABS: 3.8 10*3/uL (ref 1.4–7.0)
Neutrophils: 61 %
PLATELETS: 333 10*3/uL (ref 150–379)
RBC: 3.87 x10E6/uL (ref 3.77–5.28)
RDW: 13.2 % (ref 12.3–15.4)
WBC: 6.2 10*3/uL (ref 3.4–10.8)

## 2015-05-07 LAB — CMP14+EGFR
A/G RATIO: 1.5 (ref 1.1–2.5)
ALK PHOS: 102 IU/L (ref 39–117)
ALT: 6 IU/L (ref 0–32)
AST: 11 IU/L (ref 0–40)
Albumin: 4 g/dL (ref 3.6–4.8)
BILIRUBIN TOTAL: 0.3 mg/dL (ref 0.0–1.2)
BUN / CREAT RATIO: 20 (ref 11–26)
BUN: 18 mg/dL (ref 8–27)
CHLORIDE: 102 mmol/L (ref 97–108)
CO2: 25 mmol/L (ref 18–29)
Calcium: 9.6 mg/dL (ref 8.7–10.3)
Creatinine, Ser: 0.88 mg/dL (ref 0.57–1.00)
GFR calc non Af Amer: 71 mL/min/{1.73_m2} (ref >59)
GFR, EST AFRICAN AMERICAN: 82 mL/min/{1.73_m2} (ref >59)
Globulin, Total: 2.7 g/dL (ref 1.5–4.5)
Glucose: 105 mg/dL — ABNORMAL HIGH (ref 65–99)
POTASSIUM: 4.6 mmol/L (ref 3.5–5.2)
Sodium: 143 mmol/L (ref 134–144)
TOTAL PROTEIN: 6.7 g/dL (ref 6.0–8.5)

## 2015-05-09 ENCOUNTER — Telehealth: Payer: Self-pay | Admitting: Nurse Practitioner

## 2015-05-13 ENCOUNTER — Ambulatory Visit: Payer: BLUE CROSS/BLUE SHIELD | Admitting: Physical Therapy

## 2015-05-13 NOTE — Telephone Encounter (Signed)
[employer], forms will be done 05/15/15, pt also aware

## 2015-05-21 ENCOUNTER — Other Ambulatory Visit: Payer: Self-pay

## 2015-05-21 MED ORDER — ATORVASTATIN CALCIUM 20 MG PO TABS: 20.0000 mg | ORAL_TABLET | Freq: Every day | ORAL | Status: DC

## 2015-05-23 DIAGNOSIS — Z0289 Encounter for other administrative examinations: Secondary | ICD-10-CM

## 2015-05-31 ENCOUNTER — Other Ambulatory Visit: Payer: Self-pay | Admitting: *Deleted

## 2015-05-31 MED ORDER — ATORVASTATIN CALCIUM 20 MG PO TABS: 20.0000 mg | ORAL_TABLET | Freq: Every day | ORAL | Status: DC

## 2015-06-11 ENCOUNTER — Ambulatory Visit (INDEPENDENT_AMBULATORY_CARE_PROVIDER_SITE_OTHER): Payer: BLUE CROSS/BLUE SHIELD | Admitting: Neurology

## 2015-06-11 ENCOUNTER — Encounter: Payer: Self-pay | Admitting: Neurology

## 2015-06-11 VITALS — BP 154/85 | HR 75 | Ht 64.0 in | Wt 158.6 lb

## 2015-06-11 DIAGNOSIS — E785 Hyperlipidemia, unspecified: Secondary | ICD-10-CM | POA: Insufficient documentation

## 2015-06-11 DIAGNOSIS — I10 Essential (primary) hypertension: Secondary | ICD-10-CM | POA: Insufficient documentation

## 2015-06-11 DIAGNOSIS — I63312 Cerebral infarction due to thrombosis of left middle cerebral artery: Secondary | ICD-10-CM | POA: Diagnosis not present

## 2015-06-11 NOTE — Patient Instructions (Signed)
-   continue ASA and lipitor for stroke prevention - check BP at home - Follow up with your primary care physician for stroke risk factor modification. Recommend maintain blood pressure goal <130/80, diabetes with hemoglobin A1c goal below 6.5% and lipids with LDL cholesterol goal below 70 mg/dL.  - healthy diet and regular exercise - follow up in 4 months

## 2015-06-11 NOTE — Progress Notes (Signed)
STROKE NEUROLOGY FOLLOW UP NOTE  NAME: Krista Finley DOB: [birth year]  REASON FOR VISIT: stroke follow up HISTORY FROM: pt and chart  Today we had the pleasure of seeing Krista Finley in follow-up at our Neurology Clinic. Pt was accompanied by no one.   History Summary [name] is a 64 y.o. female with history of HTN and HLD was admitted for right arm and leg weakness on and off for 3 days. Symptoms resolved during admission. MRI showed left BG infarct. CTA head and neck, 2D echo and A1C unremarkable, LDL 116. She was discharged on ASA and lipitor.   Interval History During the interval time, the patient has been doing well. No recurrent symptoms. BP at home 130s as per pt. Today in clinic 154/85. On ASA and lipitor, no neuro deficit. Denies smoking, illicit drug or symptoms of OSA.    REVIEW OF SYSTEMS: Full 14 system review of systems performed and notable only for those listed below and in HPI above, all others are negative:  Constitutional:   Cardiovascular:  Ear/Nose/Throat:   Skin:  Eyes:   Respiratory:   Gastroitestinal:   Genitourinary:  Hematology/Lymphatic:   Endocrine:  Musculoskeletal:   Allergy/Immunology:   Neurological:  HA Psychiatric:  Sleep:   The following represents the patient's updated allergies and side effects list: No Known Allergies  The neurologically relevant items on the patient's problem list were reviewed on today's visit.  Neurologic Examination  A problem focused neurological exam (12 or more points of the single system neurologic examination, vital signs counts as 1 point, cranial nerves count for 8 points) was performed.  Blood pressure 154/85, pulse 75, height 5\' 4"  (1.626 m), weight 158 lb 9.6 oz (71.94 kg).  General - Well nourished, well developed, in no apparent distress.  Ophthalmologic - Sharp disc margins OU. Fundi not visualized due to .  Cardiovascular - Regular rate and rhythm with no  murmur.  Mental Status -  Level of arousal and orientation to time, place, and person were intact. Language including expression, naming, repetition, comprehension was assessed and found intact. Attention span and concentration were normal. Recent and remote memory were intact. Fund of Knowledge was assessed and was intact.  Cranial Nerves II - XII - II - Visual field intact OU. III, IV, VI - Extraocular movements intact. V - Facial sensation intact bilaterally. VII - Facial movement intact bilaterally. VIII - Hearing & vestibular intact bilaterally. X - Palate elevates symmetrically. XI - Chin turning & shoulder shrug intact bilaterally. XII - Tongue protrusion intact.  Motor Strength - The patient's strength was normal in all extremities and pronator drift was absent.  Bulk was normal and fasciculations were absent.   Motor Tone - Muscle tone was assessed at the neck and appendages and was normal.  Reflexes - The patient's reflexes were 1+ in all extremities and she had no pathological reflexes.  Sensory - Light touch, temperature/pinprick, vibration and proprioception, and Romberg testing were assessed and were normal.    Coordination - The patient had normal movements in the hands and feet with no ataxia or dysmetria.  Tremor was absent.  Gait and Station - The patient's transfers, posture, gait, station, and turns were observed as normal.  Data reviewed: I personally reviewed the images and agree with the radiology interpretations.  Ct Angio Head and neck W/cm &/or Wo Cm  04/25/2015 IMPRESSION: 1. No emergent large vessel occlusion. Preliminary report of these findings discussed in person with  Dr. Aram Beecham at 1053 hours. 2. No significant stenosis. ICA siphon calcified plaque, with little to no atherosclerosis elsewhere. 3. CT head without contrast reported separately.   Ct Head Wo Contrast  04/25/2015 IMPRESSION: No acute cortically based infarct or acute intracranial  hemorrhage identified. Mild age indeterminate left basal ganglia hypodensity suggestive of small vessel disease.   Mr Brain Wo Contrast  04/25/2015 IMPRESSION: Acute nonhemorrhagic infarct extends from the posterior left lenticular nucleus to posterior left corona radiata. Very mild small vessel disease type changes.   2D Echocardiogram - Left ventricle: The cavity size was normal. Systolic function was normal. The estimated ejection fraction was in the range of 55% to 60%. Wall motion was normal; there were no regional wall motion abnormalities. Doppler parameters are consistent with abnormal left ventricular relaxation (grade 1 diastolic dysfunction). - Mitral valve: Calcified annulus. Impressions: - No cardiac source of emboli was indentified.  Component     Latest Ref Rng 04/26/2015  Cholesterol     0 - 200 mg/dL 217 (H)  Triglycerides     <150 mg/dL 134  HDL Cholesterol     >40 mg/dL 74  Total CHOL/HDL Ratio      2.9  VLDL     0 - 40 mg/dL 27  LDL (calc)     0 - 99 mg/dL 116 (H)  Hemoglobin A1C     4.8 - 5.6 % 5.5  Mean Plasma Glucose      111    Assessment: As you may recall, she is a 64 y.o. African American female with PMH of HTN and HLD was admitted for left BG infarct on MRI. CTA head and neck, 2D echo and A1C unremarkable, LDL 116. She was discharged on ASA and lipitor. During the interval time, she has been doing well. No stroke like symptoms or residue deficit. No symptoms of OSA, denies smoking or drugs.  Plan:  - continue ASA and lipitor for stroke prevention - check BP at home - Follow up with your primary care physician for stroke risk factor modification. Recommend maintain blood pressure goal <130/80, diabetes with hemoglobin A1c goal below 6.5% and lipids with LDL cholesterol goal below 70 mg/dL.  - healthy diet and regular exercise - follow up in 4 months  No orders of the defined types were placed in this encounter.    No orders  of the defined types were placed in this encounter.    Patient Instructions  - continue ASA and lipitor for stroke prevention - check BP at home - Follow up with your primary care physician for stroke risk factor modification. Recommend maintain blood pressure goal <130/80, diabetes with hemoglobin A1c goal below 6.5% and lipids with LDL cholesterol goal below 70 mg/dL.  - healthy diet and regular exercise - follow up in 4 months   Rosalin Hawking, MD PhD [facility] [address] [place], [place] [address] [phone]

## 2015-06-24 LAB — HM MAMMOGRAPHY: HM MAMMO: NEGATIVE

## 2015-06-26 ENCOUNTER — Encounter: Payer: Self-pay | Admitting: *Deleted

## 2015-09-30 ENCOUNTER — Ambulatory Visit (INDEPENDENT_AMBULATORY_CARE_PROVIDER_SITE_OTHER): Payer: BLUE CROSS/BLUE SHIELD | Admitting: Family

## 2015-09-30 ENCOUNTER — Encounter: Payer: Self-pay | Admitting: Family

## 2015-09-30 ENCOUNTER — Ambulatory Visit (INDEPENDENT_AMBULATORY_CARE_PROVIDER_SITE_OTHER): Payer: BLUE CROSS/BLUE SHIELD

## 2015-09-30 VITALS — BP 141/92 | HR 85 | Temp 97.6°F | Ht 64.0 in | Wt 159.6 lb

## 2015-09-30 DIAGNOSIS — G5601 Carpal tunnel syndrome, right upper limb: Secondary | ICD-10-CM | POA: Diagnosis not present

## 2015-09-30 DIAGNOSIS — M25531 Pain in right wrist: Secondary | ICD-10-CM | POA: Diagnosis not present

## 2015-09-30 MED ORDER — PREDNISONE 10 MG (21) PO TBPK: 10.0000 mg | ORAL_TABLET | Freq: Every day | ORAL | Status: DC

## 2015-09-30 MED ORDER — NAPROXEN 500 MG PO TABS: 500.0000 mg | ORAL_TABLET | Freq: Two times a day (BID) | ORAL | Status: DC

## 2015-09-30 NOTE — Patient Instructions (Signed)

## 2015-09-30 NOTE — Progress Notes (Signed)
   Subjective:    Patient ID: Krista Finley, female    DOB: 12-25-1950, 65 y.o.   MRN: [id number]  Hand Pain  The incident occurred more than 1 week ago. There was no injury mechanism. The pain is present in the right wrist. The quality of the pain is described as aching. The pain radiates to the right arm. The pain is at a severity of 8/10. The pain is moderate. The pain has been constant since the incident. Associated symptoms include numbness (right thumb) and tingling (right thumn). Nothing aggravates the symptoms. She has tried acetaminophen and rest for the symptoms. The treatment provided mild relief.      Review of Systems  Constitutional: Negative.   HENT: Negative.   Eyes: Negative.   Respiratory: Negative.  Negative for shortness of breath.   Cardiovascular: Negative.  Negative for palpitations.  Gastrointestinal: Negative.   Endocrine: Negative.   Genitourinary: Negative.   Musculoskeletal: Negative.   Neurological: Positive for tingling (right thumn) and numbness (right thumb). Negative for headaches.  Hematological: Negative.   Psychiatric/Behavioral: Negative.   All other systems reviewed and are negative.      Objective:   Physical Exam  Constitutional: She is oriented to person, place, and time. She appears well-developed and well-nourished. No distress.  HENT:  Head: Normocephalic and atraumatic.  Right Ear: External ear normal.  Mouth/Throat: Oropharynx is clear and moist.  Eyes: Pupils are equal, round, and reactive to light.  Neck: Normal range of motion. Neck supple. No thyromegaly present.  Cardiovascular: Normal rate, regular rhythm, normal heart sounds and intact distal pulses.   No murmur heard. Pulmonary/Chest: Effort normal and breath sounds normal. No respiratory distress. She has no wheezes.  Abdominal: Soft. Bowel sounds are normal. She exhibits no distension. There is no tenderness.  Musculoskeletal: Normal range of motion. She exhibits no  edema or tenderness.  Neurological: She is alert and oriented to person, place, and time. She has normal reflexes. No cranial nerve deficit.  phalen and tinel sign positive   Skin: Skin is warm and dry.  Psychiatric: She has a normal mood and affect. Her behavior is normal. Judgment and thought content normal.  Vitals reviewed.   Wrist x-ray- WNL,Preliminary reading by Evelina Dun, FNP [facility]   BP 141/92 mmHg  Pulse 85  Temp(Src) 97.6 F (36.4 C) (Oral)  Ht 5\' 4"  (1.626 m)  Wt 159 lb 9.6 oz (72.394 kg)  BMI 27.38 kg/m2     Assessment & Plan:  1. Right wrist pain - DG Wrist Complete Right; Future  2. Carpal tunnel syndrome of right wrist -Pt to get wrist brace and wear while sleeping and working -Ice -Rest -RTO prn - predniSONE (STERAPRED UNI-PAK 21 TAB) 10 MG (21) TBPK tablet; Take 1 tablet (10 mg total) by mouth daily. As directed x 6 days  Dispense: 21 tablet; Refill: 0 - naproxen (NAPROSYN) 500 MG tablet; Take 1 tablet (500 mg total) by mouth 2 (two) times daily with a meal.  Dispense: 60 tablet; Refill: 0  Evelina Dun, FNP

## 2015-10-02 ENCOUNTER — Telehealth: Payer: Self-pay | Admitting: Family

## 2015-10-02 NOTE — Telephone Encounter (Signed)
Pt aware.

## 2015-10-02 NOTE — Telephone Encounter (Signed)
The other options would be to take ibuprofen 3 times daily at 600 mg which she can get over-the-counter. She will have to discuss the note with Krista Finley and she will be back tomorrow. If she takes the prednisone in the morning and shin causes many issues with her getting up but she does not have to take it she can take ibuprofen instead

## 2015-10-08 NOTE — Telephone Encounter (Signed)
Pt can have note for work for one day only. Pt to stop prednisone and use Motrin 600mg  TID

## 2015-10-08 NOTE — Telephone Encounter (Signed)
Patient will try ibuprofen 600mg  TID.  She will take Pepcid daily to help with abd discomfort from ibuprofen use.  She will follow up if symptoms worsen or persist.

## 2015-10-14 ENCOUNTER — Ambulatory Visit (INDEPENDENT_AMBULATORY_CARE_PROVIDER_SITE_OTHER): Payer: BLUE CROSS/BLUE SHIELD | Admitting: Family

## 2015-10-14 ENCOUNTER — Encounter: Payer: Self-pay | Admitting: Family

## 2015-10-14 ENCOUNTER — Ambulatory Visit: Payer: BLUE CROSS/BLUE SHIELD | Admitting: Neurology

## 2015-10-14 VITALS — BP 166/89 | HR 82 | Temp 98.8°F | Ht 64.0 in | Wt 157.0 lb

## 2015-10-14 DIAGNOSIS — G5601 Carpal tunnel syndrome, right upper limb: Secondary | ICD-10-CM

## 2015-10-14 MED ORDER — BUPIVACAINE HCL 0.25 % IJ SOLN
0.5000 mL | Freq: Once | INTRAMUSCULAR | Status: AC
  Administered 2015-10-14: 0.5 mL

## 2015-10-14 MED ORDER — METHYLPREDNISOLONE ACETATE 40 MG/ML IJ SUSP
40.0000 mg | Freq: Once | INTRAMUSCULAR | Status: AC
  Administered 2015-10-14: 40 mg via INTRA_ARTICULAR

## 2015-10-14 NOTE — Progress Notes (Signed)
   Subjective:    Patient ID: Krista Finley, female    DOB: [birth year], 65 y.o.   MRN: [id number]   HPI PT presents to the office with right wrist pain. PT was seen in the office on 09/30/15  And was diagnosed with carpal tunnel syndrome. Pt states she was unable to tolerate the prednisone dose pack because it kept her "up all night". PT has taken the naprosyn 500 mg BID and wearing the brace while working, but not at night. Pt states she is unable to sleep with the brace on.    Review of Systems  Constitutional: Negative.   HENT: Negative.   Eyes: Negative.   Respiratory: Negative.  Negative for shortness of breath.   Cardiovascular: Negative.  Negative for palpitations.  Gastrointestinal: Negative.   Endocrine: Negative.   Genitourinary: Negative.   Musculoskeletal: Negative.   Neurological: Negative.  Negative for headaches.  Hematological: Negative.   Psychiatric/Behavioral: Negative.   All other systems reviewed and are negative.      Objective:   Physical Exam  Constitutional: She is oriented to person, place, and time. She appears well-developed and well-nourished. No distress.  HENT:  Head: Normocephalic and atraumatic.  Eyes: Pupils are equal, round, and reactive to light.  Neck: Normal range of motion. Neck supple. No thyromegaly present.  Cardiovascular: Normal rate, regular rhythm, normal heart sounds and intact distal pulses.   No murmur heard. Pulmonary/Chest: Effort normal and breath sounds normal. No respiratory distress. She has no wheezes.  Abdominal: Soft. Bowel sounds are normal. She exhibits no distension. There is no tenderness.  Musculoskeletal: Normal range of motion. She exhibits edema. She exhibits no tenderness.  positive tinel and phalen test  Neurological: She is alert and oriented to person, place, and time. She has normal reflexes. No cranial nerve deficit.  Skin: Skin is warm and dry.  Psychiatric: She has a normal mood and affect. Her  behavior is normal. Judgment and thought content normal.  Vitals reviewed.  rightwrist prepped with betadine Injected with Marcaine .5% plain and methylprednisolone with 25 guage needle. Patient tolerated well.   BP 166/89 mmHg  Pulse 82  Temp(Src) 98.8 F (37.1 C) (Oral)  Ht 5\' 4"  (1.626 m)  Wt 157 lb (71.215 kg)  BMI 26.94 kg/m2       Assessment & Plan:  1. Carpal tunnel syndrome of right wrist -Pt to try to restart prednisone  -Continue naprosyn -Wear brace while working and while sleeping -RTO prn  - Ambulatory referral to [place], [place]

## 2015-10-14 NOTE — Patient Instructions (Signed)

## 2015-10-14 NOTE — Addendum Note (Signed)
Addended by: Evelina Dun A on: 10/14/2015 04:32 PM   Modules accepted: Orders

## 2015-10-28 ENCOUNTER — Ambulatory Visit (INDEPENDENT_AMBULATORY_CARE_PROVIDER_SITE_OTHER): Payer: BLUE CROSS/BLUE SHIELD | Admitting: Family Medicine

## 2015-10-28 ENCOUNTER — Encounter: Payer: Self-pay | Admitting: Family Medicine

## 2015-10-28 VITALS — BP 156/94 | HR 91 | Temp 100.5°F | Ht 64.0 in | Wt 159.0 lb

## 2015-10-28 DIAGNOSIS — J029 Acute pharyngitis, unspecified: Secondary | ICD-10-CM | POA: Diagnosis not present

## 2015-10-28 DIAGNOSIS — R6889 Other general symptoms and signs: Secondary | ICD-10-CM | POA: Diagnosis not present

## 2015-10-28 LAB — VERITOR FLU A/B WAIVED
INFLUENZA B: NEGATIVE
Influenza A: NEGATIVE

## 2015-10-28 LAB — RAPID STREP SCREEN (MED CTR MEBANE ONLY): STREP GP A AG, IA W/REFLEX: NEGATIVE

## 2015-10-28 LAB — CULTURE, GROUP A STREP

## 2015-10-28 MED ORDER — HYDROCODONE-HOMATROPINE 5-1.5 MG/5ML PO SYRP: 5.0000 mL | ORAL_SOLUTION | Freq: Three times a day (TID) | ORAL | Status: DC | PRN

## 2015-10-28 MED ORDER — OSELTAMIVIR PHOSPHATE 75 MG PO CAPS: 75.0000 mg | ORAL_CAPSULE | Freq: Two times a day (BID) | ORAL | Status: DC

## 2015-10-28 NOTE — Progress Notes (Signed)
   Subjective:    Patient ID: Krista Finley, female    DOB: 14-Aug-1951, 65 y.o.   MRN: [id number]  HPI Patient here today for flu like symptoms that started on Friday. Patient has a temp of 100.5 with cough chills headache sore throat and myalgias. She has used some Alka-Seltzer without much relief.     Patient Active Problem List   Diagnosis Date Noted  . Cerebral infarction due to thrombosis of left middle cerebral artery ([place]) 06/11/2015  . Essential hypertension 06/11/2015  . HLD (hyperlipidemia) 06/11/2015  . Dyslipidemia 04/26/2015  . CVA (cerebral infarction) 04/25/2015  . HTN (hypertension) 06/20/2013   Outpatient Encounter Prescriptions as of 10/28/2015  Medication Sig  . aspirin 325 MG tablet Take 1 tablet (325 mg total) by mouth daily.  Marland Kitchen atorvastatin (LIPITOR) 20 MG tablet Take 1 tablet (20 mg total) by mouth daily at 6 PM.  . cyclobenzaprine (FLEXERIL) 10 MG tablet Take 1 tablet (10 mg total) by mouth 3 (three) times daily as needed for muscle spasms.  . metoprolol tartrate (LOPRESSOR) 25 MG tablet Take 1 tablet (25 mg total) by mouth 2 (two) times daily.  . naproxen (NAPROSYN) 500 MG tablet Take 1 tablet (500 mg total) by mouth 2 (two) times daily with a meal.  . traMADol (ULTRAM) 50 MG tablet Take 1 tablet (50 mg total) by mouth every 8 (eight) hours as needed.   No facility-administered encounter medications on file as of 10/28/2015.      Review of Systems  Constitutional: Positive for fever and chills.  HENT: Positive for congestion, ear pain and sore throat.   Eyes: Negative.   Respiratory: Positive for cough.   Cardiovascular: Negative.   Gastrointestinal: Positive for diarrhea.  Endocrine: Negative.   Genitourinary: Negative.   Musculoskeletal: Positive for myalgias.  Skin: Negative.   Allergic/Immunologic: Negative.   Neurological: Positive for headaches.  Hematological: Negative.   Psychiatric/Behavioral: Negative.        Objective:   Physical  Exam  Constitutional: She is oriented to person, place, and time. She appears well-developed and well-nourished.  HENT:  Head: Normocephalic.  Right Ear: External ear normal.  Left Ear: External ear normal.  Cardiovascular: Normal rate and regular rhythm.   Pulmonary/Chest: Effort normal and breath sounds normal.  Neurological: She is alert and oriented to person, place, and time.   BP 156/94 mmHg  Pulse 91  Temp(Src) 100.5 F (38.1 C) (Oral)  Ht 5\' 4"  (1.626 m)  Wt 159 lb (72.122 kg)  BMI 27.28 kg/m2        Assessment & Plan:  1. Flu-like symptoms Both flu a and flu B are negative but clinically she has the flu. I believe she should be treated with Tamiflu 75 mg twice a day and Hycodan as needed for cough - Veritor Flu A/B Waived - Rapid strep screen (not at [facility])  2. Sore throat Rapid strep test was negative - Veritor Flu A/B Waived - Rapid strep screen (not at [facility])  Wardell Honour MD

## 2015-10-30 ENCOUNTER — Telehealth: Payer: Self-pay | Admitting: Family

## 2015-10-30 NOTE — Telephone Encounter (Signed)
Letter placed at front desk, patient was informed

## 2015-11-07 ENCOUNTER — Other Ambulatory Visit: Payer: BLUE CROSS/BLUE SHIELD | Admitting: Family

## 2015-11-08 ENCOUNTER — Encounter: Payer: Self-pay | Admitting: Family

## 2016-05-20 ENCOUNTER — Other Ambulatory Visit: Payer: Self-pay | Admitting: Family

## 2016-05-20 DIAGNOSIS — I10 Essential (primary) hypertension: Secondary | ICD-10-CM

## 2016-05-21 NOTE — Telephone Encounter (Signed)
Patient NTBS for follow up and lab work  

## 2016-09-28 ENCOUNTER — Telehealth: Payer: Self-pay | Admitting: Family

## 2016-09-28 NOTE — Telephone Encounter (Signed)
Denied.

## 2017-03-16 ENCOUNTER — Emergency Department (HOSPITAL_COMMUNITY): Payer: BLUE CROSS/BLUE SHIELD

## 2017-03-16 ENCOUNTER — Emergency Department (HOSPITAL_COMMUNITY)
Admission: EM | Admit: 2017-03-16 | Discharge: 2017-03-16 | Disposition: A | Discharge status: Home / Self Care | Payer: BLUE CROSS/BLUE SHIELD | Attending: Emergency Medicine | Admitting: Emergency Medicine

## 2017-03-16 ENCOUNTER — Encounter (HOSPITAL_COMMUNITY): Payer: Self-pay | Admitting: Emergency Medicine

## 2017-03-16 DIAGNOSIS — R079 Chest pain, unspecified: Secondary | ICD-10-CM

## 2017-03-16 DIAGNOSIS — R51 Headache: Secondary | ICD-10-CM | POA: Diagnosis not present

## 2017-03-16 DIAGNOSIS — R0789 Other chest pain: Secondary | ICD-10-CM | POA: Insufficient documentation

## 2017-03-16 DIAGNOSIS — I10 Essential (primary) hypertension: Secondary | ICD-10-CM | POA: Insufficient documentation

## 2017-03-16 DIAGNOSIS — R519 Headache, unspecified: Secondary | ICD-10-CM

## 2017-03-16 DIAGNOSIS — Z8673 Personal history of transient ischemic attack (TIA), and cerebral infarction without residual deficits: Secondary | ICD-10-CM | POA: Insufficient documentation

## 2017-03-16 DIAGNOSIS — Z7982 Long term (current) use of aspirin: Secondary | ICD-10-CM | POA: Insufficient documentation

## 2017-03-16 LAB — CBC
HEMATOCRIT: 39.9 % (ref 36.0–46.0)
Hemoglobin: 13.1 g/dL (ref 12.0–15.0)
MCH: 30 pg (ref 26.0–34.0)
MCHC: 32.8 g/dL (ref 30.0–36.0)
MCV: 91.5 fL (ref 78.0–100.0)
Platelets: 271 10*3/uL (ref 150–400)
RBC: 4.36 MIL/uL (ref 3.87–5.11)
RDW: 13.5 % (ref 11.5–15.5)
WBC: 6.2 10*3/uL (ref 4.0–10.5)

## 2017-03-16 LAB — BASIC METABOLIC PANEL
ANION GAP: 10 (ref 5–15)
BUN: 8 mg/dL (ref 6–20)
CALCIUM: 9 mg/dL (ref 8.9–10.3)
CO2: 26 mmol/L (ref 22–32)
Chloride: 105 mmol/L (ref 101–111)
Creatinine, Ser: 0.86 mg/dL (ref 0.44–1.00)
GFR calc Af Amer: >60 mL/min (ref >60)
GFR calc non Af Amer: >60 mL/min (ref >60)
GLUCOSE: 121 mg/dL — AB (ref 65–99)
Potassium: 3.8 mmol/L (ref 3.5–5.1)
Sodium: 141 mmol/L (ref 135–145)

## 2017-03-16 LAB — I-STAT TROPONIN, ED
Troponin i, poc: 0.01 ng/mL (ref 0.00–0.08)
Troponin i, poc: 0.04 ng/mL (ref 0.00–0.08)

## 2017-03-16 MED ORDER — MORPHINE SULFATE (PF) 2 MG/ML IV SOLN
2.0000 mg | Freq: Once | INTRAVENOUS | Status: AC
  Administered 2017-03-16: 2 mg via INTRAVENOUS
  Filled 2017-03-16: qty 1

## 2017-03-16 MED ORDER — METOPROLOL TARTRATE 5 MG/5ML IV SOLN
10.0000 mg | Freq: Once | INTRAVENOUS | Status: AC
  Administered 2017-03-16: 10 mg via INTRAVENOUS
  Filled 2017-03-16: qty 10

## 2017-03-16 MED ORDER — KETOROLAC TROMETHAMINE 30 MG/ML IJ SOLN
30.0000 mg | Freq: Once | INTRAMUSCULAR | Status: AC
  Administered 2017-03-16: 30 mg via INTRAVENOUS
  Filled 2017-03-16: qty 1

## 2017-03-16 NOTE — ED Notes (Signed)
Pt to xray at this time.

## 2017-03-16 NOTE — ED Notes (Signed)
Per MD Ray give pt 44ml lopressor.

## 2017-03-16 NOTE — ED Triage Notes (Signed)
Pt states she was getting dental work on upper teeth today since 0800, used local anesthetic. Began having central CP and HA approx 30 minutes ago. Did not take BP medication d/t dental work, per EMS BP was 759F systolic.

## 2017-03-16 NOTE — ED Provider Notes (Signed)
[place] DEPT Provider Note   CSN: [id number] Arrival date & time: 03/16/17  1243     History   Chief Complaint Chief Complaint  Patient presents with  . Chest Pain  . Headache    HPI SKYA MCCULLUM is a 66 y.o. female.  HPI 66 year old female history of CVA, hypertension, hyperlipidemia, history of headaches who presents today from her dentist office. She states she did not take her normal a.m. medications. Her dentist had some difficulty obtaining good local anesthesia and injected lidocaine. She began having some intermittent shaking of her legs and arms that last approximately 5 minutes. More lidocaine was given and she was not numb and she has more shaking. She states that her chest then became tight. She states her blood pressure was taken in her dentist office and initial blood pressure was 50 and it was repeated and her blood pressure was 206/105. She states her hands were tingling. She had some dyspnea. She began having bitemporal headache. She denies having any previous chest pain until this occurred today. Pain came and went last and possibly 5 minutes. She continues to have a headache. She describes as bitemporal and severe. She has not had any medication for this. She normally would take her metoprolol the morning but had not taken any of her morning medications due to the dental procedure. He had a prior cariac workup at [facility] but does not remember what was done-EDV evaluation done in January 2014 Past Medical History:  Diagnosis Date  . CVA (cerebral vascular accident) ([place])   . Gastritis   . Headache   . Hyperlipidemia   . Hypertension     Patient Active Problem List   Diagnosis Date Noted  . Cerebral infarction due to thrombosis of left middle cerebral artery ([place]) 06/11/2015  . Essential hypertension 06/11/2015  . HLD (hyperlipidemia) 06/11/2015  . Dyslipidemia 04/26/2015  . CVA (cerebral infarction) 04/25/2015  . HTN (hypertension) 06/20/2013     Past Surgical History:  Procedure Laterality Date  . ELBOW SURGERY Right     OB History    No data available       Home Medications    Prior to Admission medications   Medication Sig Start Date End Date Taking? Authorizing Provider  aspirin 325 MG tablet Take 1 tablet (325 mg total) by mouth daily. 04/27/15   Verlee Monte, MD  atorvastatin (LIPITOR) 20 MG tablet Take 1 tablet (20 mg total) by mouth daily at 6 PM. 05/31/15   Sharion Balloon, FNP  cyclobenzaprine (FLEXERIL) 10 MG tablet Take 1 tablet (10 mg total) by mouth 3 (three) times daily as needed for muscle spasms. 04/16/15   Chipper Herb, MD  HYDROcodone-homatropine [facility]) 5-1.5 MG/5ML syrup Take 5 mLs by mouth every 8 (eight) hours as needed for cough. 10/28/15   Wardell Honour, MD  metoprolol tartrate (LOPRESSOR) 25 MG tablet TAKE ONE TABLET BY MOUTH TWICE DAILY 05/21/16   Evelina Dun A, FNP  naproxen (NAPROSYN) 500 MG tablet Take 1 tablet (500 mg total) by mouth 2 (two) times daily with a meal. 09/30/15   Evelina Dun A, FNP  oseltamivir (TAMIFLU) 75 MG capsule Take 1 capsule (75 mg total) by mouth 2 (two) times daily. 10/28/15   Wardell Honour, MD  traMADol (ULTRAM) 50 MG tablet Take 1 tablet (50 mg total) by mouth every 8 (eight) hours as needed. 04/16/15   Chipper Herb, MD    Family History Family History  Problem Relation Age  of Onset  . Hypertension Mother   . Heart disease Mother   . Heart disease Father        MI  . Stomach cancer Maternal Aunt   . Colon cancer Neg Hx     Social History Social History  Substance Use Topics  . Smoking status: Never Smoker  . Smokeless tobacco: Never Used  . Alcohol use 0.6 oz/week    1 Glasses of wine per week     Comment: on holidays and events     Allergies   Patient has no known allergies.   Review of Systems Review of Systems  All other systems reviewed and are negative.    Physical Exam Updated Vital Signs BP (!) 159/90   Pulse 92    Temp 99.2 F (37.3 C) (Oral)   Resp 15   Wt 72.6 kg (160 lb)   SpO2 99%   BMI 27.46 kg/m   Physical Exam  Constitutional: She is oriented to person, place, and time. She appears well-developed and well-nourished. No distress.  HENT:  Head: Normocephalic and atraumatic.  Right Ear: External ear normal.  Left Ear: External ear normal.  Nose: Nose normal.  Eyes: Pupils are equal, round, and reactive to light. Conjunctivae and EOM are normal.  Neck: Normal range of motion. Neck supple.  Cardiovascular: Normal rate, regular rhythm and normal heart sounds.   Pulmonary/Chest: Effort normal and breath sounds normal.  Abdominal: Soft. Bowel sounds are normal.  Musculoskeletal: Normal range of motion.  Neurological: She is alert and oriented to person, place, and time. She displays normal reflexes. No cranial nerve deficit. She exhibits normal muscle tone. Coordination normal.  Skin: Skin is warm and dry.  Psychiatric: She has a normal mood and affect. Her behavior is normal. Thought content normal.  Nursing note and vitals reviewed.    ED Treatments / Results  Labs (all labs ordered are listed, but only abnormal results are displayed) Labs Reviewed  CBC  BASIC METABOLIC PANEL  I-STAT TROPONIN, ED    EKG  EKG Interpretation  Date/Time:  Tuesday March 16 2017 12:58:08 EDT Ventricular Rate:  94 PR Interval:  166 QRS Duration: 84 QT Interval:  378 QTC Calculation: 472 R Axis:   57 Text Interpretation:  Normal sinus rhythm Biatrial enlargement Abnormal ECG Confirmed by Pattricia Boss [phone]) on 03/16/2017 1:45:04 PM       Radiology No results found.  Procedures Procedures (including critical care time)  Medications Ordered in ED Medications  metoprolol tartrate (LOPRESSOR) injection 10 mg (10 mg Intravenous Given 03/16/17 1350)  morphine 2 MG/ML injection 2 mg (2 mg Intravenous Given 03/16/17 1350)     Initial Impression / Assessment and Plan / ED Course  I have  reviewed the triage vital signs and the nursing notes.  Pertinent labs & imaging results that were available during my care of the patient were reviewed by me and considered in my medical decision making (see chart for details).     1- chest pain- atypical- doubt acs, mi, aortic dissection, pe- patient without acute ischemic change on ekg, troponin and delta troponin wnl. Pain spontaneously resolved after removing from procedure at dentist's office without intervention 2- headache- patient with ho same.  Ct without acute abnormality and responding to nsaid.   Final Clinical Impressions(s) / ED Diagnoses   Final diagnoses:  Nonintractable headache, unspecified chronicity pattern, unspecified headache type  Nonspecific chest pain    New Prescriptions New Prescriptions   No medications on file  Pattricia Boss, MD 03/16/17 [phone]

## 2017-03-16 NOTE — Discharge Instructions (Signed)
Please resume your prescriptions as usual. Recheck with your doctor this week. Return if you are worse at any time.

## 2017-03-16 NOTE — ED Notes (Signed)
Attempted to get green tube for ISTAT unsuccessfully.

## 2017-06-02 ENCOUNTER — Other Ambulatory Visit: Payer: Self-pay | Admitting: Family

## 2017-06-02 DIAGNOSIS — I10 Essential (primary) hypertension: Secondary | ICD-10-CM

## 2017-06-08 ENCOUNTER — Other Ambulatory Visit: Payer: Self-pay | Admitting: Family

## 2017-06-08 DIAGNOSIS — I10 Essential (primary) hypertension: Secondary | ICD-10-CM

## 2017-08-27 ENCOUNTER — Ambulatory Visit: Payer: BLUE CROSS/BLUE SHIELD | Admitting: Family Medicine

## 2017-08-27 VITALS — BP 159/84 | HR 67 | Temp 98.1°F | Ht 64.0 in | Wt 160.0 lb

## 2017-08-27 DIAGNOSIS — M7662 Achilles tendinitis, left leg: Secondary | ICD-10-CM | POA: Diagnosis not present

## 2017-08-27 DIAGNOSIS — I10 Essential (primary) hypertension: Secondary | ICD-10-CM

## 2017-08-27 MED ORDER — METOPROLOL TARTRATE 25 MG PO TABS
25.0000 mg | ORAL_TABLET | Freq: Two times a day (BID) | ORAL | 0 refills | Status: DC
Start: 2017-08-27 — End: 2017-09-09

## 2017-08-27 MED ORDER — NAPROXEN 500 MG PO TABS: 500.0000 mg | ORAL_TABLET | Freq: Two times a day (BID) | ORAL | 0 refills | Status: AC

## 2017-08-27 NOTE — Patient Instructions (Signed)
Achilles Tendinitis  Achilles tendinitis is inflammation of the tough, cord-like band that attaches the lower leg muscles to the heel bone (Achilles tendon). This is usually caused by overusing the tendon and the ankle joint.  Achilles tendinitis usually gets better over time with treatment and caring for yourself at home. It can take weeks or months to heal completely.  What are the causes?  This condition may be caused by:  · A sudden increase in exercise or activity, such as running.  · Doing the same exercises or activities (such as jumping) over and over.  · Not warming up calf muscles before exercising.  · Exercising in shoes that are worn out or not made for exercise.  · Having arthritis or a bone growth (spur) on the back of the heel bone. This can rub against the tendon and hurt it.  · Age-related wear and tear. Tendons become less flexible with age and more likely to be injured.    What are the signs or symptoms?  Common symptoms of this condition include:  · Pain in the Achilles tendon or in the back of the leg, just above the heel. The pain usually gets worse with exercise.  · Stiffness or soreness in the back of the leg, especially in the morning.  · Swelling of the skin over the Achilles tendon.  · Thickening of the tendon.  · Bone spurs at the bottom of the Achilles tendon, near the heel.  · Trouble standing on tiptoe.    How is this diagnosed?  This condition is diagnosed based on your symptoms and a physical exam. You may have tests, including:  · X-rays.  · MRI.    How is this treated?  The goal of treatment is to relieve symptoms and help your injury heal. Treatment may include:  · Decreasing or stopping activities that caused the tendinitis. This may mean switching to low-impact exercises like biking or swimming.  · Icing the injured area.  · Doing physical therapy, including strengthening and stretching exercises.  · NSAIDs to help relieve pain and swelling.  · Using supportive shoes, wraps,  heel lifts, or a walking boot (air cast).  · Surgery. This may be done if your symptoms do not improve after 6 months.  · Using high-energy shock wave impulses to stimulate the healing process (extracorporeal shock wave therapy). This is rare.  · Injection of medicines to help relieve inflammation (corticosteroids). This is rare.    Follow these instructions at home:  If you have an air cast:   · Wear the cast as told by your health care provider. Remove it only as told by your health care provider.  · Loosen the cast if your toes tingle, become numb, or turn cold and blue.  Activity   · Gradually return to your normal activities once your health care provider approves. Do not do activities that cause pain.  ? Consider doing low-impact exercises, like cycling or swimming.  · If you have an air cast, ask your health care provider when it is safe for you to drive.  · If physical therapy was prescribed, do exercises as told by your health care provider or physical therapist.  Managing pain, stiffness, and swelling   · Raise (elevate) your foot above the level of your heart while you are sitting or lying down.  · Move your toes often to avoid stiffness and to lessen swelling.  · If directed, put ice on the injured area:  ?   Put ice in a plastic bag.  ? Place a towel between your skin and the bag.  ? Leave the ice on for 20 minutes, 2-3 times a day  General instructions   · If directed, wrap your foot with an elastic bandage or other wrap. This can help keep your tendon from moving too much while it heals. Your health care provider will show you how to wrap your foot correctly.  · Wear supportive shoes or heel lifts only as told by your health care provider.  · Take over-the-counter and prescription medicines only as told by your health care provider.  · Keep all follow-up visits as told by your health care provider. This is important.  Contact a health care provider if:  · You have symptoms that gets worse.  · You have  pain that does not get better with medicine.  · You develop new, unexplained symptoms.  · You develop warmth and swelling in your foot.  · You have a fever.  Get help right away if:  · You have a sudden popping sound or sensation in your Achilles tendon followed by severe pain.  · You cannot move your toes or foot.  · You cannot put any weight on your foot.  Summary  · Achilles tendinitis is inflammation of the tough, cord-like band that attaches the lower leg muscles to the heel bone (Achilles tendon).  · This condition is usually caused by overusing the tendon and the ankle joint. It can also be caused by arthritis or normal aging.  · The most common symptoms of this condition include pain, swelling, or stiffness in the Achilles tendon or in the back of the leg.  · This condition is usually treated with rest, NSAIDs, and physical therapy.  This information is not intended to replace advice given to you by your health care provider. Make sure you discuss any questions you have with your health care provider.  Document Released: 05/20/2005 Document Revised: 06/29/2016 Document Reviewed: 06/29/2016  Elsevier Interactive Patient Education © 2017 [employer]

## 2017-08-27 NOTE — Progress Notes (Signed)
Subjective: CC: heel pain PCP: Sharion Balloon, FNP [id number] Krista Finley is a 67 y.o. female presenting to clinic today for:  1. Heel pain Patient reports acute onset of left posterior heel pain that started last evening.  She denies preceding injury.  She notes that she took naproxen last evening which did help improve symptoms.  However, she was told by a nurse that she should not take this medication as this elevates her blood pressure.  For this reason, she used Tylenol this morning which did not help with symptoms.  She denies recent antibiotic use, including use of fluoroquinolones.  Denies swelling, redness.  She notes pain with walking.  No numbness or tingling.  2. Hypertension Patient notes that she has been taking metoprolol but only once daily.  She did not realize this was a twice daily medication.  She has not taken her dose of medication today.  She usually takes it at nighttime. Denies headache, dizziness, visual changes, nausea, vomiting, chest pain, LE swelling, abdominal pain or shortness of breath.    ROS: Per HPI  No Known Allergies Past Medical History:  Diagnosis Date  . CVA (cerebral vascular accident) ([place])   . Gastritis   . Headache   . Hyperlipidemia   . Hypertension     Current Outpatient Medications:  .  atorvastatin (LIPITOR) 20 MG tablet, Take 1 tablet (20 mg total) by mouth daily at 6 PM., Disp: 30 tablet, Rfl: 4 .  metoprolol tartrate (LOPRESSOR) 25 MG tablet, Take 1 tablet (25 mg total) by mouth 2 (two) times daily., Disp: 60 tablet, Rfl: 0 .  naproxen (NAPROSYN) 500 MG tablet, Take 1 tablet (500 mg total) by mouth 2 (two) times daily with a meal for 7 days. As needed for ankle pain, Disp: 14 tablet, Rfl: 0 Social History   Socioeconomic History  . Marital status: Single    Spouse name: Not on file  . Number of children: Not on file  . Years of education: Not on file  . Highest education level: Not on file  Social Needs  . Financial  resource strain: Not on file  . Food insecurity - worry: Not on file  . Food insecurity - inability: Not on file  . Transportation needs - medical: Not on file  . Transportation needs - non-medical: Not on file  Occupational History  . Not on file  Tobacco Use  . Smoking status: Never Smoker  . Smokeless tobacco: Never Used  Substance and Sexual Activity  . Alcohol use: Yes    Alcohol/week: 0.6 oz    Types: 1 Glasses of wine per week    Comment: on holidays and events  . Drug use: No  . Sexual activity: No  Other Topics Concern  . Not on file  Social History Narrative  . Not on file   Family History  Problem Relation Age of Onset  . Hypertension Mother   . Heart disease Mother   . Heart disease Father        MI  . Stomach cancer Maternal Aunt   . Colon cancer Neg Hx     Objective: Office vital signs reviewed. BP (!) 168/80   Pulse 67   Temp 98.1 F (36.7 C) (Oral)   Ht 5\' 4"  (1.626 m)   Wt 160 lb (72.6 kg)   BMI 27.46 kg/m   Physical Examination:  General: Awake, alert, well nourished, No acute distress HEENT: Moist mucous membranes, sclera white Cardio: regular rate and  rhythm, S1S2 heard, no murmurs appreciated Pulm: clear to auscultation bilaterally, no wheezes, rhonchi or rales; normal work of breathing on room air MSK: Patient has full active range of motion of left ankle.  There is no tenderness to palpation to the ankle joint.  No ligamentous laxity.  She does have tenderness to palpation midway up the left Achilles tendon.  There is what appears to be a palpable knot within this area as well. Neuro: Light touch sensation grossly intact. Skin: No ecchymosis, erythema.  Assessment/ Plan: 67 y.o. female   1. Achilles tendinitis of left lower extremity Clinically consistent with an Achilles tendinitis.  Because the naproxen has helped, I did recommend that she use this for the next week to reduce inflammation.  I did discuss with her that this can raise her  blood pressure but her blood pressure is more likely to be elevated because she is in pain and because she has not been taking her blood pressure medication appropriately.  I recommended that she follow-up in 1 week for recheck.  If persistent pain, could consider using topical nitro patch applied to the area to improve healing.  Home care instructions were reviewed with the patient.  Handout provided. Strict return precautions and reasons for emergent evaluation in the emergency department review with patient.  They voiced understanding and will follow-up as needed. - naproxen (NAPROSYN) 500 MG tablet; Take 1 tablet (500 mg total) by mouth 2 (two) times daily with a meal for 7 days. As needed for ankle pain  Dispense: 14 tablet; Refill: 0  2. Essential hypertension Blood pressure not at goal.  She is asymptomatic.  I suspect because she has not been taking medication appropriately.  Reviewed proper dosing of the metoprolol to twice daily.  I recommended that she take her medication when she gets home.  Refill provided.  Further refills per PCP. - metoprolol tartrate (LOPRESSOR) 25 MG tablet; Take 1 tablet (25 mg total) by mouth 2 (two) times daily.  Dispense: 60 tablet; Refill: 0   Meds ordered this encounter  Medications  . naproxen (NAPROSYN) 500 MG tablet    Sig: Take 1 tablet (500 mg total) by mouth 2 (two) times daily with a meal for 7 days. As needed for ankle pain    Dispense:  14 tablet    Refill:  0  . metoprolol tartrate (LOPRESSOR) 25 MG tablet    Sig: Take 1 tablet (25 mg total) by mouth 2 (two) times daily.    Dispense:  60 tablet    Refill:  0    Please consider 90 day supplies to promote better adherence     Janora Norlander, [place] [phone]

## 2017-09-09 ENCOUNTER — Ambulatory Visit: Payer: BLUE CROSS/BLUE SHIELD | Admitting: Family

## 2017-09-09 ENCOUNTER — Encounter: Payer: Self-pay | Admitting: Family

## 2017-09-09 VITALS — BP 136/68 | HR 61 | Temp 98.4°F | Ht 64.0 in | Wt 153.0 lb

## 2017-09-09 DIAGNOSIS — Z1159 Encounter for screening for other viral diseases: Secondary | ICD-10-CM

## 2017-09-09 DIAGNOSIS — Z23 Encounter for immunization: Secondary | ICD-10-CM | POA: Diagnosis not present

## 2017-09-09 DIAGNOSIS — I10 Essential (primary) hypertension: Secondary | ICD-10-CM

## 2017-09-09 DIAGNOSIS — Z8673 Personal history of transient ischemic attack (TIA), and cerebral infarction without residual deficits: Secondary | ICD-10-CM

## 2017-09-09 DIAGNOSIS — Z0000 Encounter for general adult medical examination without abnormal findings: Secondary | ICD-10-CM | POA: Diagnosis not present

## 2017-09-09 DIAGNOSIS — E785 Hyperlipidemia, unspecified: Secondary | ICD-10-CM

## 2017-09-09 MED ORDER — METOPROLOL TARTRATE 25 MG PO TABS: 25.0000 mg | ORAL_TABLET | Freq: Two times a day (BID) | ORAL | 3 refills | Status: DC

## 2017-09-09 MED ORDER — ATORVASTATIN CALCIUM 20 MG PO TABS: 20.0000 mg | ORAL_TABLET | Freq: Every day | ORAL | 3 refills | Status: DC

## 2017-09-09 NOTE — Progress Notes (Signed)
   Subjective:    Patient ID: Krista Finley, female    DOB: 03/11/1951, 67 y.o.   MRN: [id number]  Pt presents to the office today for CPE without pap and recheck HTN. Pt's BP is at goal today.  Hypertension  This is a chronic problem. The current episode started more than 1 year ago. The problem has been resolved since onset. The problem is controlled. Pertinent negatives include no headaches, malaise/fatigue, peripheral edema or shortness of breath. Risk factors for coronary artery disease include dyslipidemia and post-menopausal state. The current treatment provides moderate improvement. Hypertensive end-organ damage includes CVA. There is no history of CAD/MI or heart failure.  Hyperlipidemia  This is a chronic problem. The current episode started more than 1 year ago. The problem is uncontrolled. Recent lipid tests were reviewed and are high. Pertinent negatives include no shortness of breath. Current antihyperlipidemic treatment includes statins. The current treatment provides moderate improvement of lipids. Risk factors for coronary artery disease include dyslipidemia, hypertension and a sedentary lifestyle.      Review of Systems  Constitutional: Negative for malaise/fatigue.  Respiratory: Negative for shortness of breath.   Neurological: Negative for headaches.  All other systems reviewed and are negative.      Objective:   Physical Exam  Constitutional: She is oriented to person, place, and time. She appears well-developed and well-nourished. No distress.  HENT:  Head: Normocephalic and atraumatic.  Right Ear: External ear normal.  Left Ear: External ear normal.  Nose: Nose normal.  Mouth/Throat: Oropharynx is clear and moist.  Eyes: Pupils are equal, round, and reactive to light.  Neck: Normal range of motion. Neck supple. No thyromegaly present.  Cardiovascular: Normal rate, regular rhythm, normal heart sounds and intact distal pulses.  No murmur  heard. Pulmonary/Chest: Effort normal and breath sounds normal. No respiratory distress. She has no wheezes.  Abdominal: Soft. Bowel sounds are normal. She exhibits no distension. There is no tenderness.  Musculoskeletal: Normal range of motion. She exhibits no edema or tenderness.  Neurological: She is alert and oriented to person, place, and time.  Skin: Skin is warm and dry.  Psychiatric: She has a normal mood and affect. Her behavior is normal. Judgment and thought content normal.  Vitals reviewed.     BP 136/68   Pulse 61   Temp 98.4 F (36.9 C) (Oral)   Ht '5\' 4"'$  (1.626 m)   Wt 153 lb (69.4 kg)   BMI 26.26 kg/m      Assessment & Plan:  1. Annual physical exam - Anemia Profile B - CMP14+EGFR - Lipid panel - Hepatitis C antibody - HIV antibody - TSH - VITAMIN D 25 Hydroxy (Vit-D Deficiency, Fractures)  2. Essential hypertension - CMP14+EGFR  3. Need for hepatitis C screening test - CMP14+EGFR - Hepatitis C antibody  4. Hyperlipidemia, unspecified hyperlipidemia type - CMP14+EGFR - Lipid panel  5. History of cerebral infarction - CMP14+EGFR   Continue all meds Labs pending Health Maintenance reviewed Diet and exercise encouraged RTO 6 months   Evelina Dun, FNP

## 2017-09-09 NOTE — Addendum Note (Signed)
Addended by: Evelina Dun A on: 09/09/2017 12:01 PM   Modules accepted: Orders

## 2017-09-09 NOTE — Addendum Note (Signed)
Addended by: Shelbie Ammons on: 09/09/2017 12:06 PM   Modules accepted: Orders

## 2017-09-09 NOTE — Patient Instructions (Signed)

## 2017-09-10 LAB — CMP14+EGFR
A/G RATIO: 1.5 (ref 1.2–2.2)
ALBUMIN: 4.1 g/dL (ref 3.6–4.8)
ALK PHOS: 115 IU/L (ref 39–117)
ALT: 14 IU/L (ref 0–32)
AST: 15 IU/L (ref 0–40)
BILIRUBIN TOTAL: 0.4 mg/dL (ref 0.0–1.2)
BUN / CREAT RATIO: 13 (ref 12–28)
BUN: 11 mg/dL (ref 8–27)
CO2: 22 mmol/L (ref 20–29)
Calcium: 9.2 mg/dL (ref 8.7–10.3)
Chloride: 107 mmol/L — ABNORMAL HIGH (ref 96–106)
Creatinine, Ser: 0.88 mg/dL (ref 0.57–1.00)
GFR calc Af Amer: 80 mL/min/{1.73_m2} (ref >59)
GFR calc non Af Amer: 70 mL/min/{1.73_m2} (ref >59)
GLOBULIN, TOTAL: 2.7 g/dL (ref 1.5–4.5)
Glucose: 77 mg/dL (ref 65–99)
POTASSIUM: 4.4 mmol/L (ref 3.5–5.2)
SODIUM: 148 mmol/L — AB (ref 134–144)
Total Protein: 6.8 g/dL (ref 6.0–8.5)

## 2017-09-10 LAB — ANEMIA PROFILE B
Basophils Absolute: 0 10*3/uL (ref 0.0–0.2)
Basos: 0 %
EOS (ABSOLUTE): 0.1 10*3/uL (ref 0.0–0.4)
EOS: 2 %
FERRITIN: 50 ng/mL (ref 15–150)
FOLATE: 19.8 ng/mL (ref >3.0)
HEMOGLOBIN: 12.9 g/dL (ref 11.1–15.9)
Hematocrit: 38.4 % (ref 34.0–46.6)
IRON SATURATION: 38 % (ref 15–55)
IRON: 120 ug/dL (ref 27–139)
Immature Grans (Abs): 0 10*3/uL (ref 0.0–0.1)
Immature Granulocytes: 0 %
LYMPHS ABS: 1.7 10*3/uL (ref 0.7–3.1)
Lymphs: 42 %
MCH: 29.8 pg (ref 26.6–33.0)
MCHC: 33.6 g/dL (ref 31.5–35.7)
MCV: 89 fL (ref 79–97)
MONOS ABS: 0.3 10*3/uL (ref 0.1–0.9)
Monocytes: 8 %
NEUTROS ABS: 2 10*3/uL (ref 1.4–7.0)
NEUTROS PCT: 48 %
Platelets: 253 10*3/uL (ref 150–379)
RBC: 4.33 x10E6/uL (ref 3.77–5.28)
RDW: 14 % (ref 12.3–15.4)
Retic Ct Pct: 1 % (ref 0.6–2.6)
Total Iron Binding Capacity: 320 ug/dL (ref 250–450)
UIBC: 200 ug/dL (ref 118–369)
VITAMIN B 12: 513 pg/mL (ref 232–1245)
WBC: 4.1 10*3/uL (ref 3.4–10.8)

## 2017-09-10 LAB — HEPATITIS C ANTIBODY: Hep C Virus Ab: <0.1 s/co ratio (ref 0.0–0.9)

## 2017-09-10 LAB — VITAMIN D 25 HYDROXY (VIT D DEFICIENCY, FRACTURES): VIT D 25 HYDROXY: 17.6 ng/mL — AB (ref 30.0–100.0)

## 2017-09-10 LAB — TSH: TSH: 0.807 u[IU]/mL (ref 0.450–4.500)

## 2017-09-10 LAB — LIPID PANEL
CHOLESTEROL TOTAL: 190 mg/dL (ref 100–199)
Chol/HDL Ratio: 2.6 ratio (ref 0.0–4.4)
HDL: 73 mg/dL (ref >39)
LDL CALC: 102 mg/dL — AB (ref 0–99)
TRIGLYCERIDES: 75 mg/dL (ref 0–149)
VLDL Cholesterol Cal: 15 mg/dL (ref 5–40)

## 2017-09-10 LAB — HIV ANTIBODY (ROUTINE TESTING W REFLEX): HIV SCREEN 4TH GENERATION: NONREACTIVE

## 2017-09-13 ENCOUNTER — Other Ambulatory Visit: Payer: Self-pay | Admitting: Family

## 2017-09-13 ENCOUNTER — Telehealth: Payer: Self-pay | Admitting: Family

## 2017-09-13 DIAGNOSIS — E559 Vitamin D deficiency, unspecified: Secondary | ICD-10-CM

## 2017-09-13 MED ORDER — VITAMIN D (ERGOCALCIFEROL) 1.25 MG (50000 UNIT) PO CAPS: 50000.0000 [IU] | ORAL_CAPSULE | ORAL | 3 refills | Status: DC

## 2017-09-13 NOTE — Telephone Encounter (Signed)
Aware of type of medicine.

## 2017-09-13 NOTE — Telephone Encounter (Signed)
Pt could try an antifungal cream to see if this helps

## 2017-09-27 ENCOUNTER — Telehealth: Payer: Self-pay | Admitting: Family

## 2017-09-27 NOTE — Telephone Encounter (Signed)
Family member aware.

## 2017-09-27 NOTE — Telephone Encounter (Signed)
The only thing that I could say is she could use any athlete's foot powder or spray instead and see if that does better.  If not I would have to see the rash to determine what else it could be.

## 2017-09-27 NOTE — Telephone Encounter (Signed)
Any medication suggestions for rash on ears?  Was seen by C. Hawks and told to try an antifungal cream purchase over the counter.

## 2017-10-06 DIAGNOSIS — Z0289 Encounter for other administrative examinations: Secondary | ICD-10-CM

## 2017-10-21 ENCOUNTER — Encounter: Payer: Self-pay | Admitting: Family Medicine

## 2017-10-21 ENCOUNTER — Ambulatory Visit: Payer: BLUE CROSS/BLUE SHIELD | Admitting: Family Medicine

## 2017-10-21 VITALS — BP 163/83 | HR 82 | Temp 98.5°F | Ht 64.0 in | Wt 149.4 lb

## 2017-10-21 DIAGNOSIS — R6889 Other general symptoms and signs: Secondary | ICD-10-CM | POA: Diagnosis not present

## 2017-10-21 MED ORDER — ONDANSETRON HCL 4 MG PO TABS: 4.0000 mg | ORAL_TABLET | Freq: Three times a day (TID) | ORAL | 0 refills | Status: DC | PRN

## 2017-10-21 NOTE — Patient Instructions (Signed)
Great to meet you!   Influenza, Adult Influenza, more commonly known as "the flu," is a viral infection that primarily affects the respiratory tract. The respiratory tract includes organs that help you breathe, such as the lungs, nose, and throat. The flu causes many common cold symptoms, as well as a high fever and body aches. The flu spreads easily from person to person (is contagious). Getting a flu shot (influenza vaccination) every year is the best way to prevent influenza. What are the causes? Influenza is caused by a virus. You can catch the virus by:  Breathing in droplets from an infected person's cough or sneeze.  Touching something that was recently contaminated with the virus and then touching your mouth, nose, or eyes.  What increases the risk? The following factors may make you more likely to get the flu:  Not cleaning your hands frequently with soap and water or alcohol-based hand sanitizer.  Having close contact with many people during cold and flu season.  Touching your mouth, eyes, or nose without washing or sanitizing your hands first.  Not drinking enough fluids or not eating a healthy diet.  Not getting enough sleep or exercise.  Being under a high amount of stress.  Not getting a yearly (annual) flu shot.  You may be at a higher risk of complications from the flu, such as a severe lung infection (pneumonia), if you:  Are over the age of 46.  Are pregnant.  Have a weakened disease-fighting system (immune system). You may have a weakened immune system if you: ? Have HIV or AIDS. ? Are undergoing chemotherapy. ? Aretaking medicines that reduce the activity of (suppress) the immune system.  Have a long-term (chronic) illness, such as heart disease, kidney disease, diabetes, or lung disease.  Have a liver disorder.  Are obese.  Have anemia.  What are the signs or symptoms? Symptoms of this condition typically last 4-10 days and may  include:  Fever.  Chills.  Headache, body aches, or muscle aches.  Sore throat.  Cough.  Runny or congested nose.  Chest discomfort and cough.  Poor appetite.  Weakness or tiredness (fatigue).  Dizziness.  Nausea or vomiting.  How is this diagnosed? This condition may be diagnosed based on your medical history and a physical exam. Your health care provider may do a nose or throat swab test to confirm the diagnosis. How is this treated? If influenza is detected early, you can be treated with antiviral medicine that can reduce the length of your illness and the severity of your symptoms. This medicine may be given by mouth (orally) or through an IV tube that is inserted in one of your veins. The goal of treatment is to relieve symptoms by taking care of yourself at home. This may include taking over-the-counter medicines, drinking plenty of fluids, and adding humidity to the air in your home. In some cases, influenza goes away on its own. Severe influenza or complications from influenza may be treated in a hospital. Follow these instructions at home:  Take over-the-counter and prescription medicines only as told by your health care provider.  Use a cool mist humidifier to add humidity to the air in your home. This can make breathing easier.  Rest as needed.  Drink enough fluid to keep your urine clear or pale yellow.  Cover your mouth and nose when you cough or sneeze.  Wash your hands with soap and water often, especially after you cough or sneeze. If soap and water  hand sanitizer.  Stay home from work or school as told by your health care provider. Unless you are visiting your health care provider, try to avoid leaving home until your fever has been gone for 24 hours without the use of medicine.  Keep all follow-up visits as told by your health care provider. This is important. How is this prevented?  Getting an annual flu shot is the best way to  avoid getting the flu. You may get the flu shot in late summer, fall, or winter. Ask your health care provider when you should get your flu shot.  Wash your hands often or use hand sanitizer often.  Avoid contact with people who are sick during cold and flu season.  Eat a healthy diet, drink plenty of fluids, get enough sleep, and exercise regularly. Contact a health care provider if:  You develop new symptoms.  You have:  Chest pain.  Diarrhea.  A fever.  Your cough gets worse.  You produce more mucus.  You feel nauseous or you vomit. Get help right away if:  You develop shortness of breath or difficulty breathing.  Your skin or nails turn a bluish color.  You have severe pain or stiffness in your neck.  You develop a sudden headache or sudden pain in your face or ear.  You cannot stop vomiting. This information is not intended to replace advice given to you by your health care provider. Make sure you discuss any questions you have with your health care provider. Document Released: 08/07/2000 Document Revised: 01/16/2016 Document Reviewed: 06/04/2015 Elsevier Interactive Patient Education  2017 [employer]  

## 2017-10-21 NOTE — Progress Notes (Signed)
   HPI  Patient presents today here with flulike illness.  Patient explains that at work earlier this week she was tested and had a negative flu test.  She was examined by the nurse practitioner and told she had a left ear infection and started on amoxicillin. Her ear feels better, however she continues to have cough, body aches, and nasal congestion.  She denies shortness of breath. She does have nausea and states that it had makes it hard to eat or drink.  She does tolerate small amounts of fluids still.  PMH: Smoking status noted ROS: Per HPI  Objective: BP (!) 163/83   Pulse 82   Temp 98.5 F (36.9 C) (Oral)   Ht 5\' 4"  (1.626 m)   Wt 149 lb 6.4 oz (67.8 kg)   BMI 25.64 kg/m  Gen: NAD, alert, cooperative with exam HEENT: NCAT, oropharynx moist and clear, left TM with white purulent material but no erythema, right TM within normal limits CV: RRR, good S1/S2, no murmur Resp: CTABL, no wheezes, non-labored Ext: No edema, warm Neuro: Alert and oriented, No gross deficits  Assessment and plan:  #Flulike symptoms Patient improving appropriately from otitis media I will think that she likely had influenza, written her a note for work today and discussed supportive care Return to clinic with any concerns  Meds ordered this encounter  Medications  . ondansetron (ZOFRAN) 4 MG tablet    Sig: Take 1 tablet (4 mg total) by mouth every 8 (eight) hours as needed for nausea or vomiting.    Dispense:  20 tablet    Refill:  0    Laroy Apple, MD [facility] 10/21/2017, 6:27 PM

## 2017-10-23 ENCOUNTER — Encounter: Payer: Self-pay | Admitting: Family Medicine

## 2017-10-23 ENCOUNTER — Ambulatory Visit: Payer: BLUE CROSS/BLUE SHIELD | Admitting: Family Medicine

## 2017-10-23 ENCOUNTER — Ambulatory Visit: Payer: BLUE CROSS/BLUE SHIELD

## 2017-10-23 VITALS — BP 139/85 | HR 79 | Temp 98.2°F | Ht 64.0 in | Wt 151.0 lb

## 2017-10-23 DIAGNOSIS — J40 Bronchitis, not specified as acute or chronic: Secondary | ICD-10-CM | POA: Diagnosis not present

## 2017-10-23 MED ORDER — BENZONATATE 200 MG PO CAPS: 200.0000 mg | ORAL_CAPSULE | Freq: Two times a day (BID) | ORAL | 0 refills | Status: DC | PRN

## 2017-10-23 MED ORDER — AZITHROMYCIN 250 MG PO TABS: ORAL_TABLET | ORAL | 0 refills | Status: DC

## 2017-10-23 NOTE — Progress Notes (Signed)
Subjective: CC: cough, ill PCP: Sharion Balloon, FNP BJY:[id number] Krista Finley is a 67 y.o. female presenting to clinic today for:  1. Cough Patient reports that she was seen on Monday by the work nurse who thought she had an ear infection.  She notes that she tested negative for flu at that time.  She was placed on amoxicillin and Tessalon Perles.  She was seen again here on 10/21/2017.  Flu test was not repeated but it was felt that she may have had a flulike illness.  She was also having nausea at that time.  She was prescribed Zofran.  She reports that nausea has resolved, she is tolerating food and drink without difficulty.  She never had to actually pick up the Zofran because she was able to eat without it.  She has continued to have chills.  No fevers.  She reports persistent dry cough, chest congestion and headache.  She has been using the Tessalon with good relief of the cough.  She has been using Tylenol for headaches.  Denies myalgia.  No dizziness.  No shortness of breath.  She reports rib pain from coughing so much.   ROS: Per HPI  No Known Allergies Past Medical History:  Diagnosis Date  . CVA (cerebral vascular accident) ([place])   . Gastritis   . Headache   . Hyperlipidemia   . Hypertension     Current Outpatient Medications:  .  amoxicillin (AMOXIL) 875 MG tablet, , Disp: , Rfl:  .  atorvastatin (LIPITOR) 20 MG tablet, Take 1 tablet (20 mg total) by mouth daily at 6 PM., Disp: 90 tablet, Rfl: 3 .  benzonatate (TESSALON) 100 MG capsule, , Disp: , Rfl:  .  metoprolol tartrate (LOPRESSOR) 25 MG tablet, Take 1 tablet (25 mg total) by mouth 2 (two) times daily., Disp: 180 tablet, Rfl: 3 .  Vitamin D, Ergocalciferol, (DRISDOL) 50000 units CAPS capsule, Take 1 capsule (50,000 Units total) by mouth every 7 (seven) days., Disp: 12 capsule, Rfl: 3 .  ondansetron (ZOFRAN) 4 MG tablet, Take 1 tablet (4 mg total) by mouth every 8 (eight) hours as needed for nausea or vomiting. (Patient  not taking: Reported on 10/23/2017), Disp: 20 tablet, Rfl: 0 Social History   Socioeconomic History  . Marital status: Single    Spouse name: Not on file  . Number of children: Not on file  . Years of education: Not on file  . Highest education level: Not on file  Social Needs  . Financial resource strain: Not on file  . Food insecurity - worry: Not on file  . Food insecurity - inability: Not on file  . Transportation needs - medical: Not on file  . Transportation needs - non-medical: Not on file  Occupational History  . Not on file  Tobacco Use  . Smoking status: Never Smoker  . Smokeless tobacco: Never Used  Substance and Sexual Activity  . Alcohol use: Yes    Alcohol/week: 0.6 oz    Types: 1 Glasses of wine per week    Comment: on holidays and events  . Drug use: No  . Sexual activity: No  Other Topics Concern  . Not on file  Social History Narrative  . Not on file   Family History  Problem Relation Age of Onset  . Hypertension Mother   . Heart disease Mother   . Heart disease Father        MI  . Stomach cancer Maternal Aunt   .  Colon cancer Neg Hx     Objective: Office vital signs reviewed. BP 139/85   Pulse 79   Temp 98.2 F (36.8 C) (Oral)   Ht 5\' 4"  (1.626 m)   Wt 151 lb (68.5 kg)   SpO2 100%   BMI 25.92 kg/m   Physical Examination:  General: Awake, alert, well nourished, nontoxic.  No acute distress HEENT: Normal    Neck: No masses palpated. + anterior cervical lymph nodes on left.    Ears: Tympanic membranes intact, normal light reflex, no erythema, no bulging    Eyes: PERRLA, extraocular membranes intact, sclera white    Nose: nasal turbinates moist, clear nasal discharge    Throat: moist mucus membranes, no erythema, no tonsillar exudate.  Airway is patent Cardio: regular rate and rhythm, S1S2 heard, no murmurs appreciated Pulm: clear to auscultation bilaterally, no wheezes, rhonchi or rales; normal work of breathing on room air; intermittently  coughing during exam.  Assessment/ Plan: 67 y.o. female   1. Bronchitis Vital signs within normal limits.  She has normal oxygenation on room air.  Cardiopulmonary exam fairly unremarkable.  She does have palpable anterior cervical lymph nodes on the left.  Likely from AOM.  Given persistent symptoms, discontinue amoxicillin.  Start Z-Pak.  Tessalon Perles refilled.  Home care instructions reviewed with the patient.  She was given a single follow-up as needed.   Meds ordered this encounter  Medications  . azithromycin (ZITHROMAX Z-PAK) 250 MG tablet    Sig: As directed    Dispense:  6 tablet    Refill:  0  . benzonatate (TESSALON) 200 MG capsule    Sig: Take 1 capsule (200 mg total) by mouth 2 (two) times daily as needed for cough.    Dispense:  20 capsule    Refill:  [place], DO [place] [phone]

## 2017-10-23 NOTE — Patient Instructions (Signed)
Stop the amoxicillin.  Start the Zpak.  I have renewed your Tessalon perles in the 200mg  strength.  Take 1 capsule twice a day.   Acute Bronchitis, Adult Acute bronchitis is when air tubes (bronchi) in the lungs suddenly get swollen. The condition can make it hard to breathe. It can also cause these symptoms:  A cough.  Coughing up clear, yellow, or green mucus.  Wheezing.  Chest congestion.  Shortness of breath.  A fever.  Body aches.  Chills.  A sore throat.  Follow these instructions at home: Medicines  Take over-the-counter and prescription medicines only as told by your doctor.  If you were prescribed an antibiotic medicine, take it as told by your doctor. Do not stop taking the antibiotic even if you start to feel better. General instructions  Rest.  Drink enough fluids to keep your pee (urine) clear or pale yellow.  Avoid smoking and secondhand smoke. If you smoke and you need help quitting, ask your doctor. Quitting will help your lungs heal faster.  Use an inhaler, cool mist vaporizer, or humidifier as told by your doctor.  Keep all follow-up visits as told by your doctor. This is important. How is this prevented? To lower your risk of getting this condition again:  Wash your hands often with soap and water. If you cannot use soap and water, use hand sanitizer.  Avoid contact with people who have cold symptoms.  Try not to touch your hands to your mouth, nose, or eyes.  Make sure to get the flu shot every year.  Contact a doctor if:  Your symptoms do not get better in 2 weeks. Get help right away if:  You cough up blood.  You have chest pain.  You have very bad shortness of breath.  You become dehydrated.  You faint (pass out) or keep feeling like you are going to pass out.  You keep throwing up (vomiting).  You have a very bad headache.  Your fever or chills gets worse. This information is not intended to replace advice given to you by  your health care provider. Make sure you discuss any questions you have with your health care provider. Document Released: 01/27/2008 Document Revised: 03/18/2016 Document Reviewed: 01/29/2016 Elsevier Interactive Patient Education  [employer].

## 2018-02-25 ENCOUNTER — Ambulatory Visit (INDEPENDENT_AMBULATORY_CARE_PROVIDER_SITE_OTHER): Payer: BLUE CROSS/BLUE SHIELD

## 2018-02-25 ENCOUNTER — Encounter: Payer: Self-pay | Admitting: Family

## 2018-02-25 ENCOUNTER — Ambulatory Visit (INDEPENDENT_AMBULATORY_CARE_PROVIDER_SITE_OTHER): Payer: BLUE CROSS/BLUE SHIELD | Admitting: Family

## 2018-02-25 VITALS — BP 131/78 | HR 77 | Temp 99.2°F | Ht 64.0 in | Wt 153.6 lb

## 2018-02-25 DIAGNOSIS — M25551 Pain in right hip: Secondary | ICD-10-CM

## 2018-02-25 DIAGNOSIS — M5431 Sciatica, right side: Secondary | ICD-10-CM

## 2018-02-25 MED ORDER — DICLOFENAC SODIUM 75 MG PO TBEC: 75.0000 mg | DELAYED_RELEASE_TABLET | Freq: Two times a day (BID) | ORAL | 0 refills | Status: DC

## 2018-02-25 MED ORDER — KETOROLAC TROMETHAMINE 60 MG/2ML IM SOLN
60.0000 mg | Freq: Once | INTRAMUSCULAR | Status: AC
  Administered 2018-02-25: 60 mg via INTRAMUSCULAR

## 2018-02-25 MED ORDER — METHYLPREDNISOLONE ACETATE 80 MG/ML IJ SUSP
80.0000 mg | Freq: Once | INTRAMUSCULAR | Status: AC
  Administered 2018-02-25: 80 mg via INTRAMUSCULAR

## 2018-02-25 NOTE — Patient Instructions (Signed)
Sciatica Sciatica is pain, numbness, weakness, or tingling along the path of the sciatic nerve. The sciatic nerve starts in the lower back and runs down the back of each leg. The nerve controls the muscles in the lower leg and in the back of the knee. It also provides feeling (sensation) to the back of the thigh, the lower leg, and the sole of the foot. Sciatica is a symptom of another medical condition that pinches or puts pressure on the sciatic nerve. Generally, sciatica only affects one side of the body. Sciatica usually goes away on its own or with treatment. In some cases, sciatica may keep coming back (recur). What are the causes? This condition is caused by pressure on the sciatic nerve, or pinching of the sciatic nerve. This may be the result of:  A disk in between the bones of the spine (vertebrae) bulging out too far (herniated disk).  Age-related changes in the spinal disks (degenerative disk disease).  A pain disorder that affects a muscle in the buttock (piriformis syndrome).  Extra bone growth (bone spur) near the sciatic nerve.  An injury or break (fracture) of the pelvis.  Pregnancy.  Tumor (rare).  What increases the risk? The following factors may make you more likely to develop this condition:  Playing sports that place pressure or stress on the spine, such as football or weight lifting.  Having poor strength and flexibility.  A history of back injury.  A history of back surgery.  Sitting for long periods of time.  Doing activities that involve repetitive bending or lifting.  Obesity.  What are the signs or symptoms? Symptoms can vary from mild to very severe, and they may include:  Any of these problems in the lower back, leg, hip, or buttock: ? Mild tingling or dull aches. ? Burning sensations. ? Sharp pains.  Numbness in the back of the calf or the sole of the foot.  Leg weakness.  Severe back pain that makes movement difficult.  These  symptoms may get worse when you cough, sneeze, or laugh, or when you sit or stand for long periods of time. Being overweight may also make symptoms worse. In some cases, symptoms may recur over time. How is this diagnosed? This condition may be diagnosed based on:  Your symptoms.  A physical exam. Your health care provider may ask you to do certain movements to check whether those movements trigger your symptoms.  You may have tests, including: ? Blood tests. ? X-rays. ? MRI. ? CT scan.  How is this treated? In many cases, this condition improves on its own, without any treatment. However, treatment may include:  Reducing or modifying physical activity during periods of pain.  Exercising and stretching to strengthen your abdomen and improve the flexibility of your spine.  Icing and applying heat to the affected area.  Medicines that help: ? To relieve pain and swelling. ? To relax your muscles.  Injections of medicines that help to relieve pain, irritation, and inflammation around the sciatic nerve (steroids).  Surgery.  Follow these instructions at home: Medicines  Take over-the-counter and prescription medicines only as told by your health care provider.  Do not drive or operate heavy machinery while taking prescription pain medicine. Managing pain  If directed, apply ice to the affected area. ? Put ice in a plastic bag. ? Place a towel between your skin and the bag. ? Leave the ice on for 20 minutes, 2-3 times a day.  After icing, apply   heat to the affected area before you exercise or as often as told by your health care provider. Use the heat source that your health care provider recommends, such as a moist heat pack or a heating pad. ? Place a towel between your skin and the heat source. ? Leave the heat on for 20-30 minutes. ? Remove the heat if your skin turns bright red. This is especially important if you are unable to feel pain, heat, or cold. You may have a  greater risk of getting burned. Activity  Return to your normal activities as told by your health care provider. Ask your health care provider what activities are safe for you. ? Avoid activities that make your symptoms worse.  Take brief periods of rest throughout the day. Resting in a lying or standing position is usually better than sitting to rest. ? When you rest for longer periods, mix in some mild activity or stretching between periods of rest. This will help to prevent stiffness and pain. ? Avoid sitting for long periods of time without moving. Get up and move around at least one time each hour.  Exercise and stretch regularly, as told by your health care provider.  Do not lift anything that is heavier than 10 lb (4.5 kg) while you have symptoms of sciatica. When you do not have symptoms, you should still avoid heavy lifting, especially repetitive heavy lifting.  When you lift objects, always use proper lifting technique, which includes: ? Bending your knees. ? Keeping the load close to your body. ? Avoiding twisting. General instructions  Use good posture. ? Avoid leaning forward while sitting. ? Avoid hunching over while standing.  Maintain a healthy weight. Excess weight puts extra stress on your back and makes it difficult to maintain good posture.  Wear supportive, comfortable shoes. Avoid wearing high heels.  Avoid sleeping on a mattress that is too soft or too hard. A mattress that is firm enough to support your back when you sleep may help to reduce your pain.  Keep all follow-up visits as told by your health care provider. This is important. Contact a health care provider if:  You have pain that wakes you up when you are sleeping.  You have pain that gets worse when you lie down.  Your pain is worse than you have experienced in the past.  Your pain lasts longer than 4 weeks.  You experience unexplained weight loss. Get help right away if:  You lose control  of your bowel or bladder (incontinence).  You have: ? Weakness in your lower back, pelvis, buttocks, or legs that gets worse. ? Redness or swelling of your back. ? A burning sensation when you urinate. This information is not intended to replace advice given to you by your health care provider. Make sure you discuss any questions you have with your health care provider. Document Released: 08/04/2001 Document Revised: 01/14/2016 Document Reviewed: 04/19/2015 Elsevier Interactive Patient Education  2018 [employer] Inc.  

## 2018-02-25 NOTE — Progress Notes (Signed)
   Subjective:    Patient ID: Krista Finley, female    DOB: 1951-06-16, 67 y.o.   MRN: [id number]  Chief Complaint  Patient presents with  . Hip Pain    Hip Pain   The incident occurred 12 to 24 hours ago. There was no injury mechanism. The pain is present in the right hip and right leg. The quality of the pain is described as aching. The pain is at a severity of 8/10. The pain is moderate. The pain has been intermittent since onset. Associated symptoms include numbness and tingling. She reports no foreign bodies present. The symptoms are aggravated by weight bearing and movement. She has tried rest and non-weight bearing for the symptoms. The treatment provided mild relief.      Review of Systems  Neurological: Positive for tingling and numbness.  All other systems reviewed and are negative.      Objective:   Physical Exam  Constitutional: She is oriented to person, place, and time. She appears well-developed and well-nourished. No distress.  HENT:  Head: Normocephalic and atraumatic.  Right Ear: External ear normal.  Left Ear: External ear normal.  Mouth/Throat: Oropharynx is clear and moist.  Eyes: Pupils are equal, round, and reactive to light.  Neck: Normal range of motion. Neck supple. No thyromegaly present.  Cardiovascular: Normal rate, regular rhythm, normal heart sounds and intact distal pulses.  No murmur heard. Pulmonary/Chest: Effort normal and breath sounds normal. No respiratory distress. She has no wheezes.  Abdominal: Soft. Bowel sounds are normal. She exhibits no distension. There is no tenderness.  Musculoskeletal: Normal range of motion. She exhibits no edema or tenderness.  Negative SLR   Neurological: She is alert and oriented to person, place, and time. She has normal reflexes. No cranial nerve deficit.  Skin: Skin is warm and dry.  Psychiatric: She has a normal mood and affect. Her behavior is normal. Judgment and thought content normal.  Vitals  reviewed.   X-ray- Negative  BP 131/78   Pulse 77   Temp 99.2 F (37.3 C) (Oral)   Ht 5\' 4"  (1.626 m)   Wt 153 lb 9.6 oz (69.7 kg)   BMI 26.37 kg/m      Assessment & Plan:  Krista Finley was seen today for hip pain.  Diagnoses and all orders for this visit:  Pain of right hip joint -     DG HIP UNILAT W OR W/O PELVIS 2-3 VIEWS RIGHT; Future -     methylPREDNISolone acetate (DEPO-MEDROL) injection 80 mg -     ketorolac (TORADOL) injection 60 mg -     diclofenac (VOLTAREN) 75 MG EC tablet; Take 1 tablet (75 mg total) by mouth 2 (two) times daily.  Sciatica of right side -     methylPREDNISolone acetate (DEPO-MEDROL) injection 80 mg -     ketorolac (TORADOL) injection 60 mg -     diclofenac (VOLTAREN) 75 MG EC tablet; Take 1 tablet (75 mg total) by mouth 2 (two) times daily.   Rest Ice ROM exercises Diclofenac with meals, no other NSAID's RTO if pain worsens or does not improve  Evelina Dun, FNP

## 2018-09-28 ENCOUNTER — Other Ambulatory Visit: Payer: Self-pay | Admitting: Family

## 2018-09-28 DIAGNOSIS — E785 Hyperlipidemia, unspecified: Secondary | ICD-10-CM

## 2018-09-28 NOTE — Telephone Encounter (Signed)
Last lipid 09/09/17  Krista Finley

## 2018-11-21 ENCOUNTER — Other Ambulatory Visit: Payer: Self-pay | Admitting: Family Medicine

## 2018-11-21 DIAGNOSIS — E785 Hyperlipidemia, unspecified: Secondary | ICD-10-CM

## 2018-11-21 NOTE — Telephone Encounter (Signed)
Last seen 02/25/18  Last lipid 09/09/17

## 2018-12-03 ENCOUNTER — Other Ambulatory Visit: Payer: Self-pay | Admitting: Family

## 2018-12-03 DIAGNOSIS — I10 Essential (primary) hypertension: Secondary | ICD-10-CM

## 2018-12-05 NOTE — Telephone Encounter (Signed)
Last seen 02/25/18

## 2018-12-14 ENCOUNTER — Other Ambulatory Visit: Payer: Self-pay | Admitting: Family

## 2018-12-14 DIAGNOSIS — I10 Essential (primary) hypertension: Secondary | ICD-10-CM

## 2018-12-16 ENCOUNTER — Other Ambulatory Visit: Payer: Self-pay | Admitting: Family

## 2018-12-16 DIAGNOSIS — I10 Essential (primary) hypertension: Secondary | ICD-10-CM

## 2018-12-22 ENCOUNTER — Emergency Department (HOSPITAL_COMMUNITY): Payer: BLUE CROSS/BLUE SHIELD

## 2018-12-22 ENCOUNTER — Ambulatory Visit (INDEPENDENT_AMBULATORY_CARE_PROVIDER_SITE_OTHER): Payer: BLUE CROSS/BLUE SHIELD | Admitting: Family

## 2018-12-22 ENCOUNTER — Encounter: Payer: Self-pay | Admitting: Family

## 2018-12-22 ENCOUNTER — Other Ambulatory Visit: Payer: Self-pay

## 2018-12-22 ENCOUNTER — Encounter (HOSPITAL_COMMUNITY): Payer: Self-pay | Admitting: *Deleted

## 2018-12-22 ENCOUNTER — Emergency Department (HOSPITAL_COMMUNITY)
Admission: EM | Admit: 2018-12-22 | Discharge: 2018-12-22 | Disposition: A | Discharge status: Home / Self Care | Payer: BLUE CROSS/BLUE SHIELD | Attending: Emergency Medicine | Admitting: Emergency Medicine

## 2018-12-22 DIAGNOSIS — Z8673 Personal history of transient ischemic attack (TIA), and cerebral infarction without residual deficits: Secondary | ICD-10-CM

## 2018-12-22 DIAGNOSIS — Z79899 Other long term (current) drug therapy: Secondary | ICD-10-CM | POA: Insufficient documentation

## 2018-12-22 DIAGNOSIS — Z8249 Family history of ischemic heart disease and other diseases of the circulatory system: Secondary | ICD-10-CM | POA: Diagnosis not present

## 2018-12-22 DIAGNOSIS — I10 Essential (primary) hypertension: Secondary | ICD-10-CM | POA: Insufficient documentation

## 2018-12-22 DIAGNOSIS — R079 Chest pain, unspecified: Secondary | ICD-10-CM

## 2018-12-22 LAB — CBC WITH DIFFERENTIAL/PLATELET
Abs Immature Granulocytes: 0.01 10*3/uL (ref 0.00–0.07)
Basophils Absolute: 0 10*3/uL (ref 0.0–0.1)
Basophils Relative: 1 %
Eosinophils Absolute: 0.1 10*3/uL (ref 0.0–0.5)
Eosinophils Relative: 2 %
HCT: 44.3 % (ref 36.0–46.0)
Hemoglobin: 14 g/dL (ref 12.0–15.0)
Immature Granulocytes: 0 %
Lymphocytes Relative: 40 %
Lymphs Abs: 1.7 10*3/uL (ref 0.7–4.0)
MCH: 29.7 pg (ref 26.0–34.0)
MCHC: 31.6 g/dL (ref 30.0–36.0)
MCV: 93.9 fL (ref 80.0–100.0)
Monocytes Absolute: 0.5 10*3/uL (ref 0.1–1.0)
Monocytes Relative: 12 %
Neutro Abs: 2 10*3/uL (ref 1.7–7.7)
Neutrophils Relative %: 45 %
Platelets: 265 10*3/uL (ref 150–400)
RBC: 4.72 MIL/uL (ref 3.87–5.11)
RDW: 13.4 % (ref 11.5–15.5)
WBC: 4.3 10*3/uL (ref 4.0–10.5)
nRBC: 0 % (ref 0.0–0.2)

## 2018-12-22 LAB — COMPREHENSIVE METABOLIC PANEL
ALT: 23 U/L (ref 0–44)
AST: 30 U/L (ref 15–41)
Albumin: 4.1 g/dL (ref 3.5–5.0)
Alkaline Phosphatase: 107 U/L (ref 38–126)
Anion gap: 12 (ref 5–15)
BUN: 7 mg/dL — ABNORMAL LOW (ref 8–23)
CO2: 32 mmol/L (ref 22–32)
Calcium: 9 mg/dL (ref 8.9–10.3)
Chloride: 102 mmol/L (ref 98–111)
Creatinine, Ser: 0.94 mg/dL (ref 0.44–1.00)
GFR calc Af Amer: >60 mL/min (ref >60)
GFR calc non Af Amer: >60 mL/min (ref >60)
Glucose, Bld: 93 mg/dL (ref 70–99)
Potassium: 2.9 mmol/L — ABNORMAL LOW (ref 3.5–5.1)
Sodium: 146 mmol/L — ABNORMAL HIGH (ref 135–145)
Total Bilirubin: 0.3 mg/dL (ref 0.3–1.2)
Total Protein: 7.2 g/dL (ref 6.5–8.1)

## 2018-12-22 LAB — TROPONIN I
Troponin I: <0.03 ng/mL (ref <0.03)
Troponin I: <0.03 ng/mL (ref <0.03)

## 2018-12-22 LAB — LIPASE, BLOOD: Lipase: 31 U/L (ref 11–51)

## 2018-12-22 LAB — D-DIMER, QUANTITATIVE (NOT AT ARMC): D-Dimer, Quant: <0.27 ug/mL-FEU (ref 0.00–0.50)

## 2018-12-22 MED ORDER — METOPROLOL TARTRATE 25 MG PO TABS
25.0000 mg | ORAL_TABLET | Freq: Once | ORAL | Status: AC
  Administered 2018-12-22: 13:00:00 25 mg via ORAL
  Filled 2018-12-22: qty 1

## 2018-12-22 MED ORDER — HYDROCODONE-ACETAMINOPHEN 5-325 MG PO TABS
1.0000 | ORAL_TABLET | Freq: Once | ORAL | Status: AC
  Administered 2018-12-22: 12:00:00 1 via ORAL
  Filled 2018-12-22: qty 1

## 2018-12-22 MED ORDER — ONDANSETRON HCL 4 MG PO TABS
4.0000 mg | ORAL_TABLET | Freq: Once | ORAL | Status: AC
  Administered 2018-12-22: 12:00:00 4 mg via ORAL
  Filled 2018-12-22: qty 1

## 2018-12-22 MED ORDER — POTASSIUM CHLORIDE CRYS ER 20 MEQ PO TBCR
40.0000 meq | EXTENDED_RELEASE_TABLET | Freq: Once | ORAL | Status: AC
  Administered 2018-12-22: 13:00:00 40 meq via ORAL
  Filled 2018-12-22: qty 2

## 2018-12-22 MED ORDER — MAGNESIUM OXIDE 400 (241.3 MG) MG PO TABS
800.0000 mg | ORAL_TABLET | Freq: Once | ORAL | Status: AC
  Administered 2018-12-22: 14:00:00 800 mg via ORAL
  Filled 2018-12-22: qty 2

## 2018-12-22 MED ORDER — POTASSIUM CHLORIDE CRYS ER 20 MEQ PO TBCR
40.0000 meq | EXTENDED_RELEASE_TABLET | Freq: Once | ORAL | Status: AC
  Administered 2018-12-22: 40 meq via ORAL
  Filled 2018-12-22: qty 2

## 2018-12-22 MED ORDER — NITROGLYCERIN 0.4 MG SL SUBL
0.4000 mg | SUBLINGUAL_TABLET | SUBLINGUAL | Status: DC | PRN
  Administered 2018-12-22 (×3): 0.4 mg via SUBLINGUAL
  Filled 2018-12-22 (×2): qty 1

## 2018-12-22 MED ORDER — KETOROLAC TROMETHAMINE 10 MG PO TABS
10.0000 mg | ORAL_TABLET | Freq: Once | ORAL | Status: AC
  Administered 2018-12-22: 10 mg via ORAL
  Filled 2018-12-22: qty 1

## 2018-12-22 NOTE — ED Provider Notes (Signed)
[facility] EMERGENCY DEPARTMENT Provider Note   CSN: [id number] Arrival date & time: 12/22/18  1117    History   Chief Complaint Chief Complaint  Patient presents with  . Chest Pain    HPI Krista Finley is a 68 y.o. female.     Patient is a 68 year old female who presents to the emergency department with complaint of chest pain.  The patient states that over the past 2 weeks she has been having mid chest pain.  At times the pain radiates under the left breast, and on one occasion it radiated to her back.  She has not had problems with shortness of breath, vomiting, nausea, dizziness, or loss of consciousness.  She is not had recent injury or trauma to the chest area.  No recent fever or chills reported.  The patient states she has not been around anyone who has been diagnosed with COVID-19, and she has not been around anyone who is been ill recently.     Past Medical History:  Diagnosis Date  . CVA (cerebral vascular accident) ([place])   . Gastritis   . Headache   . Hyperlipidemia   . Hypertension     Patient Active Problem List   Diagnosis Date Noted  . Vitamin D deficiency 09/13/2017  . Cerebral infarction due to thrombosis of left middle cerebral artery ([place]) 06/11/2015  . Essential hypertension 06/11/2015  . HLD (hyperlipidemia) 06/11/2015  . History of cerebral infarction 04/25/2015    Past Surgical History:  Procedure Laterality Date  . ELBOW SURGERY Right      OB History   No obstetric history on file.      Home Medications    Prior to Admission medications   Medication Sig Start Date End Date Taking? Authorizing Provider  atorvastatin (LIPITOR) 20 MG tablet TAKE 1 TABLET BY MOUTH ONCE DAILY 6 IN THE EVENING 11/21/18   Hawks, Christy A, FNP  diclofenac (VOLTAREN) 75 MG EC tablet Take 1 tablet (75 mg total) by mouth 2 (two) times daily. 02/25/18   Sharion Balloon, FNP  metoprolol tartrate (LOPRESSOR) 25 MG tablet Take 1 tablet by mouth twice daily  12/05/18   Dettinger, Fransisca Kaufmann, MD    Family History Family History  Problem Relation Age of Onset  . Hypertension Mother   . Heart disease Mother   . Heart disease Father        MI  . Stomach cancer Maternal Aunt   . Colon cancer Neg Hx     Social History Social History   Tobacco Use  . Smoking status: Never Smoker  . Smokeless tobacco: Never Used  Substance Use Topics  . Alcohol use: Yes    Comment: 1 drink couple times of year  . Drug use: No     Allergies   Patient has no known allergies.   Review of Systems Review of Systems  Constitutional: Negative for activity change and diaphoresis.       All ROS Neg except as noted in HPI  HENT: Negative for nosebleeds.   Eyes: Negative for photophobia and discharge.  Respiratory: Positive for chest tightness. Negative for cough, shortness of breath and wheezing.   Cardiovascular: Negative for chest pain and palpitations.  Gastrointestinal: Negative for abdominal pain, blood in stool, nausea and vomiting.  Genitourinary: Negative for dysuria, frequency and hematuria.  Musculoskeletal: Negative for arthralgias, back pain and neck pain.  Skin: Negative.   Neurological: Negative for dizziness, seizures, speech difficulty and weakness.  Psychiatric/Behavioral: Negative  for confusion and hallucinations.     Physical Exam Updated Vital Signs BP (!) 222/94   Pulse 72   Temp 98.9 F (37.2 C) (Oral)   Resp 16   Ht 5\' 4"  (1.626 m)   Wt 68.9 kg   SpO2 99%   BMI 26.09 kg/m   Physical Exam Vitals signs and nursing note reviewed.  Constitutional:      Appearance: She is well-developed. She is not toxic-appearing.  HENT:     Head: Normocephalic.     Right Ear: Tympanic membrane and external ear normal.     Left Ear: Tympanic membrane and external ear normal.  Eyes:     General: Lids are normal.     Pupils: Pupils are equal, round, and reactive to light.  Neck:     Musculoskeletal: Normal range of motion and neck  supple.     Vascular: No carotid bruit.  Cardiovascular:     Rate and Rhythm: Normal rate and regular rhythm.     Pulses: Normal pulses.     Heart sounds: Normal heart sounds.  Pulmonary:     Effort: No respiratory distress.     Breath sounds: Normal breath sounds.  Chest:     Chest wall: Tenderness present.    Abdominal:     General: Bowel sounds are normal.     Palpations: Abdomen is soft.     Tenderness: There is no abdominal tenderness. There is no guarding.  Musculoskeletal: Normal range of motion.  Lymphadenopathy:     Head:     Right side of head: No submandibular adenopathy.     Left side of head: No submandibular adenopathy.     Cervical: No cervical adenopathy.  Skin:    General: Skin is warm and dry.  Neurological:     Mental Status: She is alert and oriented to person, place, and time.     Cranial Nerves: No cranial nerve deficit.     Sensory: No sensory deficit.  Psychiatric:        Speech: Speech normal.      ED Treatments / Results  Labs (all labs ordered are listed, but only abnormal results are displayed) Labs Reviewed  COMPREHENSIVE METABOLIC PANEL  LIPASE, BLOOD  CBC WITH DIFFERENTIAL/PLATELET  TROPONIN I    EKG None  Radiology No results found.  Procedures Procedures (including critical care time)  Medications Ordered in ED Medications  ketorolac (TORADOL) tablet 10 mg (has no administration in time range)  HYDROcodone-acetaminophen (NORCO/VICODIN) 5-325 MG per tablet 1 tablet (has no administration in time range)  ondansetron (ZOFRAN) tablet 4 mg (has no administration in time range)     Initial Impression / Assessment and Plan / ED Course  I have reviewed the triage vital signs and the nursing notes.  Pertinent labs & imaging results that were available during my care of the patient were reviewed by me and considered in my medical decision making (see chart for details).          Final Clinical Impressions(s) / ED  Diagnoses MDm  Blood pressure is elevated at 222/94.  Vital signs are otherwise within normal limits.  Pulse oximetry is 99% on room air.  Within normal limits by my interpretation.  Patient is in no distress at this time.  I can reproduce most of the chest pain by palpation to the sternal area and the rib area on the left.  The comprehensive metabolic panel shows the potassium to be low at 2.9.  Oral  potassium ordered for the patient.  The anion gap is normal at 12.  The lipase is normal at 31.  Doubt pancreas related pain.  The patient stated that 1 or 2 days ago she had chest pain that seemed to move to her back.  D-dimer is negative.  Doubt PE.  Chest x-ray shows no edema or consolidation.  Recheck.  Patient states pain has improved after medication.  Delta troponin pending.  Patient made aware of the heart enzymes test and all other test during this emergency department visit.  I explained to the patient that no acute events seem to be occurring at this time.  I have asked the patient to notify Dr. McDowell-cardiologist for appointment follow-up.  I have asked the patient to return to the emergency department if any changes in her condition, worsening of symptoms, problems, or concerns.  Patient is in agreement with this plan.   Final diagnoses:  Nonspecific chest pain    ED Discharge Orders    None       Lily Kocher, PA-C 12/23/18 [place], [place], MD 12/24/18 [phone]

## 2018-12-22 NOTE — Progress Notes (Signed)
   Virtual Visit via telephone Note  I connected with Krista Finley on 12/22/18 at 10:29 AM by telephone and verified that I am speaking with the correct person using two identifiers. Krista Finley is currently located at home and no one is currently with her during visit. The provider, Evelina Dun, FNP is located in their office at time of visit.  I discussed the limitations, risks, security and privacy concerns of performing an evaluation and management service by telephone and the availability of in person appointments. I also discussed with the patient that there may be a patient responsible charge related to this service. The patient expressed understanding and agreed to proceed.   History and Present Illness:  PT calls the office today with intermittent chest pain that comes and goes. She states when the pain is a 10/10 when it occurs. Can not think of any triggers and it can come while she is sitting watching T.V.  States her Dad died of a MI when he was in his 32's.  Chest Pain   This is a new problem. The current episode started in the past 7 days. The onset quality is gradual. The problem has been waxing and waning. The pain is present in the substernal region. The pain is at a severity of 10/10. The pain is moderate. The quality of the pain is described as pressure. Radiates to: back. Associated symptoms include back pain and headaches. Pertinent negatives include no cough, diaphoresis, dizziness, fever, lower extremity edema or shortness of breath. The treatment provided no relief.  Her family medical history is significant for CAD.      Review of Systems  Constitutional: Negative for diaphoresis and fever.  Respiratory: Negative for cough and shortness of breath.   Cardiovascular: Positive for chest pain.  Musculoskeletal: Positive for back pain.  Neurological: Positive for headaches. Negative for dizziness.       Observations/Objective: No SOB or distress noted   Assessment and Plan: Krista Finley comes in today with chief complaint of No chief complaint on file.   Diagnosis and orders addressed:  1. Chest pain, unspecified type  2. History of cerebral infarction  3. Family history of cardiovascular disease  Given symptoms, family hx, and risk pt advised to go to ED.  She agrees and will go now  Follow up plan: After hospital discharge     I discussed the assessment and treatment plan with the patient. The patient was provided an opportunity to ask questions and all were answered. The patient agreed with the plan and demonstrated an understanding of the instructions.   The patient was advised to call back or seek an in-person evaluation if the symptoms worsen or if the condition fails to improve as anticipated.  The above assessment and management plan was discussed with the patient. The patient verbalized understanding of and has agreed to the management plan. Patient is aware to call the clinic if symptoms persist or worsen. Patient is aware when to return to the clinic for a follow-up visit. Patient educated on when it is appropriate to go to the emergency department.    Call ended 10:41 pm, I provided 12 minutes of non-face-to-face time during this encounter.    Evelina Dun, FNP

## 2018-12-22 NOTE — Discharge Instructions (Signed)
Your cardiac enzymes are negative for acute heart event.  Your chest x-ray is negative for pneumonia, collapsed lung, or other problems.  Your chest pain seems to have improved after being in the emergency department and being treated.  Please call Dr. Domenic Polite, and set up an appointment concerning your heart chest pain.  Please use 81 mg of aspirin daily.  Use Tylenol extra strength for discomfort if needed.  Please return to the emergency department if your chest pain is not improving, or your symptoms are getting worse.  Particularly if you are having sweats, vomiting, weakness, increasing pain in your chest, difficulty speaking, or unusual fatigue when walking short distances.

## 2018-12-22 NOTE — ED Triage Notes (Signed)
Pt c/o intermittent mid chest pain that radiates under her left breast x 2 weeks. Denies SOB, nausea, dizziness.

## 2018-12-23 ENCOUNTER — Telehealth: Payer: Self-pay | Admitting: Cardiovascular Disease

## 2018-12-23 NOTE — Telephone Encounter (Signed)
Virtual Visit Pre-Appointment Phone Call  "(Name), I am calling you today to discuss your upcoming appointment. We are currently trying to limit exposure to the virus that causes COVID-19 by seeing patients at home rather than in the office."  1. "What is the BEST phone number to call the day of the visit?" - include this in appointment notes  2. Do you have or have access to (through a family member/friend) a smartphone with video capability that we can use for your visit?" a. If yes - list this number in appt notes as cell (if different from BEST phone #) and list the appointment type as a VIDEO visit in appointment notes b. If no - list the appointment type as a PHONE visit in appointment notes  3. Confirm consent - "In the setting of the current Covid19 crisis, you are scheduled for a (phone or video) visit with your provider on (date) at (time).  Just as we do with many in-office visits, in order for you to participate in this visit, we must obtain consent.  If you'd like, I can send this to your mychart (if signed up) or email for you to review.  Otherwise, I can obtain your verbal consent now.  All virtual visits are billed to your insurance company just like a normal visit would be.  By agreeing to a virtual visit, we'd like you to understand that the technology does not allow for your provider to perform an examination, and thus may limit your provider's ability to fully assess your condition. If your provider identifies any concerns that need to be evaluated in person, we will make arrangements to do so.  Finally, though the technology is pretty good, we cannot assure that it will always work on either your or our end, and in the setting of a video visit, we may have to convert it to a phone-only visit.  In either situation, we cannot ensure that we have a secure connection.  Are you willing to proceed?" STAFF: Did the patient verbally acknowledge consent to telehealth visit? Document  YES/NO here: Yes  4. Advise patient to be prepared - "Two hours prior to your appointment, go ahead and check your blood pressure, pulse, oxygen saturation, and your weight (if you have the equipment to check those) and write them all down. When your visit starts, your provider will ask you for this information. If you have an Apple Watch or Kardia device, please plan to have heart rate information ready on the day of your appointment. Please have a pen and paper handy nearby the day of the visit as well."  5. Give patient instructions for MyChart download to smartphone OR [email or web address] as below if video visit (depending on what platform provider is using)  6. Inform patient they will receive a phone call 15 minutes prior to their appointment time (may be from unknown caller ID) so they should be prepared to answer    Krista Finley has been deemed a candidate for a follow-up tele-health visit to limit community exposure during the Covid-19 pandemic. I spoke with the patient via phone to ensure availability of phone/video source, confirm preferred email & phone number, and discuss instructions and expectations.  I reminded Krista Finley to be prepared with any vital sign and/or heart rhythm information that could potentially be obtained via home monitoring, at the time of her visit. I reminded Krista Finley to expect a phone call prior to  her visit.  Bertram Gala Goins 12/23/2018 10:32 AM

## 2018-12-25 NOTE — Progress Notes (Signed)
Virtual Visit via Video Note   This visit type was conducted due to national recommendations for restrictions regarding the COVID-19 Pandemic (e.g. social distancing) in an effort to limit this patient's exposure and mitigate transmission in our community.  Due to her co-morbid illnesses, this patient is at least at moderate risk for complications without adequate follow up.  This format is felt to be most appropriate for this patient at this time.  All issues noted in this document were discussed and addressed.  A limited physical exam was performed with this format.  Please refer to the patient's chart for her consent to telehealth for [facility].   Date:  12/28/2018   ID:  Krista Finley, DOB 09-01-1950, MRN [id number]  Patient Location: Home Provider Location: Office  PCP:  Sharion Balloon, FNP  Cardiologist:  New/ [place]  Electrophysiologist:  None   Evaluation Performed:  New Patient Evaluation  Chief Complaint:  Chest Pain  History of Present Illness:    Krista Finley is a 68 y.o. female with history of HTN, HLD and CVA. Referred by Evelina Dun FNP for chest pain Reviewed ER notes from 12/22/18 2 weeks of mid sternal chest pain. Can radiate to back and left breast Pain intermittent and not related to exertion  Pain for a week or so No history of CAD.  BP was elevated at 222/94 Pain was reproducible to palpation Was on metoprolol for BP no diuretic but K was 2.9 Supplemented Rx with Toradol and Norco/Vicodin with relief in ER  No triggers for pain Rated it 10/10 worse when sitting watching TV  CXR NAD normal mediastinum   Stroke appears to have been in 2016 with negative MRI Presented with right sided weakness  MRI with acute non hemorrhagic infarct in posterior left lenticular nucleus and posterior left corona radiata Echo done at that time for stroke showed EF 55-60% no LVH no valve disease     The patient does not have symptoms concerning for COVID-19 infection  (fever, chills, cough, or new shortness of breath).    Past Medical History:  Diagnosis Date  . CVA (cerebral vascular accident) ([place])   . Gastritis   . Headache   . Hyperlipidemia   . Hypertension    Past Surgical History:  Procedure Laterality Date  . ELBOW SURGERY Right      Current Meds  Medication Sig  . aspirin EC 81 MG tablet Take 81 mg by mouth daily.  Marland Kitchen atorvastatin (LIPITOR) 20 MG tablet TAKE 1 TABLET BY MOUTH ONCE DAILY 6 IN THE EVENING  . diclofenac (VOLTAREN) 75 MG EC tablet Take 1 tablet (75 mg total) by mouth 2 (two) times daily.  . metoprolol tartrate (LOPRESSOR) 25 MG tablet Take 1 tablet by mouth twice daily     Allergies:   Patient has no known allergies.   Social History   Tobacco Use  . Smoking status: Never Smoker  . Smokeless tobacco: Never Used  Substance Use Topics  . Alcohol use: Yes    Comment: 1 drink couple times of year  . Drug use: No     Family Hx: The patient's family history includes Heart disease in her father and mother; Hypertension in her mother; Stomach cancer in her maternal aunt. There is no history of Colon cancer.  ROS:   Please see the history of present illness.     All other systems reviewed and are negative.   Prior CV studies:   The following studies  were reviewed today:  Notes from ER, labs, CXR TTE 2016   Labs/Other Tests and Data Reviewed:    EKG:   12/23/18 NSR rate 68 normal   Recent Labs: 12/22/2018: ALT 23; BUN 7; Creatinine, Ser 0.94; Hemoglobin 14.0; Platelets 265; Potassium 2.9; Sodium 146   Recent Lipid Panel Lab Results  Component Value Date/Time   CHOL 190 09/09/2017 01:10 PM   TRIG 75 09/09/2017 01:10 PM   HDL 73 09/09/2017 01:10 PM   CHOLHDL 2.6 09/09/2017 01:10 PM   CHOLHDL 2.9 04/26/2015 04:34 AM   LDLCALC 102 (H) 09/09/2017 01:10 PM    Wt Readings from Last 3 Encounters:  12/28/18 69.9 kg  12/22/18 68.9 kg  02/25/18 69.7 kg     Objective:    Vital Signs:  BP (!) 148/84   Pulse  62   Ht 5\' 4"  (1.626 m)   Wt 69.9 kg   BMI 26.43 kg/m    Overweight black female No distress No tachypnea No JVP elevation No edema No residual neurologic defects   ASSESSMENT & PLAN:    1. HTN:  Seems suboptimally controlled and likely contributed to stroke in 2016 Normal Cr low K She is compliant with metroprolol She has 2 different BP cuffs at home and will keep a record. She knows to eat less salt f/u 3 months  2. CVA:  2016 no carotid disease lacunar stable 3. Chest Pain atypical r/o ECG normal f/u lexiscan myovue  4. HLD:  Continue lipitor labs with primary   COVID-19 Education: The signs and symptoms of COVID-19 were discussed with the patient and how to seek care for testing (follow up with PCP or arrange E-visit).  The importance of social distancing was discussed today.  Time:   Today, I have spent 30 minutes with the patient with telehealth technology discussing the above problems.     Medication Adjustments/Labs and Tests Ordered: Current medicines are reviewed at length with the patient today.  Concerns regarding medicines are outlined above.   Tests Ordered:  Lexiscan myovue   Medication Changes: No orders of the defined types were placed in this encounter.   Disposition:  Follow up 3 months if myovue normal   Signed, Jenkins Rouge, MD  12/28/2018 2:04 PM    [place]

## 2018-12-28 ENCOUNTER — Encounter: Payer: Self-pay | Admitting: Cardiovascular Disease

## 2018-12-28 ENCOUNTER — Telehealth (INDEPENDENT_AMBULATORY_CARE_PROVIDER_SITE_OTHER): Payer: BLUE CROSS/BLUE SHIELD | Admitting: Cardiovascular Disease

## 2018-12-28 VITALS — BP 148/84 | HR 62 | Ht 64.0 in | Wt 154.0 lb

## 2018-12-28 DIAGNOSIS — I10 Essential (primary) hypertension: Secondary | ICD-10-CM

## 2018-12-28 DIAGNOSIS — R079 Chest pain, unspecified: Secondary | ICD-10-CM | POA: Diagnosis not present

## 2018-12-28 NOTE — Progress Notes (Signed)
Medication Instructions:  Your physician recommends that you continue on your current medications as directed. Please refer to the Current Medication list given to you today.   Labwork: none  Testing/Procedures: Your physician has requested that you have a lexiscan myoview. For further information please visit [email or web address]. Please follow instruction sheet, as given.   Follow-Up:  Your physician recommends that you schedule a follow-up appointment in: 3 months  Any Other Special Instructions Will Be Listed Below (If Applicable).  If you need a refill on your cardiac medications before your next appointment, please call your pharmacy.  

## 2018-12-28 NOTE — Addendum Note (Signed)
Addended by: Debbora Lacrosse R on: 12/28/2018 02:17 PM   Modules accepted: Orders

## 2019-01-02 ENCOUNTER — Other Ambulatory Visit: Payer: Self-pay | Admitting: Family

## 2019-01-02 DIAGNOSIS — E785 Hyperlipidemia, unspecified: Secondary | ICD-10-CM

## 2019-02-13 ENCOUNTER — Other Ambulatory Visit: Payer: Self-pay | Admitting: Family

## 2019-02-13 DIAGNOSIS — E785 Hyperlipidemia, unspecified: Secondary | ICD-10-CM

## 2019-02-14 ENCOUNTER — Other Ambulatory Visit: Payer: Self-pay | Admitting: Family

## 2019-02-14 DIAGNOSIS — E785 Hyperlipidemia, unspecified: Secondary | ICD-10-CM

## 2019-02-15 ENCOUNTER — Other Ambulatory Visit: Payer: Self-pay

## 2019-02-16 ENCOUNTER — Ambulatory Visit (INDEPENDENT_AMBULATORY_CARE_PROVIDER_SITE_OTHER): Payer: BC Managed Care – PPO | Admitting: Family

## 2019-02-16 ENCOUNTER — Encounter: Payer: Self-pay | Admitting: Family

## 2019-02-16 VITALS — BP 183/91 | HR 73 | Temp 98.0°F | Ht 64.0 in | Wt 152.8 lb

## 2019-02-16 DIAGNOSIS — M545 Low back pain, unspecified: Secondary | ICD-10-CM

## 2019-02-16 DIAGNOSIS — I10 Essential (primary) hypertension: Secondary | ICD-10-CM

## 2019-02-16 DIAGNOSIS — R109 Unspecified abdominal pain: Secondary | ICD-10-CM

## 2019-02-16 DIAGNOSIS — R10A0 Flank pain, unspecified side: Secondary | ICD-10-CM

## 2019-02-16 LAB — URINALYSIS, COMPLETE
Bilirubin, UA: NEGATIVE
Glucose, UA: NEGATIVE
Ketones, UA: NEGATIVE
Leukocytes,UA: NEGATIVE
Nitrite, UA: NEGATIVE
Protein,UA: NEGATIVE
RBC, UA: NEGATIVE
Specific Gravity, UA: 1.025 (ref 1.005–1.030)
Urobilinogen, Ur: 0.2 mg/dL (ref 0.2–1.0)
pH, UA: 6.5 (ref 5.0–7.5)

## 2019-02-16 LAB — MICROSCOPIC EXAMINATION
RBC: NONE SEEN /hpf (ref 0–2)
Renal Epithel, UA: NONE SEEN /hpf

## 2019-02-16 MED ORDER — AMLODIPINE BESYLATE 5 MG PO TABS: 5.0000 mg | ORAL_TABLET | Freq: Every day | ORAL | 3 refills | Status: DC

## 2019-02-16 MED ORDER — KETOROLAC TROMETHAMINE 60 MG/2ML IM SOLN
60.0000 mg | Freq: Once | INTRAMUSCULAR | Status: AC
  Administered 2019-02-16: 60 mg via INTRAMUSCULAR

## 2019-02-16 MED ORDER — MELOXICAM 7.5 MG PO TABS: ORAL_TABLET | ORAL | 2 refills | Status: DC

## 2019-02-16 MED ORDER — METHYLPREDNISOLONE ACETATE 80 MG/ML IJ SUSP
80.0000 mg | Freq: Once | INTRAMUSCULAR | Status: AC
  Administered 2019-02-16: 80 mg via INTRAMUSCULAR

## 2019-02-16 NOTE — Patient Instructions (Signed)
Acute Back Pain, Adult  Acute back pain is sudden and usually short-lived. It is often caused by an injury to the muscles and tissues in the back. The injury may result from:   A muscle or ligament getting overstretched or torn (strained). Ligaments are tissues that connect bones to each other. Lifting something improperly can cause a back strain.   Wear and tear (degeneration) of the spinal disks. Spinal disks are circular tissue that provides cushioning between the bones of the spine (vertebrae).   Twisting motions, such as while playing sports or doing yard work.   A hit to the back.   Arthritis.  You may have a physical exam, lab tests, and imaging tests to find the cause of your pain. Acute back pain usually goes away with rest and home care.  Follow these instructions at home:  Managing pain, stiffness, and swelling   Take over-the-counter and prescription medicines only as told by your health care provider.   Your health care provider may recommend applying ice during the first 24-48 hours after your pain starts. To do this:  ? Put ice in a plastic bag.  ? Place a towel between your skin and the bag.  ? Leave the ice on for 20 minutes, 2-3 times a day.   If directed, apply heat to the affected area as often as told by your health care provider. Use the heat source that your health care provider recommends, such as a moist heat pack or a heating pad.  ? Place a towel between your skin and the heat source.  ? Leave the heat on for 20-30 minutes.  ? Remove the heat if your skin turns bright red. This is especially important if you are unable to feel pain, heat, or cold. You have a greater risk of getting burned.  Activity     Do not stay in bed. Staying in bed for more than 1-2 days can delay your recovery.   Sit up and stand up straight. Avoid leaning forward when you sit, or hunching over when you stand.  ? If you work at a desk, sit close to it so you do not need to lean over. Keep your chin tucked  in. Keep your neck drawn back, and keep your elbows bent at a right angle. Your arms should look like the letter "L."  ? Sit high and close to the steering wheel when you drive. Add lower back (lumbar) support to your car seat, if needed.   Take short walks on even surfaces as soon as you are able. Try to increase the length of time you walk each day.   Do not sit, drive, or stand in one place for more than 30 minutes at a time. Sitting or standing for long periods of time can put stress on your back.   Do not drive or use heavy machinery while taking prescription pain medicine.   Use proper lifting techniques. When you bend and lift, use positions that put less stress on your back:  ? Bend your knees.  ? Keep the load close to your body.  ? Avoid twisting.   Exercise regularly as told by your health care provider. Exercising helps your back heal faster and helps prevent back injuries by keeping muscles strong and flexible.   Work with a physical therapist to make a safe exercise program, as recommended by your health care provider. Do any exercises as told by your physical therapist.  Lifestyle   Maintain   a healthy weight. Extra weight puts stress on your back and makes it difficult to have good posture.   Avoid activities or situations that make you feel anxious or stressed. Stress and anxiety increase muscle tension and can make back pain worse. Learn ways to manage anxiety and stress, such as through exercise.  General instructions   Sleep on a firm mattress in a comfortable position. Try lying on your side with your knees slightly bent. If you lie on your back, put a pillow under your knees.   Follow your treatment plan as told by your health care provider. This may include:  ? Cognitive or behavioral therapy.  ? Acupuncture or massage therapy.  ? Meditation or yoga.  Contact a health care provider if:   You have pain that is not relieved with rest or medicine.   You have increasing pain going down  into your legs or buttocks.   Your pain does not improve after 2 weeks.   You have pain at night.   You lose weight without trying.   You have a fever or chills.  Get help right away if:   You develop new bowel or bladder control problems.   You have unusual weakness or numbness in your arms or legs.   You develop nausea or vomiting.   You develop abdominal pain.   You feel faint.  Summary   Acute back pain is sudden and usually short-lived.   Use proper lifting techniques. When you bend and lift, use positions that put less stress on your back.   Take over-the-counter and prescription medicines and apply heat or ice as directed by your health care provider.  This information is not intended to replace advice given to you by your health care provider. Make sure you discuss any questions you have with your health care provider.  Document Released: 08/10/2005 Document Revised: 03/17/2018 Document Reviewed: 03/24/2017  Elsevier Interactive Patient Education  2019 [employer]

## 2019-02-16 NOTE — Progress Notes (Signed)
Subjective:    Patient ID: Krista Finley, female    DOB: 1951/01/15, 68 y.o.   MRN: [id number]  Chief Complaint  Patient presents with  . back pain ,lower right flank    Back Pain This is a new problem. The current episode started in the past 7 days (Sunday). The problem occurs intermittently. The problem has been gradually worsening since onset. Pain location: right lower  The quality of the pain is described as aching. The pain does not radiate. The pain is at a severity of 8/10. The pain is moderate. Pertinent negatives include no bladder incontinence, bowel incontinence, dysuria, tingling or weakness. Risk factors include obesity. She has tried NSAIDs for the symptoms. The treatment provided mild relief.      Review of Systems  Gastrointestinal: Negative for bowel incontinence.  Genitourinary: Negative for bladder incontinence and dysuria.  Musculoskeletal: Positive for back pain.  Neurological: Negative for tingling and weakness.  All other systems reviewed and are negative.      Objective:   Physical Exam Vitals signs reviewed.  Constitutional:      General: She is not in acute distress.    Appearance: She is well-developed.  HENT:     Head: Normocephalic and atraumatic.     Right Ear: Tympanic membrane normal.     Left Ear: Tympanic membrane normal.  Eyes:     Pupils: Pupils are equal, round, and reactive to light.  Neck:     Musculoskeletal: Normal range of motion and neck supple.     Thyroid: No thyromegaly.  Cardiovascular:     Rate and Rhythm: Normal rate and regular rhythm.     Heart sounds: Normal heart sounds. No murmur.  Pulmonary:     Effort: Pulmonary effort is normal. No respiratory distress.     Breath sounds: Normal breath sounds. No wheezing.  Abdominal:     General: Bowel sounds are normal. There is no distension.     Palpations: Abdomen is soft.     Tenderness: There is no abdominal tenderness. There is right CVA tenderness.   Musculoskeletal: Normal range of motion.        General: No tenderness.  Skin:    General: Skin is warm and dry.  Neurological:     Mental Status: She is alert and oriented to person, place, and time.     Cranial Nerves: No cranial nerve deficit.     Deep Tendon Reflexes: Reflexes are normal and symmetric.  Psychiatric:        Behavior: Behavior normal.        Thought Content: Thought content normal.        Judgment: Judgment normal.       BP (!) 183/91   Pulse 73   Temp 98 F (36.7 C) (Oral)   Ht '5\' 4"'  (1.626 m)   Wt 152 lb 12.8 oz (69.3 kg)   BMI 26.23 kg/m      Assessment & Plan:  Krista Finley comes in today with chief complaint of back pain ,lower right flank   Diagnosis and orders addressed:  1. Flank pain, acute - Urinalysis, Complete - BMP8+EGFR  2. Essential hypertension Added Norvasc  -Dash diet information given -Exercise encouraged - Stress Management  -Continue current meds -RTO in 2 weeks  - BMP8+EGFR - amLODipine (NORVASC) 5 MG tablet; Take 1 tablet (5 mg total) by mouth daily.  Dispense: 90 tablet; Refill: 3  3. Acute right-sided low back pain without sciatica Rest ROM exercises  encouarged - BMP8+EGFR - methylPREDNISolone acetate (DEPO-MEDROL) injection 80 mg - ketorolac (TORADOL) injection 60 mg   Evelina Dun, FNP

## 2019-02-17 ENCOUNTER — Ambulatory Visit: Payer: BLUE CROSS/BLUE SHIELD | Admitting: Family

## 2019-02-17 LAB — BMP8+EGFR
BUN/Creatinine Ratio: 15 (ref 12–28)
BUN: 12 mg/dL (ref 8–27)
CO2: 23 mmol/L (ref 20–29)
Calcium: 9.2 mg/dL (ref 8.7–10.3)
Chloride: 103 mmol/L (ref 96–106)
Creatinine, Ser: 0.79 mg/dL (ref 0.57–1.00)
GFR calc Af Amer: 91 mL/min/{1.73_m2} (ref >59)
GFR calc non Af Amer: 79 mL/min/{1.73_m2} (ref >59)
Glucose: 81 mg/dL (ref 65–99)
Potassium: 4.6 mmol/L (ref 3.5–5.2)
Sodium: 145 mmol/L — ABNORMAL HIGH (ref 134–144)

## 2019-02-22 ENCOUNTER — Encounter (HOSPITAL_COMMUNITY): Payer: BC Managed Care – PPO

## 2019-02-22 ENCOUNTER — Encounter (HOSPITAL_COMMUNITY): Payer: BLUE CROSS/BLUE SHIELD

## 2019-02-26 ENCOUNTER — Other Ambulatory Visit: Payer: Self-pay | Admitting: Family

## 2019-02-26 DIAGNOSIS — E785 Hyperlipidemia, unspecified: Secondary | ICD-10-CM

## 2019-02-27 NOTE — Telephone Encounter (Signed)
[place]. NTBS 30 days given 02/14/19

## 2019-03-06 NOTE — Progress Notes (Signed)
Date:  03/10/2019   ID:  Krista Finley, DOB [birth year], MRN [id number]   PCP:  Sharion Balloon, FNP  Cardiologist:  New/ Johnsie Cancel  Electrophysiologist:  None   Evaluation Performed:  F/U visit  Chief Complaint:  Chest Pain  History of Present Illness:    Krista Finley is a 68 y.o. female with history of HTN, HLD and CVA. Referred by Krista Dun FNP for chest pain First tele vist done 12/28/18  Reviewed ER notes from 12/22/18 2 weeks of mid sternal chest pain. Can radiate to back and left breast Pain intermittent and not related to exertion  Pain for a week or so No history of CAD.  BP was elevated at 222/94 Pain was reproducible to palpation Was on metoprolol for BP no diuretic but K was 2.9 Supplemented Rx with Toradol and Norco/Vicodin with relief in ER  No triggers for pain Rated it 10/10 worse when sitting watching TV  CXR NAD normal mediastinum   Stroke appears to have been in 2016 with negative MRI Presented with right sided weakness  MRI with acute non hemorrhagic infarct in posterior left lenticular nucleus and posterior left corona radiata Echo done at that time for stroke showed EF 55-60% no LVH no valve disease   Myovue normal 03/09/19 EF 55-60%  Primary added norvasc for BP 02/16/19  Having some sciatic pain and flank pain Rx Toradol and prednisone UA was negative   Has two disabled family members and 61 yo mom at home she needs to care for No further pains   The patient does not have symptoms concerning for COVID-19 infection (fever, chills, cough, or new shortness of breath).    Past Medical History:  Diagnosis Date  . CVA (cerebral vascular accident) ([place])   . Gastritis   . Headache   . Hyperlipidemia   . Hypertension    Past Surgical History:  Procedure Laterality Date  . ELBOW SURGERY Right      Current Meds  Medication Sig  . amLODipine (NORVASC) 5 MG tablet Take 1 tablet (5 mg total) by mouth daily.  Marland Kitchen aspirin EC 81 MG tablet Take 81 mg by  mouth daily.  Marland Kitchen atorvastatin (LIPITOR) 20 MG tablet TAKE 1 TABLET BY MOUTH ONCE DAILY AT  6PM  (MUST  BE  SEEN)  . meloxicam (MOBIC) 7.5 MG tablet TAKE 1 TABLET BY MOUTH EVERY 12 HOURS WITH FOOD AS NEEDED FOR PAIN  . metoprolol tartrate (LOPRESSOR) 25 MG tablet Take 1 tablet by mouth twice daily     Allergies:   Patient has no known allergies.   Social History   Tobacco Use  . Smoking status: Never Smoker  . Smokeless tobacco: Never Used  Substance Use Topics  . Alcohol use: Yes    Comment: 1 drink couple times of year  . Drug use: No     Family Hx: The patient's family history includes Heart disease in her father and mother; Hypertension in her mother; Stomach cancer in her maternal aunt. There is no history of Colon cancer.  ROS:   Please see the history of present illness.     All other systems reviewed and are negative.   Prior CV studies:   The following studies were reviewed today:  Notes from ER, labs, CXR TTE 2016   Labs/Other Tests and Data Reviewed:    EKG:   12/23/18 NSR rate 68 normal   Recent Labs: 12/22/2018: ALT 23; Hemoglobin 14.0; Platelets 265 02/16/2019: BUN  12; Creatinine, Ser 0.79; Potassium 4.6; Sodium 145   Recent Lipid Panel Lab Results  Component Value Date/Time   CHOL 190 09/09/2017 01:10 PM   TRIG 75 09/09/2017 01:10 PM   HDL 73 09/09/2017 01:10 PM   CHOLHDL 2.6 09/09/2017 01:10 PM   CHOLHDL 2.9 04/26/2015 04:34 AM   LDLCALC 102 (H) 09/09/2017 01:10 PM    Wt Readings from Last 3 Encounters:  03/10/19 150 lb (68 kg)  02/16/19 152 lb 12.8 oz (69.3 kg)  12/28/18 154 lb (69.9 kg)     Objective:    Vital Signs:  BP 105/69   Pulse 64   Temp 97.8 F (36.6 C)   Ht 5\' 4"  (1.626 m)   Wt 150 lb (68 kg)   BMI 25.75 kg/m    Affect appropriate Overweight black female HEENT: normal Neck supple with no adenopathy JVP normal no bruits no thyromegaly Lungs clear with no wheezing and good diaphragmatic motion Heart:  S1/S2 no murmur, no  rub, gallop or click PMI normal Abdomen: benighn, BS positve, no tenderness, no AAA no bruit.  No HSM or HJR Distal pulses intact with no bruits No edema Neuro non-focal Skin warm and dry No muscular weakness   ASSESSMENT & PLAN:    1. HTN:  norvasc added by primary few weeks ago improved  2. CVA:  2016 no carotid disease lacunar stable 3. Chest Pain atypical r/o ECG normal Myovue normal observe  4. HLD:  Continue lipitor labs with primary  5. Flank pain:  UA negative Toradol/ Prednisone will exacerbate BP consider f/u CT imaging per primary  COVID-19 Education: The signs and symptoms of COVID-19 were discussed with the patient and how to seek care for testing (follow up with PCP or arrange E-visit).  The importance of social distancing was discussed today.  Time:   Today, I have spent 30 minutes with the patient     Medication Adjustments/Labs and Tests Ordered: Current medicines are reviewed at length with the patient today.  Concerns regarding medicines are outlined above.   Tests Ordered:  None   Medication Changes: No orders of the defined types were placed in this encounter.   Disposition:  Follow up PRN   Signed, Jenkins Rouge, MD  03/10/2019 10:35 AM    [place]

## 2019-03-08 ENCOUNTER — Ambulatory Visit (HOSPITAL_COMMUNITY)
Admission: RE | Admit: 2019-03-08 | Discharge: 2019-03-08 | Disposition: A | Discharge status: Home / Self Care | Payer: BC Managed Care – PPO | Source: Ambulatory Visit | Attending: Cardiovascular Disease | Admitting: Cardiovascular Disease

## 2019-03-08 ENCOUNTER — Other Ambulatory Visit: Payer: Self-pay

## 2019-03-08 ENCOUNTER — Encounter (HOSPITAL_COMMUNITY)
Admission: RE | Admit: 2019-03-08 | Discharge: 2019-03-08 | Disposition: A | Discharge status: Home / Self Care | Payer: BC Managed Care – PPO | Source: Ambulatory Visit | Attending: Cardiovascular Disease | Admitting: Cardiovascular Disease

## 2019-03-08 DIAGNOSIS — R079 Chest pain, unspecified: Secondary | ICD-10-CM | POA: Insufficient documentation

## 2019-03-09 ENCOUNTER — Encounter (HOSPITAL_BASED_OUTPATIENT_CLINIC_OR_DEPARTMENT_OTHER)
Admission: RE | Admit: 2019-03-09 | Discharge: 2019-03-09 | Disposition: A | Discharge status: Home / Self Care | Payer: BC Managed Care – PPO | Source: Ambulatory Visit | Attending: Cardiovascular Disease | Admitting: Cardiovascular Disease

## 2019-03-09 ENCOUNTER — Encounter (HOSPITAL_COMMUNITY): Payer: Self-pay

## 2019-03-09 ENCOUNTER — Encounter (HOSPITAL_COMMUNITY)
Admission: RE | Admit: 2019-03-09 | Discharge: 2019-03-09 | Disposition: A | Discharge status: Home / Self Care | Payer: BC Managed Care – PPO | Source: Ambulatory Visit | Attending: Cardiovascular Disease | Admitting: Cardiovascular Disease

## 2019-03-09 DIAGNOSIS — R079 Chest pain, unspecified: Secondary | ICD-10-CM | POA: Diagnosis not present

## 2019-03-09 LAB — NM MYOCAR MULTI W/SPECT W/WALL MOTION / EF
LV dias vol: 64 mL (ref 46–106)
LV sys vol: 24 mL
Peak HR: 93 {beats}/min
RATE: 0.27
Rest HR: 57 {beats}/min
SDS: 1
SRS: 0
SSS: 1
TID: 1.08

## 2019-03-09 MED ORDER — TECHNETIUM TC 99M TETROFOSMIN IV KIT
30.0000 | PACK | Freq: Once | INTRAVENOUS | Status: AC | PRN
  Administered 2019-03-09: 29.7 via INTRAVENOUS

## 2019-03-09 MED ORDER — TECHNETIUM TC 99M TETROFOSMIN IV KIT
10.0000 | PACK | Freq: Once | INTRAVENOUS | Status: AC | PRN
  Administered 2019-03-09: 10.33 via INTRAVENOUS

## 2019-03-09 MED ORDER — SODIUM CHLORIDE 0.9% FLUSH
INTRAVENOUS | Status: AC
  Administered 2019-03-09: 10 mL via INTRAVENOUS
  Filled 2019-03-09: qty 10

## 2019-03-09 MED ORDER — REGADENOSON 0.4 MG/5ML IV SOLN
INTRAVENOUS | Status: AC
  Administered 2019-03-09: 0.4 mg via INTRAVENOUS
  Filled 2019-03-09: qty 5

## 2019-03-10 ENCOUNTER — Ambulatory Visit: Payer: BC Managed Care – PPO | Admitting: Cardiovascular Disease

## 2019-03-10 ENCOUNTER — Other Ambulatory Visit: Payer: Self-pay

## 2019-03-10 ENCOUNTER — Encounter: Payer: Self-pay | Admitting: Cardiovascular Disease

## 2019-03-10 VITALS — BP 105/69 | HR 64 | Temp 97.8°F | Ht 64.0 in | Wt 150.0 lb

## 2019-03-10 DIAGNOSIS — I10 Essential (primary) hypertension: Secondary | ICD-10-CM | POA: Diagnosis not present

## 2019-03-10 DIAGNOSIS — R079 Chest pain, unspecified: Secondary | ICD-10-CM

## 2019-03-10 NOTE — Patient Instructions (Signed)
Medication Instructions:  Your physician recommends that you continue on your current medications as directed. Please refer to the Current Medication list given to you today.   Labwork: NONE  Testing/Procedures: NONE  Follow-Up: Your physician recommends that you schedule a follow-up appointment in: AS NEEDED      Any Other Special Instructions Will Be Listed Below (If Applicable).     If you need a refill on your cardiac medications before your next appointment, please call your pharmacy.   

## 2019-03-21 ENCOUNTER — Other Ambulatory Visit: Payer: Self-pay | Admitting: Family

## 2019-03-21 DIAGNOSIS — E785 Hyperlipidemia, unspecified: Secondary | ICD-10-CM

## 2019-03-22 NOTE — Telephone Encounter (Signed)
[place]. NTBS 30 days given 02/14/19

## 2019-03-25 ENCOUNTER — Other Ambulatory Visit: Payer: Self-pay | Admitting: Family

## 2019-03-25 DIAGNOSIS — E785 Hyperlipidemia, unspecified: Secondary | ICD-10-CM

## 2019-03-27 ENCOUNTER — Other Ambulatory Visit: Payer: Self-pay

## 2019-03-27 ENCOUNTER — Encounter: Payer: Self-pay | Admitting: Family

## 2019-03-27 ENCOUNTER — Ambulatory Visit (INDEPENDENT_AMBULATORY_CARE_PROVIDER_SITE_OTHER): Payer: BC Managed Care – PPO | Admitting: Family

## 2019-03-27 DIAGNOSIS — E559 Vitamin D deficiency, unspecified: Secondary | ICD-10-CM

## 2019-03-27 DIAGNOSIS — E785 Hyperlipidemia, unspecified: Secondary | ICD-10-CM | POA: Diagnosis not present

## 2019-03-27 DIAGNOSIS — I10 Essential (primary) hypertension: Secondary | ICD-10-CM | POA: Diagnosis not present

## 2019-03-27 DIAGNOSIS — Z8673 Personal history of transient ischemic attack (TIA), and cerebral infarction without residual deficits: Secondary | ICD-10-CM

## 2019-03-27 MED ORDER — AMLODIPINE BESYLATE 5 MG PO TABS: 5.0000 mg | ORAL_TABLET | Freq: Every day | ORAL | 3 refills | Status: DC

## 2019-03-27 MED ORDER — ATORVASTATIN CALCIUM 20 MG PO TABS: ORAL_TABLET | ORAL | 1 refills | Status: DC

## 2019-03-27 MED ORDER — METOPROLOL TARTRATE 25 MG PO TABS: 25.0000 mg | ORAL_TABLET | Freq: Two times a day (BID) | ORAL | 1 refills | Status: DC

## 2019-03-27 NOTE — Progress Notes (Signed)
Virtual Visit via telephone Note Due to COVID-19 pandemic this visit was conducted virtually. This visit type was conducted due to national recommendations for restrictions regarding the COVID-19 Pandemic (e.g. social distancing, sheltering in place) in an effort to limit this patient's exposure and mitigate transmission in our community. All issues noted in this document were discussed and addressed.  A physical exam was not performed with this format.  I connected with Krista Finley on 03/27/19 at 12:00 pm by telephone and verified that I am speaking with the correct person using two identifiers. Krista Finley is currently located at driving  and no one  is currently with her during visit. The provider, Evelina Dun, FNP is located in their office at time of visit.  I discussed the limitations, risks, security and privacy concerns of performing an evaluation and management service by telephone and the availability of in person appointments. I also discussed with the patient that there may be a patient responsible charge related to this service. The patient expressed understanding and agreed to proceed.   History and Present Illness:  Pt calls the office today for chronic follow up. She saw the Cardiologists a few weeks ago. She had a negative stress test and states she is doing well.   Hypertension This is a chronic problem. The current episode started more than 1 year ago. The problem has been resolved since onset. The problem is controlled. Pertinent negatives include no headaches, malaise/fatigue, peripheral edema or shortness of breath. Risk factors for coronary artery disease include dyslipidemia and sedentary lifestyle. The current treatment provides moderate improvement. Hypertensive end-organ damage includes CVA.  Hyperlipidemia This is a chronic problem. The current episode started more than 1 year ago. The problem is uncontrolled. Recent lipid tests were reviewed and are  high. Exacerbating diseases include obesity. Pertinent negatives include no shortness of breath. Current antihyperlipidemic treatment includes statins. The current treatment provides moderate improvement of lipids. Risk factors for coronary artery disease include dyslipidemia, hypertension, a sedentary lifestyle and post-menopausal.      Review of Systems  Constitutional: Negative for malaise/fatigue.  Respiratory: Negative for shortness of breath.   Neurological: Negative for headaches.     Observations/Objective: No SOB or distress noted , 132/78  Assessment and Plan: Krista Finley comes in today with chief complaint of No chief complaint on file.   Diagnosis and orders addressed:  1. Essential hypertension - CMP14+EGFR; Future - CBC with Differential/Platelet; Future - metoprolol tartrate (LOPRESSOR) 25 MG tablet; Take 1 tablet (25 mg total) by mouth 2 (two) times daily.  Dispense: 180 tablet; Refill: 1 - amLODipine (NORVASC) 5 MG tablet; Take 1 tablet (5 mg total) by mouth daily.  Dispense: 90 tablet; Refill: 3  2. History of cerebral infarction - CMP14+EGFR; Future - CBC with Differential/Platelet; Future - Lipid panel; Future  3. Vitamin D deficiency - CMP14+EGFR; Future - CBC with Differential/Platelet; Future  4. Hyperlipidemia, unspecified hyperlipidemia type - CMP14+EGFR; Future - CBC with Differential/Platelet; Future - Lipid panel; Future - atorvastatin (LIPITOR) 20 MG tablet; TAKE 1 TABLET BY MOUTH ONCE DAILY AT  6PM  (MUST  BE  SEEN)  Dispense: 90 tablet; Refill: 1   Labs pending Health Maintenance reviewed Diet and exercise encouraged  Follow up plan: 6 months      I discussed the assessment and treatment plan with the patient. The patient was provided an opportunity to ask questions and all were answered. The patient agreed with the plan and demonstrated an understanding  of the instructions.   The patient was advised to call back or seek an  in-person evaluation if the symptoms worsen or if the condition fails to improve as anticipated.  The above assessment and management plan was discussed with the patient. The patient verbalized understanding of and has agreed to the management plan. Patient is aware to call the clinic if symptoms persist or worsen. Patient is aware when to return to the clinic for a follow-up visit. Patient educated on when it is appropriate to go to the emergency department.   Time call ended: 12:12 pm   I provided 12:12 pm minutes of non-face-to-face time during this encounter.    Evelina Dun, FNP

## 2019-05-01 ENCOUNTER — Emergency Department (HOSPITAL_COMMUNITY): Payer: BC Managed Care – PPO

## 2019-05-01 ENCOUNTER — Other Ambulatory Visit: Payer: Self-pay

## 2019-05-01 ENCOUNTER — Emergency Department (HOSPITAL_COMMUNITY)
Admission: EM | Admit: 2019-05-01 | Discharge: 2019-05-01 | Disposition: A | Discharge status: Home / Self Care | Payer: BC Managed Care – PPO | Attending: Emergency Medicine | Admitting: Emergency Medicine

## 2019-05-01 ENCOUNTER — Encounter (HOSPITAL_COMMUNITY): Payer: Self-pay | Admitting: Emergency Medicine

## 2019-05-01 DIAGNOSIS — Z7982 Long term (current) use of aspirin: Secondary | ICD-10-CM | POA: Insufficient documentation

## 2019-05-01 DIAGNOSIS — R109 Unspecified abdominal pain: Secondary | ICD-10-CM

## 2019-05-01 DIAGNOSIS — Z79899 Other long term (current) drug therapy: Secondary | ICD-10-CM | POA: Insufficient documentation

## 2019-05-01 DIAGNOSIS — R1030 Lower abdominal pain, unspecified: Secondary | ICD-10-CM | POA: Insufficient documentation

## 2019-05-01 DIAGNOSIS — I10 Essential (primary) hypertension: Secondary | ICD-10-CM | POA: Insufficient documentation

## 2019-05-01 DIAGNOSIS — R52 Pain, unspecified: Secondary | ICD-10-CM

## 2019-05-01 DIAGNOSIS — Z20828 Contact with and (suspected) exposure to other viral communicable diseases: Secondary | ICD-10-CM | POA: Insufficient documentation

## 2019-05-01 DIAGNOSIS — M791 Myalgia, unspecified site: Secondary | ICD-10-CM | POA: Diagnosis present

## 2019-05-01 LAB — CBC WITH DIFFERENTIAL/PLATELET
Abs Immature Granulocytes: 0.03 10*3/uL (ref 0.00–0.07)
Basophils Absolute: 0 10*3/uL (ref 0.0–0.1)
Basophils Relative: 0 %
Eosinophils Absolute: 0 10*3/uL (ref 0.0–0.5)
Eosinophils Relative: 1 %
HCT: 44.6 % (ref 36.0–46.0)
Hemoglobin: 14.3 g/dL (ref 12.0–15.0)
Immature Granulocytes: 1 %
Lymphocytes Relative: 30 %
Lymphs Abs: 1.8 10*3/uL (ref 0.7–4.0)
MCH: 30.2 pg (ref 26.0–34.0)
MCHC: 32.1 g/dL (ref 30.0–36.0)
MCV: 94.1 fL (ref 80.0–100.0)
Monocytes Absolute: 0.5 10*3/uL (ref 0.1–1.0)
Monocytes Relative: 8 %
Neutro Abs: 3.7 10*3/uL (ref 1.7–7.7)
Neutrophils Relative %: 60 %
Platelets: 273 10*3/uL (ref 150–400)
RBC: 4.74 MIL/uL (ref 3.87–5.11)
RDW: 13.5 % (ref 11.5–15.5)
WBC: 6 10*3/uL (ref 4.0–10.5)
nRBC: 0 % (ref 0.0–0.2)

## 2019-05-01 LAB — COMPREHENSIVE METABOLIC PANEL
ALT: 16 U/L (ref 0–44)
AST: 21 U/L (ref 15–41)
Albumin: 4.6 g/dL (ref 3.5–5.0)
Alkaline Phosphatase: 116 U/L (ref 38–126)
Anion gap: 10 (ref 5–15)
BUN: 13 mg/dL (ref 8–23)
CO2: 28 mmol/L (ref 22–32)
Calcium: 9.9 mg/dL (ref 8.9–10.3)
Chloride: 102 mmol/L (ref 98–111)
Creatinine, Ser: 0.83 mg/dL (ref 0.44–1.00)
GFR calc Af Amer: >60 mL/min (ref >60)
GFR calc non Af Amer: >60 mL/min (ref >60)
Glucose, Bld: 100 mg/dL — ABNORMAL HIGH (ref 70–99)
Potassium: 3.3 mmol/L — ABNORMAL LOW (ref 3.5–5.1)
Sodium: 140 mmol/L (ref 135–145)
Total Bilirubin: 0.6 mg/dL (ref 0.3–1.2)
Total Protein: 8.5 g/dL — ABNORMAL HIGH (ref 6.5–8.1)

## 2019-05-01 LAB — URINALYSIS, ROUTINE W REFLEX MICROSCOPIC
Bilirubin Urine: NEGATIVE
Glucose, UA: NEGATIVE mg/dL
Hgb urine dipstick: NEGATIVE
Ketones, ur: NEGATIVE mg/dL
Leukocytes,Ua: NEGATIVE
Nitrite: NEGATIVE
Protein, ur: NEGATIVE mg/dL
Specific Gravity, Urine: 1.008 (ref 1.005–1.030)
pH: 8 (ref 5.0–8.0)

## 2019-05-01 LAB — MAGNESIUM: Magnesium: 2.2 mg/dL (ref 1.7–2.4)

## 2019-05-01 LAB — LIPASE, BLOOD: Lipase: 25 U/L (ref 11–51)

## 2019-05-01 LAB — SARS CORONAVIRUS 2 BY RT PCR (HOSPITAL ORDER, PERFORMED IN ~~LOC~~ HOSPITAL LAB): SARS Coronavirus 2: NEGATIVE

## 2019-05-01 MED ORDER — DICYCLOMINE HCL 20 MG PO TABS: 20.0000 mg | ORAL_TABLET | Freq: Three times a day (TID) | ORAL | 0 refills | Status: DC | PRN

## 2019-05-01 MED ORDER — POTASSIUM CHLORIDE CRYS ER 20 MEQ PO TBCR
40.0000 meq | EXTENDED_RELEASE_TABLET | Freq: Once | ORAL | Status: AC
  Administered 2019-05-01: 40 meq via ORAL
  Filled 2019-05-01: qty 2

## 2019-05-01 MED ORDER — MORPHINE SULFATE (PF) 4 MG/ML IV SOLN
4.0000 mg | Freq: Once | INTRAVENOUS | Status: AC
  Administered 2019-05-01: 4 mg via INTRAVENOUS
  Filled 2019-05-01: qty 1

## 2019-05-01 MED ORDER — FLUTICASONE PROPIONATE 50 MCG/ACT NA SUSP: 1.0000 | Freq: Every day | NASAL | 0 refills | Status: DC

## 2019-05-01 MED ORDER — BENZONATATE 100 MG PO CAPS: 100.0000 mg | ORAL_CAPSULE | Freq: Three times a day (TID) | ORAL | 0 refills | Status: DC

## 2019-05-01 MED ORDER — IOHEXOL 300 MG/ML  SOLN
100.0000 mL | Freq: Once | INTRAMUSCULAR | Status: AC | PRN
  Administered 2019-05-01: 100 mL via INTRAVENOUS

## 2019-05-01 MED ORDER — ONDANSETRON HCL 4 MG/2ML IJ SOLN
4.0000 mg | Freq: Once | INTRAMUSCULAR | Status: AC
  Administered 2019-05-01: 4 mg via INTRAVENOUS
  Filled 2019-05-01: qty 2

## 2019-05-01 MED ORDER — SODIUM CHLORIDE 0.9 % IV BOLUS
1000.0000 mL | Freq: Once | INTRAVENOUS | Status: AC
  Administered 2019-05-01: 1000 mL via INTRAVENOUS

## 2019-05-01 NOTE — ED Triage Notes (Signed)
PT states she has had loose stools with some abdominal pain x1 week but since yesterday now having a dry cough, body aches and headache. PT states she works at [employer] and unsure if any of her co-workers have covid or not.

## 2019-05-01 NOTE — ED Provider Notes (Signed)
[facility] EMERGENCY DEPARTMENT Provider Note   CSN: [id number] Arrival date & time: 05/01/19  1214     History   Chief Complaint Chief Complaint  Patient presents with   Generalized Body Aches    HPI Krista Finley is a 68 y.o. female with a hx of HTN, hyperlipidemia, & prior CVA who presents to the ED w/ complaints of abdominal pain/diarrhea x 1 week & URI/cough sxs starting today. Patient states she has had 5-6 episodes of non bloody diarrhea per day associated with intermittent abdominal pain. Patient states pain is crampy/aching and located to the lower abdomen, seems worse prior to a BM, no other alleviating/aggravating factors, tried peptobismol w/o relief. She states today she started to feel worse with nasal congestion, frontal headache (gradual onset similar to prior), dry cough, fatigue, & generalized body aches. She attempted to go to work but felt too poorly therefore she came to the ED. Denies fever, N/V, chest pain, dyspnea, melena, hematochezia, syncope, visual disturbance, numbness, or focal weakness. Unknown if she has had direct covid 19 exposure.  Denies recent antibiotics or travel.     HPI  Past Medical History:  Diagnosis Date   CVA (cerebral vascular accident) ([place])    Gastritis    Headache    Hyperlipidemia    Hypertension     Patient Active Problem List   Diagnosis Date Noted   Vitamin D deficiency 09/13/2017   Cerebral infarction due to thrombosis of left middle cerebral artery ([place]) 06/11/2015   Essential hypertension 06/11/2015   HLD (hyperlipidemia) 06/11/2015   History of cerebral infarction 04/25/2015    Past Surgical History:  Procedure Laterality Date   ELBOW SURGERY Right      OB History   No obstetric history on file.      Home Medications    Prior to Admission medications   Medication Sig Start Date End Date Taking? Authorizing Provider  amLODipine (NORVASC) 5 MG tablet Take 1 tablet (5 mg total) by mouth  daily. 03/27/19   Evelina Dun A, FNP  aspirin EC 81 MG tablet Take 81 mg by mouth daily.    [provider]  atorvastatin (LIPITOR) 20 MG tablet TAKE 1 TABLET BY MOUTH ONCE DAILY AT  6PM  (MUST  BE  SEEN) 03/27/19   Evelina Dun A, FNP  metoprolol tartrate (LOPRESSOR) 25 MG tablet Take 1 tablet (25 mg total) by mouth 2 (two) times daily. 03/27/19   Sharion Balloon, FNP    Family History Family History  Problem Relation Age of Onset   Hypertension Mother    Heart disease Mother    Heart disease Father        MI   Stomach cancer Maternal Aunt    Colon cancer Neg Hx     Social History Social History   Tobacco Use   Smoking status: Never Smoker   Smokeless tobacco: Never Used  Substance Use Topics   Alcohol use: Yes    Comment: 1 drink couple times of year   Drug use: No     Allergies   Patient has no known allergies.   Review of Systems Review of Systems  Constitutional: Positive for fatigue. Negative for chills and fever.  HENT: Positive for congestion. Negative for ear pain and sore throat.   Eyes: Negative for visual disturbance.  Respiratory: Positive for cough. Negative for shortness of breath.   Cardiovascular: Negative for chest pain and leg swelling.  Gastrointestinal: Positive for abdominal pain and diarrhea.  Negative for anal bleeding, blood in stool, constipation, nausea and vomiting.  Genitourinary: Negative for dysuria.  Neurological: Positive for headaches. Negative for dizziness, syncope, weakness and numbness.  All other systems reviewed and are negative.    Physical Exam Updated Vital Signs BP 137/83    Pulse 64    Temp 98.3 F (36.8 C) (Oral)    Resp 20    Ht 5\' 3"  (1.6 m)    Wt 68.5 kg    SpO2 100%    BMI 26.75 kg/m   Physical Exam Vitals signs and nursing note reviewed.  Constitutional:      General: She is not in acute distress.    Appearance: She is well-developed.  HENT:     Head: Normocephalic and atraumatic.      Right Ear: Ear canal normal. Tympanic membrane is not perforated, erythematous, retracted or bulging.     Left Ear: Ear canal normal. Tympanic membrane is not perforated, erythematous, retracted or bulging.     Ears:     Comments: No mastoid erythema/swelling/tenderness.     Nose:     Right Sinus: No maxillary sinus tenderness or frontal sinus tenderness.     Left Sinus: No maxillary sinus tenderness or frontal sinus tenderness.     Mouth/Throat:     Pharynx: Uvula midline. No oropharyngeal exudate or posterior oropharyngeal erythema.     Comments: Posterior oropharynx is symmetric appearing. Patient tolerating own secretions without difficulty. No trismus. No drooling. No hot potato voice. No swelling beneath the tongue, submandibular compartment is soft.  Eyes:     General:        Right eye: No discharge.        Left eye: No discharge.     Conjunctiva/sclera: Conjunctivae normal.     Pupils: Pupils are equal, round, and reactive to light.  Neck:     Musculoskeletal: Normal range of motion and neck supple. No edema or neck rigidity.  Cardiovascular:     Rate and Rhythm: Normal rate and regular rhythm.     Heart sounds: No murmur.  Pulmonary:     Effort: Pulmonary effort is normal. No respiratory distress.     Breath sounds: Normal breath sounds. No wheezing, rhonchi or rales.  Abdominal:     General: There is no distension.     Palpations: Abdomen is soft.     Tenderness: There is abdominal tenderness (lower abdomen). There is no right CVA tenderness, left CVA tenderness, guarding or rebound.  Musculoskeletal:     Right lower leg: No edema.     Left lower leg: No edema.  Lymphadenopathy:     Cervical: No cervical adenopathy.  Skin:    General: Skin is warm and dry.     Findings: No rash.  Neurological:     Mental Status: She is alert.     Comments: Alert.  Clear speech.  CN III through XII grossly intact.  Sensation grossly intact bilateral upper and lower extremities.  5 out  of 5 symmetric grip strength.  5 out of 5 strength with plantar dorsiflexion bilaterally.  Normal finger-to-nose.  Negative pronator drift.  Psychiatric:        Behavior: Behavior normal.    ED Treatments / Results  Labs (all labs ordered are listed, but only abnormal results are displayed) Labs Reviewed - No data to display  EKG None  Radiology Ct Abdomen Pelvis W Contrast  Result Date: 05/01/2019 CLINICAL DATA:  Abdominal pain. Loose stool. Body aches, dry cough  and headache. EXAM: CT ABDOMEN AND PELVIS WITH CONTRAST TECHNIQUE: Multidetector CT imaging of the abdomen and pelvis was performed using the standard protocol following bolus administration of intravenous contrast. CONTRAST:  140mL OMNIPAQUE IOHEXOL 300 MG/ML  SOLN COMPARISON:  04/16/2013 FINDINGS: Lower chest: No acute abnormality. Hepatobiliary: 4 mm low-attenuation structure in posteromedial right lobe of liver is too small to characterize. No gallstones or gallbladder wall thickening. Increase caliber of the common bile duct measures 8 mm. Unchanged from previous exam. Pancreas: Unremarkable. No pancreatic ductal dilatation or surrounding inflammatory changes. Spleen: Normal in size without focal abnormality. Adrenals/Urinary Tract: Normal appearance of the adrenal glands. Septae several focal areas of low attenuation are identified within the left kidney measuring less than 7 mm, too small to characterize. No hydronephrosis identified bilaterally. The urinary bladder appears normal. Stomach/Bowel: Stomach is normal. Proximal small bowel loops are unremarkable. There is wall thickening involving the terminal ileum without significant fat stranding, image 32/5. No small or large bowel dilatation. No colonic wall thickening or inflammation. Distal colonic diverticula identified without acute inflammation. Vascular/Lymphatic: Mild aortic atherosclerosis. No abdominopelvic adenopathy identified. Reproductive: Uterine calcifications likely  related to chronic fibroid disease. No adnexal mass. Other: No free fluid or fluid collections. Musculoskeletal: No acute or significant osseous findings. IMPRESSION: 1. Mild wall thickening without surrounding inflammation involving the terminal ileum concerning for inflammatory or infectious terminal ileitis. 2. Chronic increase caliber of the common bile duct. No choledocholithiasis or mass noted. 3.  Aortic Atherosclerosis (ICD10-I70.0). Electronically Signed   By: Kerby Moors M.D.   On: 05/01/2019 15:14   Dg Chest Port 1 View  Result Date: 05/01/2019 CLINICAL DATA:  Diarrhea and abdominal pain for 1 week. Dry cough, body aches and headache beginning yesterday. EXAM: PORTABLE CHEST 1 VIEW COMPARISON:  Single-view of the chest 12/22/2018. PA and lateral chest 03/16/2017. FINDINGS: Lungs are clear. Heart size is normal. No pneumothorax or pleural fluid. No acute or focal bony abnormality. IMPRESSION: Negative chest. Electronically Signed   By: Inge Rise M.D.   On: 05/01/2019 13:17    Procedures Procedures (including critical care time)  Medications Ordered in ED Medications  potassium chloride SA (K-DUR) CR tablet 40 mEq (has no administration in time range)  sodium chloride 0.9 % bolus 1,000 mL (0 mLs Intravenous Stopped 05/01/19 1445)  morphine 4 MG/ML injection 4 mg (4 mg Intravenous Given 05/01/19 1335)  ondansetron (ZOFRAN) injection 4 mg (4 mg Intravenous Given 05/01/19 1333)  iohexol (OMNIPAQUE) 300 MG/ML solution 100 mL (100 mLs Intravenous Contrast Given 05/01/19 1439)     Initial Impression / Assessment and Plan / ED Course  I have reviewed the triage vital signs and the nursing notes.  Pertinent labs & imaging results that were available during my care of the patient were reviewed by me and considered in my medical decision making (see chart for details).   Patient presents to the ED with complaints of GI sxs x 1 week & URI/cough/generalized body aches today. Nontoxic  appearing, resting comfortably- BP elevated on arrival.   Plan for labs, CXR, CT A/P & symptomatic management.   Work-up reviewed: CBC: No leukocytosis, leukopenia, or anemia CMP: Mild hypokalemia-we will orally replace, add on Mg WNL. No renal or LFT abnormalities.  Lipase: WNL UA: No UTI, hematuria, or significant dehydration.  COVID 19 testing: Negative CXR: Negative CT A/P: 1. Mild wall thickening without surrounding inflammation involving the terminal ileum concerning for inflammatory or infectious terminal ileitis. 2. Chronic increase caliber of the common  bile duct. No choledocholithiasis or mass noted. 3.  Aortic Atherosclerosis  Headache with gradual onset, steady progression, similar to prior, normal neurologic exam, BP normalized in the ER, no meningismus, doubt head bleed, ischemic CVA, or meningitis.  No sinus tenderness, afebrile, doubt acute bacterial sinusitis.  TMs/oropharynx are clear.  Chest x-ray negative for infiltrate, doubt pneumonia.  No signs of respiratory distress or hypoxia.  CT abdomen pelvis with contrast with findings consistent with inflammatory/infectious terminal ileitis-suspect likely viral, discussed with/benefit of antibiotics, patient would prefer to hold off with time and agreement with.  Patient feeling improved, BP improved, will discharge home with symptomatic care.  PCP follow-up. I discussed results, treatment plan, need for follow-up, and return precautions with the patient. Provided opportunity for questions, patient confirmed understanding and is in agreement with plan.    Findings and plan of care discussed with supervising physician Dr. Roderic Palau who is in agreement.   JONATHAN DANGEL was evaluated in Emergency Department on 05/01/2019 for the symptoms described in the history of present illness. He/she was evaluated in the context of the global COVID-19 pandemic, which necessitated consideration that the patient might be at risk for infection with the  SARS-CoV-2 virus that causes COVID-19. Institutional protocols and algorithms that pertain to the evaluation of patients at risk for COVID-19 are in a state of rapid change based on information released by regulatory bodies including the CDC and federal and state organizations. These policies and algorithms were followed during the patient's care in the ED.     Final Clinical Impressions(s) / ED Diagnoses   Final diagnoses:  Abdominal pain, unspecified abdominal location  Generalized body aches    ED Discharge Orders         Ordered    fluticasone (FLONASE) 50 MCG/ACT nasal spray  Daily     05/01/19 1608    dicyclomine (BENTYL) 20 MG tablet  Every 8 hours PRN     05/01/19 1608    benzonatate (TESSALON) 100 MG capsule  Every 8 hours     05/01/19 1608           Addisen Chappelle, Chapin R, PA-C 05/01/19 1611    Milton Ferguson, MD 05/05/19 1051

## 2019-05-01 NOTE — Discharge Instructions (Addendum)
You were seen in the emergency department today for abdominal pain, diarrhea, generalized body aches, and cough.  Your chest x-ray was normal.  Your CT scan showed findings consistent with ileitis (inflammation/infection of the small bowel).  Your labs show that your potassium was slightly low, we gave you oral potassium in the ER, please follow attached diet guidelines to help supplement potassium as well as to help stop diarrhea.  Your COVID test was negative.  We suspect your symptoms are viral in nature.  We are sending you home with Flonase to help with congestion, Tessalon that with cough, and Bentyl to help with abdominal cramping.  Please take these as prescribed.   We have prescribed you new medication(s) today. Discuss the medications prescribed today with your pharmacist as they can have adverse effects and interactions with your other medicines including over the counter and prescribed medications. Seek medical evaluation if you start to experience new or abnormal symptoms after taking one of these medicines, seek care immediately if you start to experience difficulty breathing, feeling of your throat closing, facial swelling, or rash as these could be indications of a more serious allergic reaction  Please take Tylenol/Motrin per over-the-counter dosing to help with discomfort.  Please follow-up with your primary care provider within 3 days.  Return to the ER for new or worsening symptoms including but not limited to worsening pain, inability to keep fluids down, blood in your stool, trouble breathing, or any other concerns.

## 2019-05-02 ENCOUNTER — Telehealth: Payer: Self-pay | Admitting: Family

## 2019-05-02 NOTE — Telephone Encounter (Signed)
Pt called = AP - hosp follow up: She is having abd pain and cough - was given meds at ER [place] yesterday = picking them up at pharm as we were talking. Aware we can not see her in office with these s/s. She will see if the meds help and call back for further guidance if no better.

## 2019-05-04 ENCOUNTER — Ambulatory Visit (INDEPENDENT_AMBULATORY_CARE_PROVIDER_SITE_OTHER): Payer: BC Managed Care – PPO | Admitting: Physician Assistant

## 2019-05-04 ENCOUNTER — Telehealth: Payer: Self-pay | Admitting: Internal Medicine

## 2019-05-04 ENCOUNTER — Other Ambulatory Visit: Payer: Self-pay

## 2019-05-04 ENCOUNTER — Encounter: Payer: Self-pay | Admitting: Physician Assistant

## 2019-05-04 DIAGNOSIS — R197 Diarrhea, unspecified: Secondary | ICD-10-CM

## 2019-05-04 DIAGNOSIS — K921 Melena: Secondary | ICD-10-CM | POA: Diagnosis not present

## 2019-05-04 DIAGNOSIS — R1031 Right lower quadrant pain: Secondary | ICD-10-CM

## 2019-05-04 DIAGNOSIS — R531 Weakness: Secondary | ICD-10-CM

## 2019-05-04 NOTE — Progress Notes (Addendum)
Telephone visit  Subjective: Krista Finley pain, black stools, diarrhea PCP: Sharion Balloon, FNP [id number] Krista Finley is a 68 y.o. female calls for telephone consult today. Patient provides verbal consent for consult held via phone.  Patient is identified with 2 separate identifiers. At this time the entire area is on COVID-19 social distancing and stay home orders are in place.  Patient is of higher risk and therefore we are performing this by a virtual method.  Location of patient: Home Location of provider: [facility] Others present for call: No  There were no vitals taken for this visit.   This patient was seen through the emergency department on 05/01/2019.  She had 2 weeks of diarrhea.  And the stools have become black.  She was at the hospital and tested negative for COVID.  All of her labs were fairly stable except potassium slightly low.  She has never had that problem in the past.  The CT did show an the ileum inflammation that could involve inflammatory versus infectious ileitis.  Since she has been home she is continued with abdominal pain.  It got worse this morning.  The stool is still black.  Emergency room records and labs are reviewed.  Also the CT was reviewed.  Past Medical History:  Diagnosis Date  . CVA (cerebral vascular accident) ([place])   . Gastritis   . Headache   . Hyperlipidemia   . Hypertension    Relevant past medical, surgical, family and social history reviewed and updated as indicated. Interim medical history since our last visit reviewed. Allergies and medications reviewed and updated. DATA REVIEWED: CHART IN EPIC  Family History reviewed for pertinent findings.  Review of Systems  Constitutional: Positive for fatigue. Negative for fever and unexpected weight change.  HENT: Negative.   Eyes: Negative.   Respiratory: Negative.   Gastrointestinal: Positive for abdominal distention, abdominal pain and diarrhea. Negative for blood in stool.   Genitourinary: Negative.     Allergies as of 05/04/2019   No Known Allergies     Medication List       Accurate as of May 04, 2019 11:15 AM. If you have any questions, ask your nurse or doctor.        amLODipine 5 MG tablet Commonly known as: NORVASC Take 1 tablet (5 mg total) by mouth daily.   aspirin EC 81 MG tablet Take 81 mg by mouth daily.   atorvastatin 20 MG tablet Commonly known as: LIPITOR TAKE 1 TABLET BY MOUTH ONCE DAILY AT  6PM  (MUST  BE  SEEN)   benzonatate 100 MG capsule Commonly known as: TESSALON Take 1 capsule (100 mg total) by mouth every 8 (eight) hours.   dicyclomine 20 MG tablet Commonly known as: BENTYL Take 1 tablet (20 mg total) by mouth every 8 (eight) hours as needed for spasms.   fluticasone 50 MCG/ACT nasal spray Commonly known as: FLONASE Place 1 spray into both nostrils daily.   meloxicam 7.5 MG tablet Commonly known as: MOBIC Take 1 tablet by mouth 2 (two) times daily as needed.   metoprolol tartrate 25 MG tablet Commonly known as: LOPRESSOR Take 1 tablet (25 mg total) by mouth 2 (two) times daily.          Objective:    There were no vitals taken for this visit.  No Known Allergies  Wt Readings from Last 3 Encounters:  05/01/19 151 lb (68.5 kg)  03/10/19 150 lb (68 kg)  02/16/19 152 lb  12.8 oz (69.3 kg)    Physical Exam  Results for orders placed or performed during the hospital encounter of 05/01/19  SARS Coronavirus 2 [facility] order, Performed in [facility] hospital lab) Nasopharyngeal Nasopharyngeal Swab   Specimen: Nasopharyngeal Swab  Result Value Ref Range   SARS Coronavirus 2 NEGATIVE NEGATIVE  Comprehensive metabolic panel  Result Value Ref Range   Sodium 140 135 - 145 mmol/L   Potassium 3.3 (L) 3.5 - 5.1 mmol/L   Chloride 102 98 - 111 mmol/L   CO2 28 22 - 32 mmol/L   Glucose, Bld 100 (H) 70 - 99 mg/dL   BUN 13 8 - 23 mg/dL   Creatinine, Ser 0.83 0.44 - 1.00 mg/dL   Calcium 9.9 8.9 - 10.3 mg/dL    Total Protein 8.5 (H) 6.5 - 8.1 g/dL   Albumin 4.6 3.5 - 5.0 g/dL   AST 21 15 - 41 U/L   ALT 16 0 - 44 U/L   Alkaline Phosphatase 116 38 - 126 U/L   Total Bilirubin 0.6 0.3 - 1.2 mg/dL   GFR calc non Af Amer >60 >60 mL/min   GFR calc Af Amer >60 >60 mL/min   Anion gap 10 5 - 15  Lipase, blood  Result Value Ref Range   Lipase 25 11 - 51 U/L  CBC with Differential  Result Value Ref Range   WBC 6.0 4.0 - 10.5 K/uL   RBC 4.74 3.87 - 5.11 MIL/uL   Hemoglobin 14.3 12.0 - 15.0 g/dL   HCT 44.6 36.0 - 46.0 %   MCV 94.1 80.0 - 100.0 fL   MCH 30.2 26.0 - 34.0 pg   MCHC 32.1 30.0 - 36.0 g/dL   RDW 13.5 11.5 - 15.5 %   Platelets 273 150 - 400 K/uL   nRBC 0.0 0.0 - 0.2 %   Neutrophils Relative % 60 %   Neutro Abs 3.7 1.7 - 7.7 K/uL   Lymphocytes Relative 30 %   Lymphs Abs 1.8 0.7 - 4.0 K/uL   Monocytes Relative 8 %   Monocytes Absolute 0.5 0.1 - 1.0 K/uL   Eosinophils Relative 1 %   Eosinophils Absolute 0.0 0.0 - 0.5 K/uL   Basophils Relative 0 %   Basophils Absolute 0.0 0.0 - 0.1 K/uL   Immature Granulocytes 1 %   Abs Immature Granulocytes 0.03 0.00 - 0.07 K/uL  Urinalysis, Routine w reflex microscopic  Result Value Ref Range   Color, Urine COLORLESS (A) YELLOW   APPearance CLEAR CLEAR   Specific Gravity, Urine 1.008 1.005 - 1.030   pH 8.0 5.0 - 8.0   Glucose, UA NEGATIVE NEGATIVE mg/dL   Hgb urine dipstick NEGATIVE NEGATIVE   Bilirubin Urine NEGATIVE NEGATIVE   Ketones, ur NEGATIVE NEGATIVE mg/dL   Protein, ur NEGATIVE NEGATIVE mg/dL   Nitrite NEGATIVE NEGATIVE   Leukocytes,Ua NEGATIVE NEGATIVE  Magnesium  Result Value Ref Range   Magnesium 2.2 1.7 - 2.4 mg/dL      Assessment & Plan:   1. Black stool - Ambulatory referral to Gastroenterology  2. Diarrhea, unspecified type - Ambulatory referral to Gastroenterology  3. Right lower quadrant abdominal pain - Ambulatory referral to Gastroenterology  4. Weakness - Ambulatory referral to Gastroenterology   Patient  can continue the Bentyl that was given to her through the emergency department.  I have encouraged her to continue with a bland diet as much as possible.  I encouraged her to increase her potassium through baked potato and molasses.  Patient is given warning that if anything suddenly changes or gets very worse with her abdominal pain to go back to the emergency department.  Continue all other maintenance medications as listed above.  Follow up plan: No follow-ups on file.  Educational handout given for   Terald Sleeper PA-C [facility] [address]  [place], [place] [address] [phone]   Start of visit: 11:15 AM End os visit: 11:30 AM

## 2019-05-04 NOTE — Telephone Encounter (Signed)
Pt scheduled to see Alonza Bogus PA 05/10/19@2 :30pm. [facility] to notify pt of appt.

## 2019-05-05 ENCOUNTER — Telehealth: Payer: Self-pay | Admitting: Family

## 2019-05-05 ENCOUNTER — Encounter: Payer: Self-pay | Admitting: *Deleted

## 2019-05-05 NOTE — Telephone Encounter (Signed)
Done, pt aware.

## 2019-05-05 NOTE — Telephone Encounter (Signed)
That is okay.

## 2019-05-06 ENCOUNTER — Encounter (HOSPITAL_COMMUNITY): Payer: Self-pay | Admitting: Emergency Medicine

## 2019-05-06 ENCOUNTER — Emergency Department (HOSPITAL_COMMUNITY)
Admission: EM | Admit: 2019-05-06 | Discharge: 2019-05-06 | Disposition: A | Discharge status: Home / Self Care | Payer: BC Managed Care – PPO | Attending: Emergency Medicine | Admitting: Emergency Medicine

## 2019-05-06 ENCOUNTER — Other Ambulatory Visit: Payer: Self-pay

## 2019-05-06 DIAGNOSIS — Z7982 Long term (current) use of aspirin: Secondary | ICD-10-CM | POA: Diagnosis not present

## 2019-05-06 DIAGNOSIS — R109 Unspecified abdominal pain: Secondary | ICD-10-CM | POA: Diagnosis present

## 2019-05-06 DIAGNOSIS — I10 Essential (primary) hypertension: Secondary | ICD-10-CM | POA: Insufficient documentation

## 2019-05-06 DIAGNOSIS — Z79899 Other long term (current) drug therapy: Secondary | ICD-10-CM | POA: Diagnosis not present

## 2019-05-06 DIAGNOSIS — K529 Noninfective gastroenteritis and colitis, unspecified: Secondary | ICD-10-CM

## 2019-05-06 LAB — CBC
HCT: 40.6 % (ref 36.0–46.0)
Hemoglobin: 12.9 g/dL (ref 12.0–15.0)
MCH: 30.2 pg (ref 26.0–34.0)
MCHC: 31.8 g/dL (ref 30.0–36.0)
MCV: 95.1 fL (ref 80.0–100.0)
Platelets: 262 10*3/uL (ref 150–400)
RBC: 4.27 MIL/uL (ref 3.87–5.11)
RDW: 13.7 % (ref 11.5–15.5)
WBC: 4.8 10*3/uL (ref 4.0–10.5)
nRBC: 0 % (ref 0.0–0.2)

## 2019-05-06 LAB — URINALYSIS, ROUTINE W REFLEX MICROSCOPIC
Bilirubin Urine: NEGATIVE
Glucose, UA: NEGATIVE mg/dL
Hgb urine dipstick: NEGATIVE
Ketones, ur: NEGATIVE mg/dL
Leukocytes,Ua: NEGATIVE
Nitrite: NEGATIVE
Protein, ur: NEGATIVE mg/dL
Specific Gravity, Urine: 1.008 (ref 1.005–1.030)
pH: 7 (ref 5.0–8.0)

## 2019-05-06 LAB — COMPREHENSIVE METABOLIC PANEL
ALT: 13 U/L (ref 0–44)
AST: 17 U/L (ref 15–41)
Albumin: 4 g/dL (ref 3.5–5.0)
Alkaline Phosphatase: 99 U/L (ref 38–126)
Anion gap: 8 (ref 5–15)
BUN: 14 mg/dL (ref 8–23)
CO2: 27 mmol/L (ref 22–32)
Calcium: 8.9 mg/dL (ref 8.9–10.3)
Chloride: 106 mmol/L (ref 98–111)
Creatinine, Ser: 0.83 mg/dL (ref 0.44–1.00)
GFR calc Af Amer: >60 mL/min (ref >60)
GFR calc non Af Amer: >60 mL/min (ref >60)
Glucose, Bld: 94 mg/dL (ref 70–99)
Potassium: 3.5 mmol/L (ref 3.5–5.1)
Sodium: 141 mmol/L (ref 135–145)
Total Bilirubin: 0.5 mg/dL (ref 0.3–1.2)
Total Protein: 7.3 g/dL (ref 6.5–8.1)

## 2019-05-06 LAB — POC OCCULT BLOOD, ED: Fecal Occult Bld: NEGATIVE

## 2019-05-06 LAB — TYPE AND SCREEN
ABO/RH(D): O POS
Antibody Screen: NEGATIVE

## 2019-05-06 LAB — LIPASE, BLOOD: Lipase: 25 U/L (ref 11–51)

## 2019-05-06 MED ORDER — AMOXICILLIN-POT CLAVULANATE 875-125 MG PO TABS: 1.0000 | ORAL_TABLET | Freq: Two times a day (BID) | ORAL | 0 refills | Status: DC

## 2019-05-06 NOTE — ED Notes (Signed)
Lab at bedside collecting blood sample. Unsuccessful IV attempt x2 by this RN.

## 2019-05-06 NOTE — ED Triage Notes (Signed)
Pt c/o lower abdominal pressure without n/v/d and fever. Pt also c/o black hard stools. States she was evaluated in ED on Monday and was told that she had an infection in her small intestine. Denies urinary symptoms. Pt not on iron supplements. Pt has appointment with GI on Wednesday.

## 2019-05-06 NOTE — ED Provider Notes (Signed)
[facility] EMERGENCY DEPARTMENT Provider Note   CSN: [id number] Arrival date & time: 05/06/19  [id number]     History   Chief Complaint Chief Complaint  Patient presents with  . Abdominal Pain    HPI RAAYA FILTER is a 68 y.o. female with past medical history of hypertension, CVA, gastritis, presenting to the emergency department with complaint of persistent lower abdominal pressure.  She was evaluated on 05/01/2019 in the ED for similar complaints.  She had a CT scan of her abdomen which showed wall thickening of the ileum concerning for inflammatory versus infectious terminal ileitis.  It appears patient declined antibiotic management at that time and has been treating symptomatically.  She states she had a phone visit with her PCP who has referred her to GI, she has an appointment this Wednesday.  She states she has been having multiple weeks of black stools, that are mostly hard.  She denies any associated fever, nausea, vomiting, urinary symptoms.  Her COVID test was negative during recent visit.  She was also complaining of URI symptoms though she states those have resolved.     The history is provided by the patient and medical records.    Past Medical History:  Diagnosis Date  . CVA (cerebral vascular accident) ([place])   . Gastritis   . Headache   . Hyperlipidemia   . Hypertension     Patient Active Problem List   Diagnosis Date Noted  . Vitamin D deficiency 09/13/2017  . Cerebral infarction due to thrombosis of left middle cerebral artery ([place]) 06/11/2015  . Essential hypertension 06/11/2015  . HLD (hyperlipidemia) 06/11/2015  . History of cerebral infarction 04/25/2015    Past Surgical History:  Procedure Laterality Date  . ELBOW SURGERY Right      OB History    Gravida      Para      Term      Preterm      AB      Living  1     SAB      TAB      Ectopic      Multiple      Live Births               Home Medications    Prior to  Admission medications   Medication Sig Start Date End Date Taking? Authorizing Provider  amLODipine (NORVASC) 5 MG tablet Take 1 tablet (5 mg total) by mouth daily. 03/27/19   Evelina Dun A, FNP  amoxicillin-clavulanate (AUGMENTIN) 875-125 MG tablet Take 1 tablet by mouth every 12 (twelve) hours. 05/06/19   Robinson, [place] N, PA-C  aspirin EC 81 MG tablet Take 81 mg by mouth daily.    [provider]  atorvastatin (LIPITOR) 20 MG tablet TAKE 1 TABLET BY MOUTH ONCE DAILY AT  6PM  (MUST  BE  SEEN) 03/27/19   Evelina Dun A, FNP  benzonatate (TESSALON) 100 MG capsule Take 1 capsule (100 mg total) by mouth every 8 (eight) hours. 05/01/19   Petrucelli, Samantha R, PA-C  dicyclomine (BENTYL) 20 MG tablet Take 1 tablet (20 mg total) by mouth every 8 (eight) hours as needed for spasms. 05/01/19   Petrucelli, Samantha R, PA-C  fluticasone (FLONASE) 50 MCG/ACT nasal spray Place 1 spray into both nostrils daily. 05/01/19   Petrucelli, Samantha R, PA-C  meloxicam (MOBIC) 7.5 MG tablet Take 1 tablet by mouth 2 (two) times daily as needed. 04/24/19   [provider]  metoprolol  tartrate (LOPRESSOR) 25 MG tablet Take 1 tablet (25 mg total) by mouth 2 (two) times daily. 03/27/19   Sharion Balloon, FNP    Family History Family History  Problem Relation Age of Onset  . Hypertension Mother   . Heart disease Mother   . Heart disease Father        MI  . Stomach cancer Maternal Aunt   . Colon cancer Neg Hx     Social History Social History   Tobacco Use  . Smoking status: Never Smoker  . Smokeless tobacco: Never Used  Substance Use Topics  . Alcohol use: Yes    Comment: 1 drink couple times of year  . Drug use: No     Allergies   Patient has no known allergies.   Review of Systems Review of Systems  Constitutional: Negative for chills and fever.  Gastrointestinal: Positive for abdominal pain and blood in stool. Negative for nausea and vomiting.  Genitourinary: Negative for dysuria  and frequency.  All other systems reviewed and are negative.    Physical Exam Updated Vital Signs BP (!) 142/81   Pulse 72   Temp 99.4 F (37.4 C) (Oral)   Resp 16   Ht 5\' 4"  (1.626 m)   Wt 68.5 kg   SpO2 98%   BMI 25.92 kg/m   Physical Exam Vitals signs and nursing note reviewed.  Constitutional:      General: She is not in acute distress.    Appearance: She is well-developed. She is not ill-appearing.  HENT:     Head: Normocephalic and atraumatic.  Eyes:     Conjunctiva/sclera: Conjunctivae normal.  Cardiovascular:     Rate and Rhythm: Normal rate and regular rhythm.  Pulmonary:     Effort: Pulmonary effort is normal. No respiratory distress.     Breath sounds: Normal breath sounds.  Abdominal:     General: Bowel sounds are normal.     Palpations: Abdomen is soft.     Tenderness: There is abdominal tenderness. There is no guarding or rebound.     Comments: Generalized tenderness to the lower abdomen.  Genitourinary:    Comments: Normal rectal exam, dark brown stool.  No gross melena or blood.  Hemoccult is negative Skin:    General: Skin is warm.  Neurological:     Mental Status: She is alert.  Psychiatric:        Behavior: Behavior normal.      ED Treatments / Results  Labs (all labs ordered are listed, but only abnormal results are displayed) Labs Reviewed  URINALYSIS, ROUTINE W REFLEX MICROSCOPIC - Abnormal; Notable for the following components:      Result Value   Color, Urine STRAW (*)    All other components within normal limits  COMPREHENSIVE METABOLIC PANEL  CBC  LIPASE, BLOOD  POC OCCULT BLOOD, ED  TYPE AND SCREEN    EKG None  Radiology No results found.  Procedures Procedures (including critical care time)  Medications Ordered in ED Medications - No data to display   Initial Impression / Assessment and Plan / ED Course  I have reviewed the triage vital signs and the nursing notes.  Pertinent labs & imaging results that were  available during my care of the patient were reviewed by me and considered in my medical decision making (see chart for details).        Patient presenting to the ED with persistent abdominal cramping after evaluation on 05/01/2019.  CT scan was  done consistent with terminal ileitis.  Shared decision making was done with the patient, I am patient was discharged without antibiotics.  Her URI symptoms have resolved, her cover test was negative.  Her presentation today is overall reassuring.  Vital signs are stable, afebrile.  Abdomen with some mild generalized lower tenderness, no guarding or rebound.  Labs appear relatively unchanged from last ED visit.  Hemoccult is negative.  She is well-appearing and in no distress.  Will discharge with antibiotics for enteritis.  Patient states she has a GI appointment this Wednesday, encouraged she attend.  PCP follow-up in the interim as needed.  Strict return precautions.  Patient agreeable plan and safe for discharge.  Discussed results, findings, treatment and follow up. Patient advised of return precautions. Patient verbalized understanding and agreed with plan.   Final Clinical Impressions(s) / ED Diagnoses   Final diagnoses:  Enteritis    ED Discharge Orders         Ordered    amoxicillin-clavulanate (AUGMENTIN) 875-125 MG tablet  Every 12 hours     05/06/19 1323           Robinson, [place] N, PA-C 05/06/19 [place], [place], DO 05/09/19 1102

## 2019-05-06 NOTE — Discharge Instructions (Signed)
Take your antibiotics as prescribed. You can try a clear liquid diet for a couple of days to give your bowels rest. This may help you abdominal cramping. Attend your upcoming GI appointment. Return to the ER if you develop fever, or new or worsening symptoms.

## 2019-05-10 ENCOUNTER — Other Ambulatory Visit (INDEPENDENT_AMBULATORY_CARE_PROVIDER_SITE_OTHER): Payer: BC Managed Care – PPO

## 2019-05-10 ENCOUNTER — Ambulatory Visit: Payer: BC Managed Care – PPO | Admitting: Gastroenterology

## 2019-05-10 ENCOUNTER — Encounter: Payer: Self-pay | Admitting: Gastroenterology

## 2019-05-10 ENCOUNTER — Encounter: Payer: Self-pay | Admitting: Internal Medicine

## 2019-05-10 ENCOUNTER — Other Ambulatory Visit: Payer: Self-pay

## 2019-05-10 VITALS — BP 140/74 | HR 60 | Temp 98.4°F | Ht 64.0 in | Wt 152.6 lb

## 2019-05-10 DIAGNOSIS — R1031 Right lower quadrant pain: Secondary | ICD-10-CM

## 2019-05-10 DIAGNOSIS — R9389 Abnormal findings on diagnostic imaging of other specified body structures: Secondary | ICD-10-CM | POA: Diagnosis not present

## 2019-05-10 LAB — SEDIMENTATION RATE: Sed Rate: 10 mm/hr (ref 0–30)

## 2019-05-10 LAB — C-REACTIVE PROTEIN: CRP: 1.8 mg/dL (ref 0.5–20.0)

## 2019-05-10 MED ORDER — DICYCLOMINE HCL 20 MG PO TABS: 20.0000 mg | ORAL_TABLET | Freq: Three times a day (TID) | ORAL | 1 refills | Status: DC | PRN

## 2019-05-10 NOTE — Patient Instructions (Signed)
Your provider has requested that you go to the basement level for lab work before leaving today. Press "B" on the elevator. The lab is located at the first door on the left as you exit the elevator.  We have sent the following medications to your pharmacy for you to pick up at your convenience: bentyl.  You have been scheduled for a colonoscopy. Please follow written instructions given to you at your visit today.  Please pick up your prep supplies at the pharmacy within the next 1-3 days. If you use inhalers (even only as needed), please bring them with you on the day of your procedure.

## 2019-05-10 NOTE — Progress Notes (Signed)
05/10/2019 NYSA BROSZ [id number] 05/22/1951   HISTORY OF PRESENT ILLNESS: This is a 69 year old female who is a patient of Dr. Vena Rua.  She was last seen here in 2014.  Had colonoscopy in November 2014 at which time she had diverticulosis only.  She is here today with complaints of lower abdominal pain, particularly on the right side that she describes as cramping that she says started 2 or 3 weeks ago.  She is also complaining of diarrhea and dark stools since then as well.  Stools were Hemoccult negative in the ER.  CBC, CMP, and lipase were normal.  CT scan of the abdomen pelvis with contrast was performed and showed mild wall thickening without surrounding inflammation involving the terminal ileum concerning for infectious or inflammatory terminal ileitis.  They placed her on a 7-day course of Augmentin twice daily, which she has been taking for the past 4 days with so far no improvement in her symptoms.  They also gave her some dicyclomine to use as needed, which she has been using sparingly and only has a couple of pills left.  Interestingly, one of the indications for colonoscopy in 2014 was screening purposes, but also complaints of right lower quadrant abdominal pain.  Referred here this time by Particia Nearing, PA-C.  Past Medical History:  Diagnosis Date  . CVA (cerebral vascular accident) ([place])   . Gastritis   . Headache   . Hyperlipidemia   . Hypertension    Past Surgical History:  Procedure Laterality Date  . ELBOW SURGERY Right     reports that she has never smoked. She has never used smokeless tobacco. She reports current alcohol use. She reports that she does not use drugs. family history includes Heart disease in her father and mother; Hypertension in her mother; Stomach cancer in her maternal aunt. No Known Allergies    Outpatient Encounter Medications as of 05/10/2019  Medication Sig  . amLODipine (NORVASC) 5 MG tablet Take 1 tablet (5 mg total) by mouth  daily.  Marland Kitchen amoxicillin-clavulanate (AUGMENTIN) 875-125 MG tablet Take 1 tablet by mouth every 12 (twelve) hours.  Marland Kitchen aspirin EC 81 MG tablet Take 81 mg by mouth daily.  Marland Kitchen atorvastatin (LIPITOR) 20 MG tablet TAKE 1 TABLET BY MOUTH ONCE DAILY AT  6PM  (MUST  BE  SEEN)  . fluticasone (FLONASE) 50 MCG/ACT nasal spray Place 1 spray into both nostrils daily.  . meloxicam (MOBIC) 7.5 MG tablet Take 1 tablet by mouth 2 (two) times daily as needed.  . metoprolol tartrate (LOPRESSOR) 25 MG tablet Take 1 tablet (25 mg total) by mouth 2 (two) times daily.  . benzonatate (TESSALON) 100 MG capsule Take 1 capsule (100 mg total) by mouth every 8 (eight) hours. (Patient not taking: Reported on 05/10/2019)  . dicyclomine (BENTYL) 20 MG tablet Take 1 tablet (20 mg total) by mouth every 8 (eight) hours as needed for spasms. (Patient not taking: Reported on 05/10/2019)   No facility-administered encounter medications on file as of 05/10/2019.      REVIEW OF SYSTEMS  : All other systems reviewed and negative except where noted in the History of Present Illness.   PHYSICAL EXAM: BP 140/74   Pulse 60   Temp 98.4 F (36.9 C) (Oral)   Ht 5\' 4"  (1.626 m)   Wt 152 lb 9.6 oz (69.2 kg)   BMI 26.19 kg/m  General: Well developed black female in no acute distress Head: Normocephalic and atraumatic Eyes:  Sclerae  anicteric, conjunctiva pink. Ears: Normal auditory acuity Lungs: Clear throughout to auscultation; no increased WOB. Heart: Regular rate and rhythm; no M/R/G. Abdomen: Soft, non-distended.  BS present.  Non-tender. Rectal:  No external hemorrhoids noted.  DRE did not reveal any masses.  Brown stool noted on exam glove that were heme negative. Musculoskeletal: Symmetrical with no gross deformities  Skin: No lesions on visible extremities Extremities: No edema  Neurological: Alert oriented x 4, grossly non-focal Psychological:  Alert and cooperative. Normal mood and affect  ASSESSMENT AND PLAN: *68 year old  female with complaints of right lower quadrant abdominal pain and dark stools.  She has had Hemoccult negative stools both in the ER 4 days ago and then here again today.  CT scan in the ER showed mild wall thickening involving the terminal ileum concerning for inflammatory vs infectious terminal ileitis.  She was placed on 7 days of Augmentin.  This certainly could be infectious in etiology as she had rather sudden onset of the symptoms, but interestingly she had complaints of right lower quadrant abdominal pain in 2014 when she had her colonoscopy at that time as well.  Had only diverticulosis at that time.  I am going to schedule her for repeat colonoscopy with ileoscopy to evaluate this terminal ileitis.  She will finish her course of Augmentin.  She was given Bentyl for abdominal spasm/cramping, which she has used sparingly so I will send a new prescription for that for her to continue using as well.  We will check a sed rate and CRP today.  **Colonoscopy was scheduled with Dr. Carlean Purl instead of with Dr. Hilarie Fredrickson.  Apparently the patient is trying to retire within the next week and will have changes in her insurance so she would like to try to get this procedure done sooner rather than later.  CC:  Particia Nearing, PA-C

## 2019-05-12 ENCOUNTER — Telehealth: Payer: Self-pay | Admitting: Internal Medicine

## 2019-05-12 NOTE — Telephone Encounter (Signed)

## 2019-05-15 ENCOUNTER — Encounter: Payer: Self-pay | Admitting: Internal Medicine

## 2019-05-15 ENCOUNTER — Ambulatory Visit (AMBULATORY_SURGERY_CENTER): Payer: BC Managed Care – PPO | Admitting: Internal Medicine

## 2019-05-15 ENCOUNTER — Other Ambulatory Visit: Payer: Self-pay

## 2019-05-15 ENCOUNTER — Other Ambulatory Visit: Payer: Self-pay | Admitting: Internal Medicine

## 2019-05-15 ENCOUNTER — Encounter: Payer: BC Managed Care – PPO | Admitting: Gastroenterology

## 2019-05-15 VITALS — BP 160/84 | HR 70 | Temp 98.8°F | Resp 15 | Ht 64.0 in | Wt 152.0 lb

## 2019-05-15 DIAGNOSIS — K5730 Diverticulosis of large intestine without perforation or abscess without bleeding: Secondary | ICD-10-CM | POA: Diagnosis not present

## 2019-05-15 DIAGNOSIS — K529 Noninfective gastroenteritis and colitis, unspecified: Secondary | ICD-10-CM | POA: Diagnosis not present

## 2019-05-15 DIAGNOSIS — R1031 Right lower quadrant pain: Secondary | ICD-10-CM

## 2019-05-15 DIAGNOSIS — K633 Ulcer of intestine: Secondary | ICD-10-CM

## 2019-05-15 DIAGNOSIS — K648 Other hemorrhoids: Secondary | ICD-10-CM | POA: Diagnosis not present

## 2019-05-15 DIAGNOSIS — K598 Other specified functional intestinal disorders: Secondary | ICD-10-CM | POA: Diagnosis not present

## 2019-05-15 MED ORDER — SODIUM CHLORIDE 0.9 % IV SOLN: 500.0000 mL | Freq: Once | INTRAVENOUS | Status: DC

## 2019-05-15 NOTE — Progress Notes (Signed)
Pt's states no medical or surgical changes since previsit or office visit.  Temp KA VS CW 

## 2019-05-15 NOTE — Progress Notes (Signed)
Called to room to assist during endoscopic procedure.  Patient ID and intended procedure confirmed with present staff. Received instructions for my participation in the procedure from the performing physician.  

## 2019-05-15 NOTE — Progress Notes (Signed)
To PACU, VSS. Report to [email or web address] 

## 2019-05-15 NOTE — Op Note (Addendum)
[place] Patient Name: Krista Finley Procedure Date: 05/15/2019 3:43 PM MRN: [id number] Endoscopist: Gatha Mayer , MD Age: 68 Referring MD:  Date of Birth: 07/09/1951 Gender: Female Account #: [id number] Procedure:                Colonoscopy Indications:              Abdominal pain in the right lower quadrant,                            Abnormal CT of the GI tract thickened terminal ileum Medicines:                Propofol per Anesthesia, Monitored Anesthesia Care Procedure:                Pre-Anesthesia Assessment:                           - Prior to the procedure, a History and Physical                            was performed, and patient medications and                            allergies were reviewed. The patient's tolerance of                            previous anesthesia was also reviewed. The risks                            and benefits of the procedure and the sedation                            options and risks were discussed with the patient.                            All questions were answered, and informed consent                            was obtained. Prior Anticoagulants: The patient has                            taken no previous anticoagulant or antiplatelet                            agents. ASA Grade Assessment: III - A patient with                            severe systemic disease. After reviewing the risks                            and benefits, the patient was deemed in                            satisfactory condition to undergo the procedure.  After obtaining informed consent, the colonoscope                            was passed under direct vision. Throughout the                            procedure, the patient's blood pressure, pulse, and                            oxygen saturations were monitored continuously. The                            Colonoscope was introduced through the anus and                  advanced to the the terminal ileum, with                            identification of the appendiceal orifice and IC                            valve. The colonoscopy was performed without                            difficulty. The patient tolerated the procedure                            well. The quality of the bowel preparation was                            excellent. The terminal ileum, ileocecal valve,                            appendiceal orifice, and rectum were photographed.                            The bowel preparation used was Miralax via split                            dose instruction. Scope In: 3:46:40 PM Scope Out: 4:01:06 PM Scope Withdrawal Time: 0 hours 11 minutes 42 seconds  Total Procedure Duration: 0 hours 14 minutes 26 seconds  Findings:                 The perianal and digital rectal examinations were                            normal.                           A patchy area of mucosa in the terminal ileum was                            slightly blue and pale in color. Biopsies were  taken with a cold forceps for histology.                            Verification of patient identification for the                            specimen was done. Estimated blood loss was minimal.                           Discontinuous areas of ulcerated mucosa with no                            stigmata of recent bleeding were present in the                            cecum and at the ileocecal valve. Biopsies were                            taken with a cold forceps for histology.                            Verification of patient identification for the                            specimen was done. Estimated blood loss was minimal.                           Multiple diverticula were found in the left colon.                           Internal hemorrhoids were found.                           The exam was otherwise without abnormality on                             direct and retroflexion views. Complications:            No immediate complications. Estimated Blood Loss:     Estimated blood loss was minimal. Impression:               - Slightly blue and pale in color mucosa in the                            terminal ileum. Biopsied.                           - Mucosal ulceration. Biopsied.                           - Diverticulosis in the left colon.                           - Internal hemorrhoids.                           -  The examination was otherwise normal on direct                            and retroflexion views. Recommendation:           - Patient has a contact number available for                            emergencies. The signs and symptoms of potential                            delayed complications were discussed with the                            patient. Return to normal activities tomorrow.                            Written discharge instructions were provided to the                            patient.                           - Resume previous diet.                           - Continue present medications. Stay on dicyclomine                            but try QAC - she is given work note to return 9/28                            as pain is interfering with ability to do job                           - Repeat colonoscopy is recommended. The                            colonoscopy date will be determined after pathology                            results from today's exam become available for                            review. Gatha Mayer, MD 05/15/2019 4:11:39 PM This report has been signed electronically.

## 2019-05-15 NOTE — Patient Instructions (Addendum)
There was some slight inflammation at the beginning of the colon - I took biopsies and also took biopsies of the small intestin which dod not have inflammation evident but did have a possible change in the color of the lining.  No signs of cancer or anything bad.  I need to see the biopsies and will make recommendations.  It is most likely that you had an infection that caused inflammation and it is still healing.  Per Dr Craig Staggers Dycyclomine before every meal ( this was already prescribed for you)  HAPPY RETIREMENT!  I appreciate the opportunity to care for you. Gatha Mayer, MD, [facility]   Await biopsy results from Dr Carlean Purl  Given Information of Diverticulosis and Hemorrhoids.    YOU HAD AN ENDOSCOPIC PROCEDURE TODAY AT [facility]:   Refer to the procedure report that was given to you for any specific questions about what was found during the examination.  If the procedure report does not answer your questions, please call your gastroenterologist to clarify.  If you requested that your care partner not be given the details of your procedure findings, then the procedure report has been included in a sealed envelope for you to review at your convenience later.  YOU SHOULD EXPECT: Some feelings of bloating in the abdomen. Passage of more gas than usual.  Walking can help get rid of the air that was put into your GI tract during the procedure and reduce the bloating. If you had a lower endoscopy (such as a colonoscopy or flexible sigmoidoscopy) you may notice spotting of blood in your stool or on the toilet paper. If you underwent a bowel prep for your procedure, you may not have a normal bowel movement for a few days.  Please Note:  You might notice some irritation and congestion in your nose or some drainage.  This is from the oxygen used during your procedure.  There is no need for concern and it should clear up in a day or so.  SYMPTOMS TO REPORT  IMMEDIATELY:   Following lower endoscopy (colonoscopy or flexible sigmoidoscopy):  Excessive amounts of blood in the stool  Significant tenderness or worsening of abdominal pains  Swelling of the abdomen that is new, acute  Fever of 100F or higher    For urgent or emergent issues, a gastroenterologist can be reached at any hour by calling [phone].   DIET:  We do recommend a small meal at first, but then you may proceed to your regular diet.  Drink plenty of fluids but you should avoid alcoholic beverages for 24 hours.  ACTIVITY:  You should plan to take it easy for the rest of today and you should NOT DRIVE or use heavy machinery until tomorrow (because of the sedation medicines used during the test).    FOLLOW UP: Our staff will call the number listed on your records 48-72 hours following your procedure to check on you and address any questions or concerns that you may have regarding the information given to you following your procedure. If we do not reach you, we will leave a message.  We will attempt to reach you two times.  During this call, we will ask if you have developed any symptoms of COVID 19. If you develop any symptoms (ie: fever, flu-like symptoms, shortness of breath, cough etc.) before then, please call [phone].  If you test positive for Covid 19 in the 2 weeks post procedure, please call and report this information  to [place].    If any biopsies were taken you will be contacted by phone or by letter within the next 1-3 weeks.  Please call us at [phone] if you have not heard about the biopsies in 3 weeks.    SIGNATURES/CONFIDENTIALITY: You and/or your care partner have signed paperwork which will be entered into your electronic medical record.  These signatures attest to the fact that that the information above on your After Visit Summary has been reviewed and is understood.  Full responsibility of the confidentiality of this discharge information lies with  you and/or your care-partner.

## 2019-05-17 ENCOUNTER — Telehealth: Payer: Self-pay

## 2019-05-17 NOTE — Telephone Encounter (Signed)
  Follow up Call-  Call back number 05/15/2019  Post procedure Call Back phone  # [phone]  Permission to leave phone message Yes  Some recent data might be hidden     Patient questions:  Do you have a fever, pain , or abdominal swelling? No. Pain Score  0 *  Have you tolerated food without any problems? Yes.    Have you been able to return to your normal activities? Yes.    Do you have any questions about your discharge instructions: Diet   No. Medications  No. Follow up visit  No.  Do you have questions or concerns about your Care? No.  Actions: * If pain score is 4 or above: No action needed, pain <4.  1. Have you developed a fever since your procedure? no  2.   Have you had an respiratory symptoms (SOB or cough) since your procedure? no  3.   Have you tested positive for COVID 19 since your procedure no  4.   Have you had any family members/close contacts diagnosed with the COVID 19 since your procedure?  no   If yes to any of these questions please route to Joylene John, RN and Alphonsa Gin, Therapist, sports.

## 2019-05-22 DIAGNOSIS — Z0289 Encounter for other administrative examinations: Secondary | ICD-10-CM

## 2019-05-25 NOTE — Progress Notes (Signed)
Addendum: Reviewed and agree with assessment and management plan. Roni Friberg M, MD  

## 2019-05-26 NOTE — Progress Notes (Signed)
[facility] no letter 10 year colon recall please  I called her - bxs show acute colon ulceration and neg TI bxs No chronic changes - she is better - no diarrhea - still using dicyclomine prn  Plan: 1) OV F/U Jessica Zehr 4-6 weeks 2) continue prn dicyclomine 3) If better - ok at OV then f/u prn, if persistent issues would consider CT angio abd vessels - ? Did she have an ischemic event that colonoscopy was on tail end of. I do not think she has Crohn's since no chronic changes on bxs 4) She can be assigned to me as GI doc

## 2019-06-23 ENCOUNTER — Ambulatory Visit: Payer: Self-pay | Admitting: Gastroenterology

## 2019-11-29 ENCOUNTER — Other Ambulatory Visit: Payer: Self-pay | Admitting: Family

## 2020-01-08 ENCOUNTER — Other Ambulatory Visit: Payer: Self-pay | Admitting: Family

## 2020-01-08 DIAGNOSIS — E785 Hyperlipidemia, unspecified: Secondary | ICD-10-CM

## 2020-02-04 ENCOUNTER — Other Ambulatory Visit: Payer: Self-pay | Admitting: Family

## 2020-02-04 DIAGNOSIS — I10 Essential (primary) hypertension: Secondary | ICD-10-CM

## 2020-02-05 ENCOUNTER — Ambulatory Visit (INDEPENDENT_AMBULATORY_CARE_PROVIDER_SITE_OTHER): Payer: Medicare Other

## 2020-02-05 ENCOUNTER — Encounter: Payer: Self-pay | Admitting: Family

## 2020-02-05 ENCOUNTER — Ambulatory Visit (INDEPENDENT_AMBULATORY_CARE_PROVIDER_SITE_OTHER): Payer: Medicare Other | Admitting: Family

## 2020-02-05 ENCOUNTER — Other Ambulatory Visit: Payer: Self-pay

## 2020-02-05 VITALS — BP 155/78 | HR 78 | Temp 98.0°F | Ht 64.0 in | Wt 161.2 lb

## 2020-02-05 DIAGNOSIS — R0789 Other chest pain: Secondary | ICD-10-CM

## 2020-02-05 DIAGNOSIS — R5383 Other fatigue: Secondary | ICD-10-CM | POA: Diagnosis not present

## 2020-02-05 DIAGNOSIS — S29011A Strain of muscle and tendon of front wall of thorax, initial encounter: Secondary | ICD-10-CM | POA: Diagnosis not present

## 2020-02-05 DIAGNOSIS — I10 Essential (primary) hypertension: Secondary | ICD-10-CM

## 2020-02-05 DIAGNOSIS — E785 Hyperlipidemia, unspecified: Secondary | ICD-10-CM

## 2020-02-05 DIAGNOSIS — Z8249 Family history of ischemic heart disease and other diseases of the circulatory system: Secondary | ICD-10-CM

## 2020-02-05 MED ORDER — PREDNISONE 10 MG (21) PO TBPK: ORAL_TABLET | ORAL | 0 refills | Status: DC

## 2020-02-05 MED ORDER — MELOXICAM 7.5 MG PO TABS: ORAL_TABLET | ORAL | 2 refills | Status: DC

## 2020-02-05 MED ORDER — BACLOFEN 10 MG PO TABS: 10.0000 mg | ORAL_TABLET | Freq: Three times a day (TID) | ORAL | 0 refills | Status: DC

## 2020-02-05 NOTE — Patient Instructions (Signed)

## 2020-02-05 NOTE — Progress Notes (Signed)
Subjective:    Patient ID: Krista Finley, female    DOB: [birth year], 69 y.o.   MRN: [id number]  Chief Complaint  Patient presents with  . Breast Pain    left breast with sharp pain going down arm     Chest Pain  This is a new problem. The current episode started 1 to 4 weeks ago. The onset quality is gradual. The problem occurs constantly. The problem has been gradually worsening. The pain is present in the substernal region. The pain is at a severity of 8/10. The pain is moderate. Quality: aching and throbbing. The pain radiates to the left arm. Associated symptoms include malaise/fatigue. Pertinent negatives include no cough, diaphoresis, dizziness, irregular heartbeat, palpitations or shortness of breath. The pain is aggravated by nothing. She has tried nothing for the symptoms. The treatment provided no relief. Risk factors include post-menopausal.  Her past medical history is significant for hypertension and strokes.  Pertinent negatives for past medical history include no MI and no mitral valve prolapse.  Her family medical history is significant for CAD.      Review of Systems  Constitutional: Positive for malaise/fatigue. Negative for diaphoresis.  Respiratory: Negative for cough and shortness of breath.   Cardiovascular: Positive for chest pain. Negative for palpitations.  Neurological: Negative for dizziness.  All other systems reviewed and are negative.      Objective:   Physical Exam Vitals reviewed.  Constitutional:      General: She is not in acute distress.    Appearance: She is well-developed.  HENT:     Head: Normocephalic and atraumatic.  Eyes:     Pupils: Pupils are equal, round, and reactive to light.  Neck:     Thyroid: No thyromegaly.  Cardiovascular:     Rate and Rhythm: Normal rate and regular rhythm.     Heart sounds: Normal heart sounds. No murmur heard.   Pulmonary:     Effort: Pulmonary effort is normal. No respiratory distress.      Breath sounds: Normal breath sounds. No wheezing.  Abdominal:     General: Bowel sounds are normal. There is no distension.     Palpations: Abdomen is soft.     Tenderness: There is no abdominal tenderness.  Musculoskeletal:        General: Tenderness present. Normal range of motion.     Cervical back: Normal range of motion and neck supple.  Skin:    General: Skin is warm and dry.          Comments: Pain with palpation of chest muscle  Neurological:     Mental Status: She is alert and oriented to person, place, and time.     Cranial Nerves: No cranial nerve deficit.     Deep Tendon Reflexes: Reflexes are normal and symmetric.  Psychiatric:        Behavior: Behavior normal.        Thought Content: Thought content normal.        Judgment: Judgment normal.     EKG- Sinus Rhythm     BP (!) 155/78   Pulse 78   Temp 98 F (36.7 C) (Oral)   Ht 5\' 4"  (1.626 m)   Wt 161 lb 3.2 oz (73.1 kg)   BMI 27.67 kg/m   Assessment & Plan:  Krista Finley comes in today with chief complaint of Breast Pain (left breast with sharp pain going down arm )   Diagnosis and orders addressed:  1. Other  chest pain - EKG 12-Lead - Ambulatory referral to Cardiology - DG Chest 2 View; Future  2. Fatigue, unspecified type - Ambulatory referral to Cardiology  3. Muscle strain of chest wall, initial encounter - meloxicam (MOBIC) 7.5 MG tablet; TAKE 1 TABLET BY MOUTH EVERY 12 HOURS WITH FOOD AS NEEDED FOR PAIN  Dispense: 60 tablet; Refill: 2 - predniSONE (STERAPRED UNI-PAK 21 TAB) 10 MG (21) TBPK tablet; Use as directed  Dispense: 21 tablet; Refill: 0 - baclofen (LIORESAL) 10 MG tablet; Take 1 tablet (10 mg total) by mouth 3 (three) times daily.  Dispense: 30 each; Refill: 0  4. Family history of early CAD - Ambulatory referral to Cardiology  5. Essential hypertension - Ambulatory referral to Cardiology  6. Hyperlipidemia, unspecified hyperlipidemia type - Ambulatory referral to  Cardiology   Given that your pain is reproducible, I believe this is MSK related. However, given family hx and current chronic problems will do referral to Cardiologists. Discussed if pain worsens or SOB needs to go to ED.     Evelina Dun, FNP

## 2020-02-14 ENCOUNTER — Encounter: Payer: Self-pay | Admitting: Cardiovascular Disease

## 2020-02-20 ENCOUNTER — Other Ambulatory Visit: Payer: Self-pay | Admitting: Family

## 2020-02-20 DIAGNOSIS — E785 Hyperlipidemia, unspecified: Secondary | ICD-10-CM

## 2020-03-29 ENCOUNTER — Other Ambulatory Visit: Payer: Self-pay | Admitting: Family

## 2020-03-29 DIAGNOSIS — I10 Essential (primary) hypertension: Secondary | ICD-10-CM

## 2020-06-19 ENCOUNTER — Other Ambulatory Visit: Payer: Self-pay | Admitting: Family

## 2020-06-19 DIAGNOSIS — I10 Essential (primary) hypertension: Secondary | ICD-10-CM

## 2020-07-08 ENCOUNTER — Other Ambulatory Visit: Payer: Self-pay | Admitting: Family

## 2020-07-08 DIAGNOSIS — E785 Hyperlipidemia, unspecified: Secondary | ICD-10-CM

## 2020-09-09 ENCOUNTER — Other Ambulatory Visit: Payer: Self-pay | Admitting: Family

## 2020-09-09 DIAGNOSIS — S29011A Strain of muscle and tendon of front wall of thorax, initial encounter: Secondary | ICD-10-CM

## 2020-09-09 DIAGNOSIS — E785 Hyperlipidemia, unspecified: Secondary | ICD-10-CM

## 2020-09-11 ENCOUNTER — Other Ambulatory Visit: Payer: Self-pay | Admitting: Family

## 2020-09-11 DIAGNOSIS — Z1231 Encounter for screening mammogram for malignant neoplasm of breast: Secondary | ICD-10-CM

## 2020-09-27 ENCOUNTER — Telehealth: Payer: Self-pay

## 2020-09-27 DIAGNOSIS — E785 Hyperlipidemia, unspecified: Secondary | ICD-10-CM

## 2020-09-27 MED ORDER — ATORVASTATIN CALCIUM 20 MG PO TABS: ORAL_TABLET | ORAL | 0 refills | Status: DC

## 2020-09-27 NOTE — Telephone Encounter (Signed)
30 day supply sent to pharmacy.  Needs to be seen for any further refills.

## 2020-09-27 NOTE — Telephone Encounter (Signed)
  Prescription Request  09/27/2020  What is the name of the medication or equipment? atorvastatin (LIPITOR) 20 MG tablet / pt has appt in march   Have you contacted your pharmacy to request a refill? (if applicable) yes  Which pharmacy would you like this sent to? [facility]   Patient notified that their request is being sent to the clinical staff for review and that they should receive a response within 2 business days.

## 2020-10-16 ENCOUNTER — Ambulatory Visit
Admission: RE | Admit: 2020-10-16 | Discharge: 2020-10-16 | Disposition: A | Discharge status: Home / Self Care | Payer: Medicare Other | Source: Ambulatory Visit | Attending: Family | Admitting: Family

## 2020-10-16 ENCOUNTER — Other Ambulatory Visit: Payer: Self-pay

## 2020-10-16 DIAGNOSIS — Z1231 Encounter for screening mammogram for malignant neoplasm of breast: Secondary | ICD-10-CM

## 2020-10-25 ENCOUNTER — Ambulatory Visit (INDEPENDENT_AMBULATORY_CARE_PROVIDER_SITE_OTHER): Payer: Medicare Other | Admitting: Family

## 2020-10-25 ENCOUNTER — Other Ambulatory Visit: Payer: Self-pay

## 2020-10-25 ENCOUNTER — Ambulatory Visit (INDEPENDENT_AMBULATORY_CARE_PROVIDER_SITE_OTHER): Payer: Medicare Other

## 2020-10-25 ENCOUNTER — Encounter: Payer: Self-pay | Admitting: Family

## 2020-10-25 ENCOUNTER — Other Ambulatory Visit: Payer: Self-pay | Admitting: Family Medicine

## 2020-10-25 VITALS — BP 178/84 | HR 76 | Temp 97.3°F | Ht 64.0 in | Wt 166.8 lb

## 2020-10-25 DIAGNOSIS — S29011A Strain of muscle and tendon of front wall of thorax, initial encounter: Secondary | ICD-10-CM

## 2020-10-25 DIAGNOSIS — Z780 Asymptomatic menopausal state: Secondary | ICD-10-CM

## 2020-10-25 DIAGNOSIS — E785 Hyperlipidemia, unspecified: Secondary | ICD-10-CM | POA: Diagnosis not present

## 2020-10-25 DIAGNOSIS — S29011D Strain of muscle and tendon of front wall of thorax, subsequent encounter: Secondary | ICD-10-CM | POA: Diagnosis not present

## 2020-10-25 DIAGNOSIS — I10 Essential (primary) hypertension: Secondary | ICD-10-CM

## 2020-10-25 DIAGNOSIS — E559 Vitamin D deficiency, unspecified: Secondary | ICD-10-CM | POA: Diagnosis not present

## 2020-10-25 MED ORDER — AMLODIPINE BESYLATE 10 MG PO TABS: 10.0000 mg | ORAL_TABLET | Freq: Every day | ORAL | 3 refills | Status: DC

## 2020-10-25 MED ORDER — AMLODIPINE BESYLATE 5 MG PO TABS: 5.0000 mg | ORAL_TABLET | Freq: Every day | ORAL | 3 refills | Status: DC

## 2020-10-25 MED ORDER — METOPROLOL TARTRATE 25 MG PO TABS: 25.0000 mg | ORAL_TABLET | Freq: Two times a day (BID) | ORAL | 1 refills | Status: DC

## 2020-10-25 MED ORDER — ATORVASTATIN CALCIUM 20 MG PO TABS
ORAL_TABLET | ORAL | 0 refills | Status: DC
Start: 2020-10-25 — End: 2020-12-24

## 2020-10-25 MED ORDER — ASPIRIN EC 81 MG PO TBEC: 81.0000 mg | DELAYED_RELEASE_TABLET | Freq: Every day | ORAL | 1 refills | Status: DC

## 2020-10-25 MED ORDER — MELOXICAM 7.5 MG PO TABS
ORAL_TABLET | ORAL | 2 refills | Status: DC
Start: 2020-10-25 — End: 2021-01-23

## 2020-10-25 NOTE — Progress Notes (Signed)
Subjective:    Patient ID: Krista Finley, female    DOB: 18-Dec-1950, 71 y.o.   MRN: [id number]  Chief Complaint  Patient presents with  . Medical Management of Chronic Issues  . Breast Pain    On left side Patient has already been seen for it and had mammo   . Hypertension    Last time she took med was 03/03 around 10 am   PT presents to the office today for chronic follow up. She is complaining of left rib pain. She was seen on 02/05/20 and given baclofen and prednisone that did not help. She reports the pain is worse when laying down. She had a negative chest x-ray then.  Hypertension This is a chronic problem. The current episode started more than 1 year ago. The problem has been waxing and waning since onset. The problem is uncontrolled. Pertinent negatives include no malaise/fatigue, peripheral edema or shortness of breath. Risk factors for coronary artery disease include dyslipidemia and sedentary lifestyle. The current treatment provides moderate improvement.  Hyperlipidemia This is a chronic problem. The current episode started more than 1 year ago. The problem is uncontrolled. Recent lipid tests were reviewed and are high. Pertinent negatives include no shortness of breath. Current antihyperlipidemic treatment includes statins. The current treatment provides moderate improvement of lipids. Risk factors for coronary artery disease include dyslipidemia, hypertension and a sedentary lifestyle.      Review of Systems  Constitutional: Negative for malaise/fatigue.  Respiratory: Negative for shortness of breath.   All other systems reviewed and are negative.      Objective:   Physical Exam Vitals reviewed.  Constitutional:      General: She is not in acute distress.    Appearance: She is well-developed and well-nourished.  HENT:     Head: Normocephalic and atraumatic.     Right Ear: Tympanic membrane normal.     Left Ear: Tympanic membrane normal.     Mouth/Throat:      Mouth: Oropharynx is clear and moist.  Eyes:     Pupils: Pupils are equal, round, and reactive to light.  Neck:     Thyroid: No thyromegaly.  Cardiovascular:     Rate and Rhythm: Normal rate and regular rhythm.     Pulses: Intact distal pulses.     Heart sounds: Normal heart sounds. No murmur heard.   Pulmonary:     Effort: Pulmonary effort is normal. No respiratory distress.     Breath sounds: Normal breath sounds. No wheezing.  Abdominal:     General: Bowel sounds are normal. There is no distension.     Palpations: Abdomen is soft.     Tenderness: There is no abdominal tenderness.  Musculoskeletal:        General: Tenderness present. No edema. Normal range of motion.       Arms:     Cervical back: Normal range of motion and neck supple.     Comments: Tenderness left anterior ribs with palpation, no bruising or deformity noted  Skin:    General: Skin is warm and dry.  Neurological:     Mental Status: She is alert and oriented to person, place, and time.     Cranial Nerves: No cranial nerve deficit.     Deep Tendon Reflexes: Reflexes are normal and symmetric.  Psychiatric:        Mood and Affect: Mood and affect normal.        Behavior: Behavior normal.  Thought Content: Thought content normal.        Judgment: Judgment normal.       BP (!) 178/84   Pulse 76   Temp (!) 97.3 F (36.3 C) (Temporal)   Ht '5\' 4"'  (1.626 m)   Wt 166 lb 12.8 oz (75.7 kg)   BMI 28.63 kg/m      Assessment & Plan:  Krista Finley comes in today with chief complaint of Medical Management of Chronic Issues, Breast Pain (On left side Patient has already been seen for it and had mammo ), and Hypertension (Last time she took med was 03/03 around 10 am)   Diagnosis and orders addressed:  1. Essential hypertension Will increase Norvasc to 10 mg from 5 mg -Daily blood pressure log given with instructions on how to fill out and told to bring to next visit -Dash diet information  given -Exercise encouraged - Stress Management  -Continue current meds -RTO in 2-4 weeks - metoprolol tartrate (LOPRESSOR) 25 MG tablet; Take 1 tablet (25 mg total) by mouth 2 (two) times daily.  Dispense: 180 tablet; Refill: 1 - CMP14+EGFR - CBC with Differential/Platelet - amLODipine (NORVASC) 10 MG tablet; Take 1 tablet (10 mg total) by mouth daily.  Dispense: 90 tablet; Refill: 3  2. Hyperlipidemia, unspecified hyperlipidemia type - atorvastatin (LIPITOR) 20 MG tablet; TAKE 1 TABLET BY MOUTH ONCE DAILY AT 6 PM (Needs to be seen before next refill)  Dispense: 30 tablet; Refill: 0 - CMP14+EGFR - CBC with Differential/Platelet - Lipid panel  3. Muscle strain of chest wall, initial encounter - meloxicam (MOBIC) 7.5 MG tablet; TAKE 1 TABLET BY MOUTH EVERY 12 HOURS WITH FOOD AS NEEDED FOR PAIN  Dispense: 60 tablet; Refill: 2 - CMP14+EGFR - CBC with Differential/Platelet - Ambulatory referral to Sports Medicine  4. Vitamin D deficiency - CMP14+EGFR - CBC with Differential/Platelet  5. Post-menopause - DG [facility] DEXA - CMP14+EGFR - CBC with Differential/Platelet   Labs pending Health Maintenance reviewed Diet and exercise encouraged  Follow up plan: 2-4 weeks to recheck HTN   Evelina Dun, FNP

## 2020-10-25 NOTE — Patient Instructions (Signed)

## 2020-10-26 LAB — CMP14+EGFR
ALT: 9 IU/L (ref 0–32)
AST: 14 IU/L (ref 0–40)
Albumin/Globulin Ratio: 1.7 (ref 1.2–2.2)
Albumin: 4.4 g/dL (ref 3.8–4.8)
Alkaline Phosphatase: 120 IU/L (ref 44–121)
BUN/Creatinine Ratio: 8 — ABNORMAL LOW (ref 12–28)
BUN: 8 mg/dL (ref 8–27)
Bilirubin Total: 0.5 mg/dL (ref 0.0–1.2)
CO2: 25 mmol/L (ref 20–29)
Calcium: 9.3 mg/dL (ref 8.7–10.3)
Chloride: 103 mmol/L (ref 96–106)
Creatinine, Ser: 0.95 mg/dL (ref 0.57–1.00)
Globulin, Total: 2.6 g/dL (ref 1.5–4.5)
Glucose: 93 mg/dL (ref 65–99)
Potassium: 4.4 mmol/L (ref 3.5–5.2)
Sodium: 141 mmol/L (ref 134–144)
Total Protein: 7 g/dL (ref 6.0–8.5)
eGFR: 66 mL/min/{1.73_m2} (ref >59)

## 2020-10-26 LAB — LIPID PANEL
Chol/HDL Ratio: 2.4 ratio (ref 0.0–4.4)
Cholesterol, Total: 188 mg/dL (ref 100–199)
HDL: 77 mg/dL (ref >39)
LDL Chol Calc (NIH): 99 mg/dL (ref 0–99)
Triglycerides: 63 mg/dL (ref 0–149)
VLDL Cholesterol Cal: 12 mg/dL (ref 5–40)

## 2020-10-26 LAB — CBC WITH DIFFERENTIAL/PLATELET
Basophils Absolute: 0 10*3/uL (ref 0.0–0.2)
Basos: 0 %
EOS (ABSOLUTE): 0.2 10*3/uL (ref 0.0–0.4)
Eos: 3 %
Hematocrit: 38.8 % (ref 34.0–46.6)
Hemoglobin: 13 g/dL (ref 11.1–15.9)
Immature Grans (Abs): 0 10*3/uL (ref 0.0–0.1)
Immature Granulocytes: 0 %
Lymphocytes Absolute: 1.7 10*3/uL (ref 0.7–3.1)
Lymphs: 33 %
MCH: 30.5 pg (ref 26.6–33.0)
MCHC: 33.5 g/dL (ref 31.5–35.7)
MCV: 91 fL (ref 79–97)
Monocytes Absolute: 0.5 10*3/uL (ref 0.1–0.9)
Monocytes: 10 %
Neutrophils Absolute: 2.8 10*3/uL (ref 1.4–7.0)
Neutrophils: 54 %
Platelets: 273 10*3/uL (ref 150–450)
RBC: 4.26 x10E6/uL (ref 3.77–5.28)
RDW: 12.7 % (ref 11.7–15.4)
WBC: 5.3 10*3/uL (ref 3.4–10.8)

## 2020-10-30 ENCOUNTER — Ambulatory Visit: Payer: Medicare Other | Admitting: Family Medicine

## 2020-11-08 ENCOUNTER — Other Ambulatory Visit: Payer: Self-pay

## 2020-11-08 ENCOUNTER — Ambulatory Visit (INDEPENDENT_AMBULATORY_CARE_PROVIDER_SITE_OTHER): Payer: Medicare Other | Admitting: Family Medicine

## 2020-11-08 VITALS — BP 152/78 | Ht 64.0 in | Wt 166.0 lb

## 2020-11-08 DIAGNOSIS — R1012 Left upper quadrant pain: Secondary | ICD-10-CM | POA: Insufficient documentation

## 2020-11-08 DIAGNOSIS — R933 Abnormal findings on diagnostic imaging of other parts of digestive tract: Secondary | ICD-10-CM | POA: Diagnosis not present

## 2020-11-08 DIAGNOSIS — R195 Other fecal abnormalities: Secondary | ICD-10-CM | POA: Insufficient documentation

## 2020-11-08 MED ORDER — TRAMADOL-ACETAMINOPHEN 37.5-325 MG PO TABS: 1.0000 | ORAL_TABLET | Freq: Two times a day (BID) | ORAL | 0 refills | Status: DC | PRN

## 2020-11-08 NOTE — Assessment & Plan Note (Signed)
Concerning LUQ  Pain that seems to involve abdomiinal viscera as well as a part of her rib. Given her recent history of bloating, the very mild pelvic (bladder area mostly) discomfort with deep palpation, and her recent hx of stool change, will get CT abdomen and pelvis. Tramadol for pain. I will f/u with her after CT scan.

## 2020-11-08 NOTE — Patient Instructions (Signed)
Let me see you in cliinic ain about 3 weeks. Please call if things get worse!

## 2020-11-08 NOTE — Progress Notes (Signed)
  Krista Finley - 70 y.o. female MRN [id number]  Date of birth: [birth year]    SUBJECTIVE:      Chief Complaint:/ HPI:  Left sided abdominal/rib pain, has been there many weaks but has intensified in last 2 weeks until itis almost unbearable. Cannot find a comfortable position. Some increase in pain with very deep inspiration or rotation of torso.  Keeping her awake at night. Radiates all the way artound the left side of her abdomen to midline. No rash, no fever. Has had some increased constipation and some darker stools in last few weeks. Abdomen feels a little bloated at times.  PERTINENT  PMH / PSH: I have reviewed the patient's medications, allergies, past medical and surgical history, smoking status.  Pertinent findings that relate to today's visit / issues include: Hx abnormal CT abdomen pelvis Sept 2020: IMPRESSION: 1. Mild wall thickening without surrounding inflammation involving the terminal ileum concerning for inflammatory or infectious terminal ileitis. 2. Chronic increase caliber of the common bile duct. No choledocholithiasis or mass noted. 3.  Aortic Atherosclerosis (ICD10-I70.0    Hx colonoscopy with abnormal findings 04/2019: Slightly blue and pale in color mucosa in the terminal ileum. Biopsied. - Mucosal ulceration. Biopsied. - Diverticulosis in the left colon. - Internal hemorrhoids. - The examination was otherwise normal on direct and retroflexion views.  -   OBJECTIVE: BP (!) 152/78   Ht 5\' 4"  (1.626 m)   Wt 166 lb (75.3 kg)   BMI 28.49 kg/m   Physical Exam:  Vital signs are reviewed. GEN WD WN NAD but she has obvious discomfort when changing positions, especially sitting to lying down on her back LUNGS CTA B ABD: soft. mildy distended but normal bowel sounds; no rebound or guarding. Some very mild diffuse pelvic discomfort with deep palpation in area below umbilicus. TTP over ribs right under her left breast area (breast tissue not involved) One area of  her rib is very ttp.No deformity noted.  Back: no deformity. No vertebral tenderness to palpation or percussion in thoracic or lumbar spine. No muscle spasm is noted. Cannot reproduce her pain by palpation here. SKIn no rash or unusual lesions, no erythema or unusual warmth on complete skin exam of back and abdomen.  ASSESSMENT & PLAN:  See problem based charting & AVS for pt instructions. No problem-specific Assessment & Plan notes found for this encounter.

## 2020-11-22 ENCOUNTER — Ambulatory Visit
Admission: RE | Admit: 2020-11-22 | Discharge: 2020-11-22 | Disposition: A | Discharge status: Home / Self Care | Payer: Medicare Other | Source: Ambulatory Visit | Attending: Family Medicine | Admitting: Family Medicine

## 2020-11-22 ENCOUNTER — Other Ambulatory Visit: Payer: Self-pay

## 2020-11-22 DIAGNOSIS — R195 Other fecal abnormalities: Secondary | ICD-10-CM

## 2020-11-22 DIAGNOSIS — R1012 Left upper quadrant pain: Secondary | ICD-10-CM

## 2020-11-22 MED ORDER — IOPAMIDOL (ISOVUE-300) INJECTION 61%
100.0000 mL | Freq: Once | INTRAVENOUS | Status: AC | PRN
  Administered 2020-11-22: 100 mL via INTRAVENOUS

## 2020-11-27 ENCOUNTER — Ambulatory Visit (INDEPENDENT_AMBULATORY_CARE_PROVIDER_SITE_OTHER): Payer: Medicare Other

## 2020-11-27 VITALS — Ht 64.0 in | Wt 161.0 lb

## 2020-11-27 DIAGNOSIS — Z0000 Encounter for general adult medical examination without abnormal findings: Secondary | ICD-10-CM | POA: Diagnosis not present

## 2020-11-27 NOTE — Progress Notes (Signed)
Subjective:   Krista Finley is a 70 y.o. female who presents for an Initial Medicare Annual Wellness Visit.  Virtual Visit via Telephone Note  I connected with  Krista Finley on 11/27/20 at  4:15 PM EDT by telephone and verified that I am speaking with the correct person using two identifiers.  Location: Patient: Home Provider: [facility] Persons participating in the virtual visit: patient/Nurse Health Advisor   I discussed the limitations, risks, security and privacy concerns of performing an evaluation and management service by telephone and the availability of in person appointments. The patient expressed understanding and agreed to proceed.  Interactive audio and video telecommunications were attempted between this nurse and patient, however failed, due to patient having technical difficulties OR patient did not have access to video capability.  We continued and completed visit with audio only.  Some vital signs may be absent or patient reported.   Daune Divirgilio E Lani Mendiola, LPN   Review of Systems     Cardiac Risk Factors include: advanced age (>71men, >56 women);dyslipidemia;hypertension;sedentary lifestyle     Objective:    Today's Vitals   11/27/20 1327  Weight: 161 lb (73 kg)  Height: 5\' 4"  (1.626 m)  PainSc: 0-No pain   Body mass index is 27.64 kg/m.  Advanced Directives 11/27/2020 05/06/2019 05/01/2019 12/22/2018 03/16/2017 04/25/2015  Does Patient Have a Medical Advance Directive? No No No No No No  Would patient like information on creating a medical advance directive? No - Patient declined No - Patient declined No - Patient declined No - Patient declined No - Patient declined No - patient declined information    Current Medications (verified) Outpatient Encounter Medications as of 11/27/2020  Medication Sig  . amLODipine (NORVASC) 5 MG tablet Take 1 tablet (5 mg total) by mouth daily.  Marland Kitchen aspirin EC 81 MG tablet Take 1 tablet (81 mg total) by mouth daily.  Marland Kitchen atorvastatin  (LIPITOR) 20 MG tablet TAKE 1 TABLET BY MOUTH ONCE DAILY AT 6 PM (Needs to be seen before next refill)  . meloxicam (MOBIC) 7.5 MG tablet TAKE 1 TABLET BY MOUTH EVERY 12 HOURS WITH FOOD AS NEEDED FOR PAIN  . traMADol-acetaminophen (ULTRACET) 37.5-325 MG tablet Take 1 tablet by mouth 2 (two) times daily as needed. For abdominal pain  . metoprolol tartrate (LOPRESSOR) 25 MG tablet Take 1 tablet (25 mg total) by mouth 2 (two) times daily.   No facility-administered encounter medications on file as of 11/27/2020.    Allergies (verified) Patient has no known allergies.   History: Past Medical History:  Diagnosis Date  . CVA (cerebral vascular accident) ([place])   . Gastritis   . Headache   . Hyperlipidemia   . Hypertension    Past Surgical History:  Procedure Laterality Date  . ELBOW SURGERY Right    Family History  Problem Relation Age of Onset  . Hypertension Mother   . Heart disease Mother   . Heart disease Father        MI  . Stomach cancer Maternal Aunt   . Stomach cancer Maternal Grandmother   . Colon cancer Neg Hx   . Esophageal cancer Neg Hx   . Rectal cancer Neg Hx    Social History   Socioeconomic History  . Marital status: Single    Spouse name: Not on file  . Number of children: Not on file  . Years of education: Not on file  . Highest education level: Not on file  Occupational History  Employer: [employer]  Tobacco Use  . Smoking status: Never Smoker  . Smokeless tobacco: Never Used  Vaping Use  . Vaping Use: Never used  Substance and Sexual Activity  . Alcohol use: Yes    Comment: 1 drink couple times of year  . Drug use: No  . Sexual activity: Never  Other Topics Concern  . Not on file  Social History Narrative   Stays with her mother and disabled brother   Social Determinants of Health   Financial Resource Strain: Low Risk   . Difficulty of Paying Living Expenses: Not hard at all  Food Insecurity: No Food Insecurity  . Worried About Ship broker in the Last Year: Never true  . Ran Out of Food in the Last Year: Never true  Transportation Needs: No Transportation Needs  . Lack of Transportation (Medical): No  . Lack of Transportation (Non-Medical): No  Physical Activity: Insufficiently Active  . Days of Exercise per Week: 7 days  . Minutes of Exercise per Session: 20 min  Stress: No Stress Concern Present  . Feeling of Stress : Not at all  Social Connections: Unknown  . Frequency of Communication with Friends and Family: More than three times a week  . Frequency of Social Gatherings with Friends and Family: Twice a week  . Attends Religious Services: More than 4 times per year  . Active Member of Clubs or Organizations: Yes  . Attends Archivist Meetings: More than 4 times per year  . Marital Status: Not on file    Tobacco Counseling Counseling given: Not Answered   Clinical Intake:  Pre-visit preparation completed: Yes  Pain : No/denies pain Pain Score: 0-No pain     BMI - recorded: 27.64 Nutritional Status: BMI 25 -29 Overweight Nutritional Risks: None Diabetes: No  How often do you need to have someone help you when you read instructions, pamphlets, or other written materials from your doctor or pharmacy?: 1 - Never  Diabetic? no  Interpreter Needed?: No  Information entered by :: Sabrena Gavitt, LPN   Activities of Daily Living In your present state of health, do you have any difficulty performing the following activities: 11/27/2020  Hearing? N  Vision? N  Difficulty concentrating or making decisions? Y  Walking or climbing stairs? N  Dressing or bathing? N  Doing errands, shopping? N  Preparing Food and eating ? N  Using the Toilet? N  In the past six months, have you accidently leaked urine? N  Do you have problems with loss of bowel control? N  Managing your Medications? N  Managing your Finances? N  Housekeeping or managing your Housekeeping? N  Some recent data might be hidden     Patient Care Team: Sharion Balloon, FNP as PCP - General (Nurse Practitioner)  Indicate any recent Medical Services you may have received from other than [facility] providers in the past year (date may be approximate).     Assessment:   This is a routine wellness examination for [place].  Hearing/Vision screen  Hearing Screening   125Hz  250Hz  500Hz  1000Hz  2000Hz  3000Hz  4000Hz  6000Hz  8000Hz   Right ear:           Left ear:           Comments: No complaints of hearing loss  Vision Screening Comments: Wears glasses - Annual visits with Dr Marin Comment in [place] - Past due for eye exam  Dietary issues and exercise activities discussed: Current Exercise Habits: Home  exercise routine, Type of exercise: walking, Time (Minutes): 20, Frequency (Times/Week): 7, Weekly Exercise (Minutes/Week): 140, Intensity: Mild, Exercise limited by: None identified  Goals    . DIET - INCREASE WATER INTAKE     Recommend 6-8 glasses per day    . Patient Stated     She would like to lose a little weight      Depression Screen PHQ 2/9 Scores 11/27/2020 02/05/2020 02/16/2019 02/25/2018 10/23/2017 10/21/2017 09/09/2017  PHQ - 2 Score 0 0 0 0 0 0 0    Fall Risk Fall Risk  11/27/2020 02/05/2020 02/16/2019 02/25/2018 10/23/2017  Falls in the past year? 0 0 0 No No  Number falls in past yr: 0 - - - -  Injury with Fall? 0 - - - -  Risk for fall due to : Medication side effect - - - -  Risk for fall due to: Comment - - - - -  Follow up Falls prevention discussed - - - -    FALL RISK PREVENTION PERTAINING TO THE HOME:  Any stairs in or around the home? No  If so, are there any without handrails? No  Home free of loose throw rugs in walkways, pet beds, electrical cords, etc? Yes  Adequate lighting in your home to reduce risk of falls? Yes   ASSISTIVE DEVICES UTILIZED TO PREVENT FALLS:  Life alert? No  Use of a cane, walker or w/c? No  Grab bars in the bathroom? No  Shower chair or bench in shower? No  Elevated toilet seat  or a handicapped toilet? No   TIMED UP AND GO:  Was the test performed? No . Telephonic visit  Cognitive Function:     6CIT Screen 11/27/2020  What Year? 0 points  What month? 0 points  What time? 0 points  Count back from 20 0 points  Months in reverse 0 points  Repeat phrase 0 points  Total Score 0    Immunizations Immunization History  Administered Date(s) Administered  . PFIZER(Purple Top)SARS-COV-2 Vaccination 11/16/2019, 12/09/2019, 08/23/2020  . Tdap 09/09/2017  . Zoster 04/20/2013    TDAP status: Up to date  Flu Vaccine status: Declined, Education has been provided regarding the importance of this vaccine but patient still declined. Advised may receive this vaccine at local pharmacy or Health Dept. Aware to provide a copy of the vaccination record if obtained from local pharmacy or Health Dept. Verbalized acceptance and understanding.  Pneumococcal vaccine status: Declined,  Education has been provided regarding the importance of this vaccine but patient still declined. Advised may receive this vaccine at local pharmacy or Health Dept. Aware to provide a copy of the vaccination record if obtained from local pharmacy or Health Dept. Verbalized acceptance and understanding.   Covid-19 vaccine status: Completed vaccines  Qualifies for Shingles Vaccine? Yes   Zostavax completed Yes   Shingrix Completed?: No.    Education has been provided regarding the importance of this vaccine. Patient has been advised to call insurance company to determine out of pocket expense if they have not yet received this vaccine. Advised may also receive vaccine at local pharmacy or Health Dept. Verbalized acceptance and understanding.  Screening Tests Health Maintenance  Topic Date Due  . PNA vac Low Risk Adult (1 of 2 - PCV13) 02/04/2021 (Originally 06/24/2018)  . INFLUENZA VACCINE  03/24/2021  . MAMMOGRAM  10/16/2021  . DEXA SCAN  11/06/2022  . TETANUS/TDAP  09/10/2027  . COLONOSCOPY (Pts  45-69yrs Insurance coverage will need to  be confirmed)  05/14/2029  . COVID-19 Vaccine  Completed  . Hepatitis C Screening  Completed  . HPV VACCINES  Aged Out    Health Maintenance  There are no preventive care reminders to display for this patient.  Colorectal cancer screening: Type of screening: Colonoscopy. Completed 05/15/2019. Repeat every 10 years  Mammogram status: Completed 10/16/20. Repeat every year  Bone Density status: Completed 11/05/2020. Results reflect: Bone density results: OSTEOPENIA. Repeat every 2 years.  Lung Cancer Screening: (Low Dose CT Chest recommended if Age 86-80 years, 30 pack-year currently smoking OR have quit w/in 15years.) does not qualify.   Additional Screening:  Hepatitis C Screening: does qualify; Completed 09/09/2017  Vision Screening: Recommended annual ophthalmology exams for early detection of glaucoma and other disorders of the eye. Is the patient up to date with their annual eye exam?  No  Who is the provider or what is the name of the office in which the patient attends annual eye exams? Dr Marin Comment in [place] If pt is not established with a provider, would they like to be referred to a provider to establish care? No .   Dental Screening: Recommended annual dental exams for proper oral hygiene  Community Resource Referral / Chronic Care Management: CRR required this visit?  No   CCM required this visit?  No      Plan:     I have personally reviewed and noted the following in the patient's chart:   . Medical and social history . Use of alcohol, tobacco or illicit drugs  . Current medications and supplements . Functional ability and status . Nutritional status . Physical activity . Advanced directives . List of other physicians . Hospitalizations, surgeries, and ER visits in previous 12 months . Vitals . Screenings to include cognitive, depression, and falls . Referrals and appointments  In addition, I have reviewed and discussed  with patient certain preventive protocols, quality metrics, and best practice recommendations. A written personalized care plan for preventive services as well as general preventive health recommendations were provided to patient.     Sandrea Hammond, LPN   02/27/5464   Nurse Notes: None

## 2020-11-27 NOTE — Patient Instructions (Signed)
Krista Finley , Thank you for taking time to come for your Medicare Wellness Visit. I appreciate your ongoing commitment to your health goals. Please review the following plan we discussed and let me know if I can assist you in the future.   Screening recommendations/referrals: Colonoscopy: Done 05/15/2019 - Repeat in 10 years Mammogram: Done 2/23/200 - Repeat every year Bone Density: Done 11/05/20 - Repeat in 2 years Recommended yearly ophthalmology/optometry visit for glaucoma screening and checkup Recommended yearly dental visit for hygiene and checkup  Vaccinations: Influenza vaccine: Declined Pneumococcal vaccine: Declined Tdap vaccine: Done 09/09/2017 - Repeat in 10 years Shingles vaccine: Zostavax done in 2014 - Shingrix discussed. Please contact your pharmacy for coverage information.    Covid-19: Complete: 11/16/19, 12/09/19, & 08/23/2020  Advanced directives: Advance directive discussed with you today. Even though you declined this today, please call our office should you change your mind, and we can give you the proper paperwork for you to fill out.  Conditions/risks identified: Continue walking - Aim for 30 minutes of brisk walking every day. Try to drink 6-8 glasses of water per day.  Next appointment: Follow up in one year for your annual wellness visit    Preventive Care 65 Years and Older, Female Preventive care refers to lifestyle choices and visits with your health care provider that can promote health and wellness. What does preventive care include?  A yearly physical exam. This is also called an annual well check.  Dental exams once or twice a year.  Routine eye exams. Ask your health care provider how often you should have your eyes checked.  Personal lifestyle choices, including:  Daily care of your teeth and gums.  Regular physical activity.  Eating a healthy diet.  Avoiding tobacco and drug use.  Limiting alcohol use.  Practicing safe sex.  Taking  low-dose aspirin every day.  Taking vitamin and mineral supplements as recommended by your health care provider. What happens during an annual well check? The services and screenings done by your health care provider during your annual well check will depend on your age, overall health, lifestyle risk factors, and family history of disease. Counseling  Your health care provider may ask you questions about your:  Alcohol use.  Tobacco use.  Drug use.  Emotional well-being.  Home and relationship well-being.  Sexual activity.  Eating habits.  History of falls.  Memory and ability to understand (cognition).  Work and work Statistician.  Reproductive health. Screening  You may have the following tests or measurements:  Height, weight, and BMI.  Blood pressure.  Lipid and cholesterol levels. These may be checked every 5 years, or more frequently if you are over 52 years old.  Skin check.  Lung cancer screening. You may have this screening every year starting at age 62 if you have a 30-pack-year history of smoking and currently smoke or have quit within the past 15 years.  Fecal occult blood test (FOBT) of the stool. You may have this test every year starting at age 29.  Flexible sigmoidoscopy or colonoscopy. You may have a sigmoidoscopy every 5 years or a colonoscopy every 10 years starting at age 80.  Hepatitis C blood test.  Hepatitis B blood test.  Sexually transmitted disease (STD) testing.  Diabetes screening. This is done by checking your blood sugar (glucose) after you have not eaten for a while (fasting). You may have this done every 1-3 years.  Bone density scan. This is done to screen for osteoporosis. You  may have this done starting at age 53.  Mammogram. This may be done every 1-2 years. Talk to your health care provider about how often you should have regular mammograms. Talk with your health care provider about your test results, treatment options, and  if necessary, the need for more tests. Vaccines  Your health care provider may recommend certain vaccines, such as:  Influenza vaccine. This is recommended every year.  Tetanus, diphtheria, and acellular pertussis (Tdap, Td) vaccine. You may need a Td booster every 10 years.  Zoster vaccine. You may need this after age 29.  Pneumococcal 13-valent conjugate (PCV13) vaccine. One dose is recommended after age 4.  Pneumococcal polysaccharide (PPSV23) vaccine. One dose is recommended after age 108. Talk to your health care provider about which screenings and vaccines you need and how often you need them. This information is not intended to replace advice given to you by your health care provider. Make sure you discuss any questions you have with your health care provider. Document Released: 09/06/2015 Document Revised: 04/29/2016 Document Reviewed: 06/11/2015 Elsevier Interactive Patient Education  2017 [place] Prevention in the Home Falls can cause injuries. They can happen to people of all ages. There are many things you can do to make your home safe and to help prevent falls. What can I do on the outside of my home?  Regularly fix the edges of walkways and driveways and fix any cracks.  Remove anything that might make you trip as you walk through a door, such as a raised step or threshold.  Trim any bushes or trees on the path to your home.  Use bright outdoor lighting.  Clear any walking paths of anything that might make someone trip, such as rocks or tools.  Regularly check to see if handrails are loose or broken. Make sure that both sides of any steps have handrails.  Any raised decks and porches should have guardrails on the edges.  Have any leaves, snow, or ice cleared regularly.  Use sand or salt on walking paths during winter.  Clean up any spills in your garage right away. This includes oil or grease spills. What can I do in the bathroom?  Use night  lights.  Install grab bars by the toilet and in the tub and shower. Do not use towel bars as grab bars.  Use non-skid mats or decals in the tub or shower.  If you need to sit down in the shower, use a plastic, non-slip stool.  Keep the floor dry. Clean up any water that spills on the floor as soon as it happens.  Remove soap buildup in the tub or shower regularly.  Attach bath mats securely with double-sided non-slip rug tape.  Do not have throw rugs and other things on the floor that can make you trip. What can I do in the bedroom?  Use night lights.  Make sure that you have a light by your bed that is easy to reach.  Do not use any sheets or blankets that are too big for your bed. They should not hang down onto the floor.  Have a firm chair that has side arms. You can use this for support while you get dressed.  Do not have throw rugs and other things on the floor that can make you trip. What can I do in the kitchen?  Clean up any spills right away.  Avoid walking on wet floors.  Keep items that you use a lot  in easy-to-reach places.  If you need to reach something above you, use a strong step stool that has a grab bar.  Keep electrical cords out of the way.  Do not use floor polish or wax that makes floors slippery. If you must use wax, use non-skid floor wax.  Do not have throw rugs and other things on the floor that can make you trip. What can I do with my stairs?  Do not leave any items on the stairs.  Make sure that there are handrails on both sides of the stairs and use them. Fix handrails that are broken or loose. Make sure that handrails are as long as the stairways.  Check any carpeting to make sure that it is firmly attached to the stairs. Fix any carpet that is loose or worn.  Avoid having throw rugs at the top or bottom of the stairs. If you do have throw rugs, attach them to the floor with carpet tape.  Make sure that you have a light switch at the  top of the stairs and the bottom of the stairs. If you do not have them, ask someone to add them for you. What else can I do to help prevent falls?  Wear shoes that:  Do not have high heels.  Have rubber bottoms.  Are comfortable and fit you well.  Are closed at the toe. Do not wear sandals.  If you use a stepladder:  Make sure that it is fully opened. Do not climb a closed stepladder.  Make sure that both sides of the stepladder are locked into place.  Ask someone to hold it for you, if possible.  Clearly mark and make sure that you can see:  Any grab bars or handrails.  First and last steps.  Where the edge of each step is.  Use tools that help you move around (mobility aids) if they are needed. These include:  Canes.  Walkers.  Scooters.  Crutches.  Turn on the lights when you go into a dark area. Replace any light bulbs as soon as they burn out.  Set up your furniture so you have a clear path. Avoid moving your furniture around.  If any of your floors are uneven, fix them.  If there are any pets around you, be aware of where they are.  Review your medicines with your doctor. Some medicines can make you feel dizzy. This can increase your chance of falling. Ask your doctor what other things that you can do to help prevent falls. This information is not intended to replace advice given to you by your health care provider. Make sure you discuss any questions you have with your health care provider. Document Released: 06/06/2009 Document Revised: 01/16/2016 Document Reviewed: 09/14/2014 Elsevier Interactive Patient Education  2017 [employer].

## 2020-11-29 ENCOUNTER — Ambulatory Visit: Payer: Medicare Other | Admitting: Family Medicine

## 2020-11-29 ENCOUNTER — Other Ambulatory Visit: Payer: Self-pay

## 2020-11-29 DIAGNOSIS — R1012 Left upper quadrant pain: Secondary | ICD-10-CM

## 2020-11-29 MED ORDER — TRAMADOL-ACETAMINOPHEN 37.5-325 MG PO TABS: 1.0000 | ORAL_TABLET | Freq: Two times a day (BID) | ORAL | 0 refills | Status: DC | PRN

## 2020-11-29 NOTE — Assessment & Plan Note (Signed)
The differential for her pain includes herpes zoster, musculoskeletal inflammation, diverticulitis, bowel ulceration.  Her CT abdomen was notable for multiple findings although none is obviously responsible for her left upper quadrant pain.  She denies epigastric or right upper quadrant pain which would be related to potential gallbladder disease.  She also did denies any significant back pain which could be related to kidney stones.  It is possible this is related to mild diverticulitis.  Her colonoscopy also showed evidence of some ulceration may be the cause of this discomfort.  I have a low suspicion for MSK etiology of this pain.  She was advised to return to her primary care provider and ask for a referral to GI for further assessment.  I have a strong suspicion this is related to her colon findings. -Tramadol refilled for 30 tabs

## 2020-11-29 NOTE — Patient Instructions (Signed)
Left side pain: We are not positive what is causing your side pain.  We are suspicious that this is related to some changes in your bowels.  The CT was overall reassuring that there is no obvious muscle or bone problem in that area.  He did have some small findings in your gallbladder and kidneys but I would not expect those to cause your side pain.  I recommend that you follow-up with your primary care provider in order to get a referral to a GI doctor for further assessment and treatment of this abdominal pain.  It may be something related to an inflammatory bowel disease condition.

## 2020-11-29 NOTE — Progress Notes (Signed)
[place] Attending Note: I have seen and examined this patient. I have discussed this patient with the resident and reviewed the assessment and plan as documented above. I agree with the resident's findings and plan. 1. Continued left-sided abdominal pain: Do not think this is musculoskeletal in origin.  Her CT scan in 2020 and the repeat one we just did both showed diverticulosis of the sigmoid colon.  She had an colonoscopy which revealed slightly abnormal area visually in the sigmoid colon.  Biopsy was negative for specific pathology.  I do believe that her pain is related to something other than the musculoskeletal system, possibly the gastrointestinal system.  She had some relief from the tramadol so we will give her another month of that.  I recommend she return to see her PCP and request further evaluation by gastroenterologist as this seems most likely to be the source of her problem.    #2.  Additionally,recent  CT scan also  revealed some diffuse circumferential gallbladder wall thickening that could be consistent with chronic cholecystitis.  Certainly that does not account for her left-sided abdominal pain but likely needs to be followed up.  Since I am recommending to her PCP to have her seen by GI, perhaps they could comment on this as well. She can follow-up with [place] as needed.  Recent CT 11/2020 MPRESSION: 1. Apparent gallbladder wall thickening with questionable adjacent mild hazy fat stranding. This may indicate underlying chronic cholecystitis. Consider further evaluation with HIDA scan as clinically indicated. 2. Bilateral nonobstructing nephrolithiasis. 3. Rectosigmoid diverticulosis without acute inflammation. 4. Fat containing umbilical hernia.  CTIMPRESSION:Sept 2020:  1. Mild wall thickening without surrounding inflammation involving the terminal ileum concerning for inflammatory or infectious terminal ileitis. 2. Chronic increase caliber of the common bile  duct. No choledocholithiasis or mass noted. 3.  Aortic Atherosclerosis (ICD10-I70.0   CollonoscopyDr Carlean Purl 04/2019 Patchy area of mucosa in the terminal ileum was slightly blue and pale in color. Biopsies were taken with a cold forceps for histology. Verification of patient identification for the specimen was done. Estimated blood loss was minimal. - Discontinuous areas of ulcerated mucosa with no stigmata of recent bleeding were present in the cecum and at the ileocecal valve. Biopsies were taken with a cold forceps for histolog

## 2020-11-29 NOTE — Progress Notes (Signed)
   PCP: Sharion Balloon, FNP  Subjective:   HPI: Patient is a 70 y.o. female here for follow-up regarding left side pain.  Left side pain [name] reports this has been an ongoing issue for about a year.  Since she was last seen in clinic about 3 weeks ago, her pain has remained the same.  She did use the tramadol with Tylenol and found it helpful but she is now out of that medicine.  She did have a CT performed.  She currently describes the pain as a burning sensation over her left ribs and upper abdomen.  It can last for hours and she notices this most at night when it keeps her awake.  She does not associate this pain with activity or with eating.  She has not noted any significant changes in her bowel movements although she remains utterly constipated.  She has not had any blood in her stool since last visit.  She also denies nausea, vomiting.  CT scan Performed 11/22/2020 was notable for some gallbladder wall thickening, nonobstructive nephrolithiasis, diverticulosis in the rectosigmoid region and a small, fat-containing umbilical hernia.  Colonoscopy Performed 04/2019 was remarkable for some mucosal ulcerations, diverticulosis and internal hemorrhoids.  Review of Systems:  Per HPI.   [place], medications and smoking status reviewed.      Objective:  Physical Exam:   General: Alert and cooperative and appears to be in no acute distress Cardio: Normal S1 and S2, no S3 or S4. Rhythm is regular. No murmurs or rubs.   Pulm: Clear to auscultation bilaterally, no crackles, wheezing, or diminished breath sounds. Normal respiratory effort Abdomen: Bowel sounds normal. Abdomen soft with mild tenderness to palpation over the left upper quadrant roughly along the mid axillary line involving ribs 9-12.  No evidence of skin changes.  No erythema, purulence, vesicles.  No tenderness to palpation over the right upper quadrant or epigastrium. Extremities: No peripheral edema. Warm/ well perfused.   Strong radial pulses. Neuro: Cranial nerves grossly intact   Assessment & Plan:  Left upper quadrant pain The differential for her pain includes herpes zoster, musculoskeletal inflammation, diverticulitis, bowel ulceration.  Her CT abdomen was notable for multiple findings although none is obviously responsible for her left upper quadrant pain.  She denies epigastric or right upper quadrant pain which would be related to potential gallbladder disease.  She also did denies any significant back pain which could be related to kidney stones.  It is possible this is related to mild diverticulitis.  Her colonoscopy also showed evidence of some ulceration may be the cause of this discomfort.  I have a low suspicion for MSK etiology of this pain.  She was advised to return to her primary care provider and ask for a referral to GI for further assessment.  I have a strong suspicion this is related to her colon findings. -Tramadol refilled for 30 tabs    Matilde Haymaker, MD PGY-3 11/29/2020 9:15 AM

## 2020-12-24 ENCOUNTER — Telehealth: Payer: Self-pay | Admitting: Family

## 2020-12-24 ENCOUNTER — Other Ambulatory Visit: Payer: Self-pay | Admitting: Family

## 2020-12-24 DIAGNOSIS — K819 Cholecystitis, unspecified: Secondary | ICD-10-CM

## 2020-12-24 DIAGNOSIS — E785 Hyperlipidemia, unspecified: Secondary | ICD-10-CM

## 2020-12-24 NOTE — Telephone Encounter (Signed)
Pt was sent to get CT scan with Dorcas Mcmurray and was told that she needs to see a gastrologist but has not heard anything since. Please advise on next steps

## 2020-12-24 NOTE — Telephone Encounter (Signed)
Patient would like 

## 2020-12-24 NOTE — Telephone Encounter (Signed)
Referral to GI placed

## 2020-12-31 ENCOUNTER — Telehealth: Payer: Self-pay | Admitting: Family

## 2020-12-31 NOTE — Telephone Encounter (Signed)
Pt calling about referral to gastrologist

## 2021-01-23 ENCOUNTER — Other Ambulatory Visit: Payer: Self-pay | Admitting: Physician Assistant

## 2021-01-23 ENCOUNTER — Ambulatory Visit: Payer: Medicare Other | Admitting: Physician Assistant

## 2021-01-23 ENCOUNTER — Encounter: Payer: Self-pay | Admitting: Physician Assistant

## 2021-01-23 VITALS — BP 110/60 | HR 60 | Ht 64.0 in | Wt 162.0 lb

## 2021-01-23 DIAGNOSIS — K5900 Constipation, unspecified: Secondary | ICD-10-CM | POA: Diagnosis not present

## 2021-01-23 DIAGNOSIS — R1012 Left upper quadrant pain: Secondary | ICD-10-CM | POA: Diagnosis not present

## 2021-01-23 DIAGNOSIS — M940 Chondrocostal junction syndrome [Tietze]: Secondary | ICD-10-CM

## 2021-01-23 MED ORDER — MELOXICAM 5 MG PO CAPS: 5.0000 mg | ORAL_CAPSULE | Freq: Every day | ORAL | 0 refills | Status: DC

## 2021-01-23 NOTE — Progress Notes (Signed)
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 Carlean Purl, who presents clinic today with a complaint of left upper quadrant abdominal pain and constipation.    05/10/2019 office visit with Alonza Bogus, PA-C.  At that time discussed lower abdominal pain particularly on the right side as well as some diarrhea and dark stools.  Recent CT with thickening of the terminal ileum.  She was given 7 days of Augmentin.  She is scheduled for repeat colonoscopy with ileoscopy.    05/15/2019 colonoscopy done for abdominal pain in the right lower quadrant abnormal CT of the GI tract with thickened terminal ileum with slightly blue and pale in color mucosa in the terminal ileum, mucosal ulceration in the cecum and terminal ileum and diverticulosis in left colon as well as internal hemorrhoids.  Was recommend that the patient continue dicyclomine but try prior to meals.  Biopsy showed acute colon ulceration and negative TI biopsies.  She was better at that point with no diarrhea and still using dicyclomine.  She is continued on as needed dicyclomine.  It was discussed that if she had any persistent issues then would recommend a CT angio of the abdominal vessels.  There are some question about whether or not she had an ischemic event that colonoscopy was on the tail end of.  Dr. Carlean Purl said she could follow with him.    11/22/2020 patient had a CT the abdomen pelvis with contrast which showed apparent gallbladder wall thickening with questionable adjacent mild hazy fat stranding which may indicate underlying chronic cholecystitis.  Further eval with HIDA scan was recommended as indicated.  Bilateral nonobstructing nephrolithiasis, rectosigmoid diverticulosis without acute inflammation and fat-containing umbilical hernia.    11/29/2020 patient seen by her PCP and at that time discussed  some left upper quadrant pain.  It was thought this was possibly related to the ulcers seen at time of her colonoscopy.  She was given tramadol refill.    Today, the patient presents to clinic and tells me that for the past 6 to 7 months she has had a 7/10 pain in her left upper quadrant.  Tells me that the only time this does not hurt is if she lays down and puts multiple pillows under her back and over the area and eventually she will fall asleep.  Otherwise it is constantly there and definitely worse if she stands up or changes position.  She has tried Tramadol which makes no difference to her symptoms.  Denies any increase in coughing, twisting or pulling.    Also discusses that her bowel movements have been mostly hard and "black" sometimes she will go for days between a bowel movement.  In the past has tried MiraLAX occasionally which "does not really worked".    Denies fever, chills, weight loss or bright red blood in her stool.     Past Medical History:  Diagnosis Date  . CVA (cerebral vascular accident) ([place])   . Gastritis   . Headache   . Hyperlipidemia   . Hypertension     Past Surgical History:  Procedure Laterality Date  . ELBOW SURGERY Right     Current Outpatient Medications  Medication Sig Dispense Refill  . amLODipine (NORVASC) 5 MG tablet Take 1 tablet (5 mg total) by mouth daily. 90 tablet 3  . aspirin EC 81 MG tablet Take 1 tablet (  81 mg total) by mouth daily. 90 tablet 1  . atorvastatin (LIPITOR) 20 MG tablet TAKE 1 TABLET BY MOUTH ONCE DAILY AT 6 PM . APPOINTMENT REQUIRED FOR FUTURE REFILLS 30 tablet 4  . meloxicam (MOBIC) 7.5 MG tablet TAKE 1 TABLET BY MOUTH EVERY 12 HOURS WITH FOOD AS NEEDED FOR PAIN 60 tablet 2  . metoprolol tartrate (LOPRESSOR) 25 MG tablet Take 1 tablet (25 mg total) by mouth 2 (two) times daily. 180 tablet 1  . traMADol-acetaminophen (ULTRACET) 37.5-325 MG tablet Take 1 tablet by mouth 2 (two) times daily as needed. For abdominal pain 30 tablet 0    No current facility-administered medications for this visit.    Allergies as of 01/23/2021  . (No Known Allergies)    Family History  Problem Relation Age of Onset  . Hypertension Mother   . Heart disease Mother   . Heart disease Father        MI  . Stomach cancer Maternal Aunt   . Stomach cancer Maternal Grandmother   . Colon cancer Neg Hx   . Esophageal cancer Neg Hx   . Rectal cancer Neg Hx     Social History   Socioeconomic History  . Marital status: Single    Spouse name: Not on file  . Number of children: Not on file  . Years of education: Not on file  . Highest education level: Not on file  Occupational History    Employer: [employer]  Tobacco Use  . Smoking status: Never Smoker  . Smokeless tobacco: Never Used  Vaping Use  . Vaping Use: Never used  Substance and Sexual Activity  . Alcohol use: Yes    Comment: 1 drink couple times of year  . Drug use: No  . Sexual activity: Never  Other Topics Concern  . Not on file  Social History Narrative   Stays with her mother and disabled brother   Social Determinants of Health   Financial Resource Strain: Low Risk   . Difficulty of Paying Living Expenses: Not hard at all  Food Insecurity: No Food Insecurity  . Worried About Charity fundraiser in the Last Year: Never true  . Ran Out of Food in the Last Year: Never true  Transportation Needs: No Transportation Needs  . Lack of Transportation (Medical): No  . Lack of Transportation (Non-Medical): No  Physical Activity: Insufficiently Active  . Days of Exercise per Week: 7 days  . Minutes of Exercise per Session: 20 min  Stress: No Stress Concern Present  . Feeling of Stress : Not at all  Social Connections: Unknown  . Frequency of Communication with Friends and Family: More than three times a week  . Frequency of Social Gatherings with Friends and Family: Twice a week  . Attends Religious Services: More than 4 times per year  . Active Member of Clubs or  Organizations: Yes  . Attends Archivist Meetings: More than 4 times per year  . Marital Status: Not on file  Intimate Partner Violence: Not At Risk  . Fear of Current or Ex-Partner: No  . Emotionally Abused: No  . Physically Abused: No  . Sexually Abused: No    Review of Systems:    Constitutional: No weight loss, fever or chills Cardiovascular: No chest pain Respiratory: No SOB  Gastrointestinal: See HPI and otherwise negative   Physical Exam:  Vital signs: BP 110/60   Pulse 60   Ht 5\' 4"  (1.626 m)   Wt  162 lb (73.5 kg)   BMI 27.81 kg/m   Constitutional:   Pleasant AA female appears to be in NAD, Well developed, Well nourished, alert and cooperative Respiratory: Respirations even and unlabored. Lungs clear to auscultation bilaterally.   No wheezes, crackles, or rhonchi.  Cardiovascular: Normal S1, S2. No MRG. Regular rate and rhythm. No peripheral edema, cyanosis or pallor.  Gastrointestinal:  Soft, nondistended, may be mild TTP in the left upper quadrant, but mostly superficially. No rebound or guarding. Normal bowel sounds. No appreciable masses or hepatomegaly. Rectal:  Not performed.  Msk:  Symmetrical without gross deformities. Without edema, no deformity or joint abnormality. +ttp over palpation of lower left rib cage Psychiatric: Oriented to person, place and time. Demonstrates good judgement and reason without abnormal affect or behaviors.  RELEVANT LABS AND IMAGING: CBC    Component Value Date/Time   WBC 5.3 10/25/2020 1054   WBC 4.8 05/06/2019 0938   RBC 4.26 10/25/2020 1054   RBC 4.27 05/06/2019 0938   HGB 13.0 10/25/2020 1054   HCT 38.8 10/25/2020 1054   PLT 273 10/25/2020 1054   MCV 91 10/25/2020 1054   MCH 30.5 10/25/2020 1054   MCH 30.2 05/06/2019 0938   MCHC 33.5 10/25/2020 1054   MCHC 31.8 05/06/2019 0938   RDW 12.7 10/25/2020 1054   LYMPHSABS 1.7 10/25/2020 1054   MONOABS 0.5 05/01/2019 1301   EOSABS 0.2 10/25/2020 1054   BASOSABS 0.0  10/25/2020 1054    CMP     Component Value Date/Time   NA 141 10/25/2020 1054   K 4.4 10/25/2020 1054   CL 103 10/25/2020 1054   CO2 25 10/25/2020 1054   GLUCOSE 93 10/25/2020 1054   GLUCOSE 94 05/06/2019 0938   BUN 8 10/25/2020 1054   CREATININE 0.95 10/25/2020 1054   CALCIUM 9.3 10/25/2020 1054   PROT 7.0 10/25/2020 1054   ALBUMIN 4.4 10/25/2020 1054   AST 14 10/25/2020 1054   ALT 9 10/25/2020 1054   ALKPHOS 120 10/25/2020 1054   BILITOT 0.5 10/25/2020 1054   GFRNONAA >60 05/06/2019 0938   GFRAA >60 05/06/2019 8546    Assessment: 1.  Left upper quadrant pain: This is much worse over the patient's rib cage, suspect costochondritis, there is some small amount of pain in her left upper abdomen, but this could be referred, recent colonoscopy in September 2020 with ulcerations in the right lower quadrant/terminal ileum cecum area, do not believe these are causing patient's pain; most likely musculoskeletal 2.  Constipation: Patient reports dark "black" stools which are hard and infrequent  Plan: 1.  Prescribed Meloxicam capsules 5 mg daily x7 days. 2.  Recommend the patient use hot compresses over this area 3.  Encouraged the patient to use MiraLAX on a daily basis to help with constipation, it is possible that buildup from gas in the left upper quadrant is adding to her symptoms. 4.  Patient to follow in clinic as needed with [place].  Told her to call and let us know how she is doing after Meloxicam.  Ellouise Newer, PA-C [facility] 01/23/2021, 3:14 PM  Cc: Sharion Balloon, FNP

## 2021-01-23 NOTE — Patient Instructions (Addendum)
If you are age 68 or older, your body mass index should be between 23-30. Your Body mass index is 27.81 kg/m. If this is out of the aforementioned range listed, please consider follow up with your Primary Care Provider.  If you are age 93 or younger, your body mass index should be between 19-25. Your Body mass index is 27.81 kg/m. If this is out of the aformentioned range listed, please consider follow up with your Primary Care Provider.   Start Miralax once daily.  We have sent the following medications to your pharmacy for you to pick up at your convenience: Meloxicam 5 mg.  Call if the doesn't help or if anything changes.  Use a warm compress as needed.  The [facility] providers would like to encourage you to use [facility] to communicate with providers for non-urgent requests or questions.  Due to long hold times on the telephone, sending your provider a message by [facility] may be a faster and more efficient way to get a response.  Please allow 48 business hours for a response.  Please remember that this is for non-urgent requests.   Thank you for choosing me and [facility].  Ellouise Newer , PA-C

## 2021-01-24 ENCOUNTER — Telehealth: Payer: Self-pay | Admitting: Physician Assistant

## 2021-01-24 NOTE — Telephone Encounter (Signed)
Inbound call from pt stating the medication Amlodipine is making her dizzy and she is off balance. She was wondering can something else be prescribed to her. Please give her a call. Thank you

## 2021-01-24 NOTE — Telephone Encounter (Signed)
Returned patients call and let her know we did not Prescribe Amlodipine and to call her PCP to inform them that the medications was making her dizzy. Patient asked what Krista Finley Prescribed and I let her know that it was Meloxicam. Patient asked what Amlodipine was for and I informed her that it was for blood pressure. Patient had no other question at the end of call.

## 2021-02-06 ENCOUNTER — Ambulatory Visit (INDEPENDENT_AMBULATORY_CARE_PROVIDER_SITE_OTHER): Payer: Medicare Other | Admitting: Family Medicine

## 2021-02-06 ENCOUNTER — Other Ambulatory Visit: Payer: Self-pay

## 2021-02-06 ENCOUNTER — Encounter: Payer: Self-pay | Admitting: Family Medicine

## 2021-02-06 DIAGNOSIS — J029 Acute pharyngitis, unspecified: Secondary | ICD-10-CM

## 2021-02-06 DIAGNOSIS — R059 Cough, unspecified: Secondary | ICD-10-CM

## 2021-02-06 DIAGNOSIS — R52 Pain, unspecified: Secondary | ICD-10-CM

## 2021-02-06 NOTE — Progress Notes (Signed)
   Virtual Visit  Note Due to COVID-19 pandemic this visit was conducted virtually. This visit type was conducted due to national recommendations for restrictions regarding the COVID-19 Pandemic (e.g. social distancing, sheltering in place) in an effort to limit this patient's exposure and mitigate transmission in our community. All issues noted in this document were discussed and addressed.  A physical exam was not performed with this format.  I connected with Krista Finley on 02/06/21 at [phone] by telephone and verified that I am speaking with the correct person using two identifiers. Krista Finley is currently located at home and no one is currently with her during the visit. The provider, Gwenlyn Perking, FNP is located in their office at time of visit.  I discussed the limitations, risks, security and privacy concerns of performing an evaluation and management service by telephone and the availability of in person appointments. I also discussed with the patient that there may be a patient responsible charge related to this service. The patient expressed understanding and agreed to proceed.  CC: sore throat  History and Present Illness:  HPI Malley Finley reports sore throat, headaches, chills, body aches, and cough for 2 days. She denies congestion, fever, nausea, vomiting, diarrhea, chest pain, or shortness of breath. She had a negative home Covid test 2 days ago. She has been vaccinated and boosted. She denies known exposure. She has tried nyquil with no improvement.     ROS As per HPI.   Observations/Objective: Alert and oriented x 3. Able to speak in full sentences without difficulty.    Assessment and Plan: Krista was seen today for sore throat.  Diagnoses and all orders for this visit:  Cough Body aches Sore throat Patient will come by the office for Covid testing. Quarantine until results, discussed possibility of antiviral treatment if positive results as  she has a hx of HTN. Mucinex for cough, congestion. Tylenol for pain. Rest, stay well hydrated. Discussed return precautions and when to seek emergency care.  -     Novel Coronavirus, NAA ([facility]); Future    Follow Up Instructions: Return to office for new or worsening symptoms.    I discussed the assessment and treatment plan with the patient. The patient was provided an opportunity to ask questions and all were answered. The patient agreed with the plan and demonstrated an understanding of the instructions.   The patient was advised to call back or seek an in-person evaluation if the symptoms worsen or if the condition fails to improve as anticipated.  The above assessment and management plan was discussed with the patient. The patient verbalized understanding of and has agreed to the management plan. Patient is aware to call the clinic if symptoms persist or worsen. Patient is aware when to return to the clinic for a follow-up visit. Patient educated on when it is appropriate to go to the emergency department.   Time call ended:  0829  I provided 12 minutes of  non face-to-face time during this encounter.    Gwenlyn Perking, FNP

## 2021-02-07 LAB — SARS-COV-2, NAA 2 DAY TAT

## 2021-02-07 LAB — NOVEL CORONAVIRUS, NAA: SARS-CoV-2, NAA: DETECTED — AB

## 2021-02-11 ENCOUNTER — Other Ambulatory Visit: Payer: Self-pay | Admitting: Family Medicine

## 2021-02-11 DIAGNOSIS — U071 COVID-19: Secondary | ICD-10-CM

## 2021-02-11 MED ORDER — MOLNUPIRAVIR EUA 200MG CAPSULE: 4.0000 | ORAL_CAPSULE | Freq: Two times a day (BID) | ORAL | 0 refills | Status: AC

## 2021-02-26 ENCOUNTER — Encounter: Payer: Self-pay | Admitting: Nurse Practitioner

## 2021-02-26 ENCOUNTER — Ambulatory Visit (INDEPENDENT_AMBULATORY_CARE_PROVIDER_SITE_OTHER): Payer: Medicare Other | Admitting: Nurse Practitioner

## 2021-02-26 DIAGNOSIS — R059 Cough, unspecified: Secondary | ICD-10-CM | POA: Diagnosis not present

## 2021-02-26 MED ORDER — BENZONATATE 100 MG PO CAPS
100.0000 mg | ORAL_CAPSULE | Freq: Three times a day (TID) | ORAL | 0 refills | Status: DC | PRN
Start: 2021-02-26 — End: 2021-05-30

## 2021-02-26 NOTE — Progress Notes (Signed)
   Virtual Visit  Note Due to COVID-19 pandemic this visit was conducted virtually. This visit type was conducted due to national recommendations for restrictions regarding the COVID-19 Pandemic (e.g. social distancing, sheltering in place) in an effort to limit this patient's exposure and mitigate transmission in our community. All issues noted in this document were discussed and addressed.  A physical exam was not performed with this format.  I connected with Krista Finley on 02/26/21 at 5 PM by telephone and verified that I am speaking with the correct person using two identifiers. Krista Finley is currently located at home during visit. The provider, Ivy Lynn, NP is located in their office at time of visit.  I discussed the limitations, risks, security and privacy concerns of performing an evaluation and management service by telephone and the availability of in person appointments. I also discussed with the patient that there may be a patient responsible charge related to this service. The patient expressed understanding and agreed to proceed.   History and Present Illness:  Cough This is a recurrent problem. The current episode started 1 to 4 weeks ago. The problem occurs constantly. The cough is Non-productive. Pertinent negatives include no chest pain, chills, ear congestion, rash, sore throat or shortness of breath. Nothing aggravates the symptoms. She has tried OTC cough suppressant for the symptoms. The treatment provided no relief.  Patient recently tested positive for COVID-19 3 weeks ago and has since had unresolved cough.   Review of Systems  Constitutional:  Negative for chills.  HENT:  Negative for sore throat.   Respiratory:  Positive for cough. Negative for shortness of breath.   Cardiovascular:  Negative for chest pain.  Skin:  Negative for rash.  All other systems reviewed and are negative.   Observations/Objective: Televisit patient not in  distress.  Assessment and Plan: Provided education to patient that COVID-19 cough sometimes takes a while to resolve, cough drops, benzonatate 100 mg tablet by mouth 4 times daily, increase hydration  Follow Up Instructions: Follow-up with worsening unresolved symptoms.    I discussed the assessment and treatment plan with the patient. The patient was provided an opportunity to ask questions and all were answered. The patient agreed with the plan and demonstrated an understanding of the instructions.   The patient was advised to call back or seek an in-person evaluation if the symptoms worsen or if the condition fails to improve as anticipated.  The above assessment and management plan was discussed with the patient. The patient verbalized understanding of and has agreed to the management plan. Patient is aware to call the clinic if symptoms persist or worsen. Patient is aware when to return to the clinic for a follow-up visit. Patient educated on when it is appropriate to go to the emergency department.   Time call ended: 5:10 PM  I provided 10 minutes of  non face-to-face time during this encounter.    Ivy Lynn, NP

## 2021-04-11 ENCOUNTER — Other Ambulatory Visit: Payer: Self-pay

## 2021-04-11 ENCOUNTER — Ambulatory Visit (INDEPENDENT_AMBULATORY_CARE_PROVIDER_SITE_OTHER): Payer: Medicare Other | Admitting: *Deleted

## 2021-04-11 DIAGNOSIS — Z111 Encounter for screening for respiratory tuberculosis: Secondary | ICD-10-CM

## 2021-04-14 ENCOUNTER — Ambulatory Visit: Payer: Medicare Other

## 2021-04-14 ENCOUNTER — Other Ambulatory Visit: Payer: Self-pay

## 2021-04-14 DIAGNOSIS — Z111 Encounter for screening for respiratory tuberculosis: Secondary | ICD-10-CM

## 2021-04-14 LAB — TB SKIN TEST
Induration: 0 mm
TB Skin Test: NEGATIVE

## 2021-04-14 NOTE — Progress Notes (Signed)
Patient here today to have TB skin test results read.  TB test was negative.

## 2021-05-09 ENCOUNTER — Other Ambulatory Visit: Payer: Self-pay | Admitting: Family Medicine

## 2021-05-12 ENCOUNTER — Other Ambulatory Visit: Payer: Self-pay | Admitting: Family

## 2021-05-27 ENCOUNTER — Other Ambulatory Visit: Payer: Self-pay | Admitting: Family

## 2021-05-27 DIAGNOSIS — I10 Essential (primary) hypertension: Secondary | ICD-10-CM

## 2021-05-30 ENCOUNTER — Encounter: Payer: Self-pay | Admitting: Family Medicine

## 2021-05-30 ENCOUNTER — Ambulatory Visit (INDEPENDENT_AMBULATORY_CARE_PROVIDER_SITE_OTHER): Payer: Medicare Other | Admitting: Family Medicine

## 2021-05-30 ENCOUNTER — Other Ambulatory Visit: Payer: Self-pay

## 2021-05-30 VITALS — BP 140/73 | HR 67 | Temp 97.8°F | Ht 64.0 in | Wt 161.1 lb

## 2021-05-30 DIAGNOSIS — L600 Ingrowing nail: Secondary | ICD-10-CM

## 2021-05-30 MED ORDER — CEPHALEXIN 500 MG PO CAPS: 500.0000 mg | ORAL_CAPSULE | Freq: Two times a day (BID) | ORAL | 0 refills | Status: AC

## 2021-05-30 NOTE — Patient Instructions (Signed)
Ingrown Toenail An ingrown toenail occurs when the corner or sides of a toenail grow into the surrounding skin. This causes discomfort and pain. The big toe is most commonly affected, but any of the toes can be affected. If an ingrown toenail is nottreated, it can become infected. What are the causes? This condition may be caused by: Wearing shoes that are too small or tight. An injury, such as stubbing your toe or having your toe stepped on. Improper cutting or care of your toenails. Having nail or foot abnormalities that were present from birth (congenital abnormalities), such as having a nail that is too big for your toe. What increases the risk? The following factors may make you more likely to develop ingrown toenails: Age. Nails tend to get thicker with age, so ingrown nails are more common among older people. Cutting your toenails incorrectly, such as cutting them very short or cutting them unevenly. An ingrown toenail is more likely to get infected if you have: Diabetes. Blood flow (circulation) problems. What are the signs or symptoms? Symptoms of an ingrown toenail may include: Pain, soreness, or tenderness. Redness. Swelling. Hardening of the skin that surrounds the toenail. Signs that an ingrown toenail may be infected include: Fluid or pus. Symptoms that get worse instead of better. How is this diagnosed? An ingrown toenail may be diagnosed based on your medical history, your symptoms, and a physical exam. If you have fluid or blood coming from your toenail, a sample may be collected to test for the specific type of bacteriathat is causing the infection. How is this treated? Treatment depends on how severe your ingrown toenail is. You may be able to care for your toenail at home. If you have an infection, you may be prescribed antibiotic medicines. If you have fluid or pus draining from your toenail, your health care provider may drain it. If you have trouble walking, you  may be given crutches to use. If you have a severe or infected ingrown toenail, you may need a procedure to remove part or all of the nail. Follow these instructions at home: Foot care  Do not pick at your toenail or try to remove it yourself. Soak your foot in warm, soapy water. Do this for 20 minutes, 3 times a day, or as often as told by your health care provider. This helps to keep your toe clean and keep your skin soft. Wear shoes that fit well and are not too tight. Your health care provider may recommend that you wear open-toed shoes while you heal. Trim your toenails regularly and carefully. Cut your toenails straight across to prevent injury to the skin at the corners of the toenail. Do not cut your nails in a curved shape. Keep your feet clean and dry to help prevent infection.  Medicines Take over-the-counter and prescription medicines only as told by your health care provider. If you were prescribed an antibiotic, take it as told by your health care provider. Do not stop taking the antibiotic even if you start to feel better. Activity Return to your normal activities as told by your health care provider. Ask your health care provider what activities are safe for you. Avoid activities that cause pain. General instructions If your health care provider told you to use crutches to help you move around, use them as instructed. Keep all follow-up visits as told by your health care provider. This is important. Contact a health care provider if: You have more redness, swelling, pain, or   other symptoms that do not improve with treatment. You have fluid, blood, or pus coming from your toenail. Get help right away if: You have a red streak on your skin that starts at your foot and spreads up your leg. You have a fever. Summary An ingrown toenail occurs when the corner or sides of a toenail grow into the surrounding skin. This causes discomfort and pain. The big toe is most commonly  affected, but any of the toes can be affected. If an ingrown toenail is not treated, it can become infected. Fluid or pus draining from your toenail is a sign of infection. Your health care provider may need to drain it. You may be given antibiotics to treat the infection. Trimming your toenails regularly and properly can help you prevent an ingrown toenail. This information is not intended to replace advice given to you by your health care provider. Make sure you discuss any questions you have with your healthcare provider. Document Revised: 12/02/2018 Document Reviewed: 04/28/2017 Elsevier Patient Education  2021 Elsevier Inc.  

## 2021-05-30 NOTE — Progress Notes (Signed)
Acute Office Visit  Subjective:    Patient ID: Krista Finley, female    DOB: 1950-10-08, 70 y.o.   MRN: [id number]  Chief Complaint  Patient presents with   Ingrown Toenail    HPI Patient is in today for ingrown toenails on both great toes for about 2 weeks. She had a pedicure where they dug out the nails. This improved the pain until about 2 days ago. She does not trim her own nails. She denies drainage, fever, or chills. She has not tried any remedies.   Past Medical History:  Diagnosis Date   CVA (cerebral vascular accident) ([place])    Gastritis    Headache    Hyperlipidemia    Hypertension     Past Surgical History:  Procedure Laterality Date   ELBOW SURGERY Right     Family History  Problem Relation Age of Onset   Hypertension Mother    Heart disease Mother    Heart disease Father        MI   Stomach cancer Maternal Aunt    Stomach cancer Maternal Grandmother    Colon cancer Neg Hx    Esophageal cancer Neg Hx    Rectal cancer Neg Hx     Social History   Socioeconomic History   Marital status: Single    Spouse name: Not on file   Number of children: Not on file   Years of education: Not on file   Highest education level: Not on file  Occupational History    Employer: [employer]  Tobacco Use   Smoking status: Never   Smokeless tobacco: Never  Vaping Use   Vaping Use: Never used  Substance and Sexual Activity   Alcohol use: Yes    Comment: 1 drink couple times of year   Drug use: No   Sexual activity: Never  Other Topics Concern   Not on file  Social History Narrative   Stays with her mother and disabled brother   Social Determinants of Health   Financial Resource Strain: Low Risk    Difficulty of Paying Living Expenses: Not hard at all  Food Insecurity: No Food Insecurity   Worried About Charity fundraiser in the Last Year: Never true   [place] in the Last Year: Never true  Transportation Needs: No Transportation Needs   Lack  of Transportation (Medical): No   Lack of Transportation (Non-Medical): No  Physical Activity: Insufficiently Active   Days of Exercise per Week: 7 days   Minutes of Exercise per Session: 20 min  Stress: No Stress Concern Present   Feeling of Stress : Not at all  Social Connections: Unknown   Frequency of Communication with Friends and Family: More than three times a week   Frequency of Social Gatherings with Friends and Family: Twice a week   Attends Religious Services: More than 4 times per year   Active Member of [employer] or Organizations: Yes   Attends Music therapist: More than 4 times per year   Marital Status: Not on file  Intimate Partner Violence: Not At Risk   Fear of Current or Ex-Partner: No   Emotionally Abused: No   Physically Abused: No   Sexually Abused: No    Outpatient Medications Prior to Visit  Medication Sig Dispense Refill   amLODipine (NORVASC) 5 MG tablet Take 1 tablet (5 mg total) by mouth daily. 90 tablet 3   atorvastatin (LIPITOR) 20 MG tablet TAKE 1  TABLET BY MOUTH ONCE DAILY AT 6 PM . APPOINTMENT REQUIRED FOR FUTURE REFILLS 30 tablet 4   EQ ASPIRIN ADULT LOW DOSE 81 MG EC tablet Take 1 tablet by mouth once daily 90 tablet 0   Meloxicam 5 MG CAPS TAKE 1 CAPSULE BY MOUTH ONCE DAILY FOR 7 DAYS 7 capsule 0   metoprolol tartrate (LOPRESSOR) 25 MG tablet Take 1 tablet (25 mg total) by mouth 2 (two) times daily. (NEEDS TO BE SEEN BEFORE NEXT REFILL) 60 tablet 0   traMADol-acetaminophen (ULTRACET) 37.5-325 MG tablet Take 1 tablet by mouth 2 (two) times daily as needed. For abdominal pain 30 tablet 0   benzonatate (TESSALON PERLES) 100 MG capsule Take 1 capsule (100 mg total) by mouth 3 (three) times daily as needed for cough. 30 capsule 0   No facility-administered medications prior to visit.    No Known Allergies  Review of Systems As per HPI.    Objective:    Physical Exam Vitals and nursing note reviewed.  Constitutional:      General:  She is not in acute distress.    Appearance: She is not ill-appearing, toxic-appearing or diaphoretic.  Pulmonary:     Effort: Pulmonary effort is normal. No respiratory distress.  Feet:     Right foot:     Toenail Condition: Right toenails are ingrown.     Left foot:     Toenail Condition: Left toenails are ingrown.     Comments: Ingrown toenails with surround erythema and warmth to bilateral great toes.  Neurological:     Mental Status: She is alert and oriented to person, place, and time.  Psychiatric:        Behavior: Behavior normal.    BP 140/73   Pulse 67   Temp 97.8 F (36.6 C) (Temporal)   Ht '5\' 4"'  (1.626 m)   Wt 161 lb 2 oz (73.1 kg)   BMI 27.66 kg/m  Wt Readings from Last 3 Encounters:  05/30/21 161 lb 2 oz (73.1 kg)  01/23/21 162 lb (73.5 kg)  11/29/20 161 lb (73 kg)    Health Maintenance Due  Topic Date Due   Zoster Vaccines- Shingrix (1 of 2) Never done   COVID-19 Vaccine (4 - Booster for Pfizer series) 12/21/2020   INFLUENZA VACCINE  Never done    There are no preventive care reminders to display for this patient.   Lab Results  Component Value Date   TSH 0.807 09/09/2017   Lab Results  Component Value Date   WBC 5.3 10/25/2020   HGB 13.0 10/25/2020   HCT 38.8 10/25/2020   MCV 91 10/25/2020   PLT 273 10/25/2020   Lab Results  Component Value Date   NA 141 10/25/2020   K 4.4 10/25/2020   CO2 25 10/25/2020   GLUCOSE 93 10/25/2020   BUN 8 10/25/2020   CREATININE 0.95 10/25/2020   BILITOT 0.5 10/25/2020   ALKPHOS 120 10/25/2020   AST 14 10/25/2020   ALT 9 10/25/2020   PROT 7.0 10/25/2020   ALBUMIN 4.4 10/25/2020   CALCIUM 9.3 10/25/2020   ANIONGAP 8 05/06/2019   EGFR 66 10/25/2020   GFR 99.84 06/20/2013   Lab Results  Component Value Date   CHOL 188 10/25/2020   Lab Results  Component Value Date   HDL 77 10/25/2020   Lab Results  Component Value Date   LDLCALC 99 10/25/2020   Lab Results  Component Value Date   TRIG 63  10/25/2020   Lab  Results  Component Value Date   CHOLHDL 2.4 10/25/2020   Lab Results  Component Value Date   HGBA1C 5.5 04/26/2015       Assessment & Plan:   Krista Finley was seen today for ingrown toenail.  Diagnoses and all orders for this visit:  Ingrown toenail of both feet Keflex as below. Discussed symptomatic care and prevention. Discussed podiatry referral if symptoms persist.  -     cephALEXin (KEFLEX) 500 MG capsule; Take 1 capsule (500 mg total) by mouth 2 (two) times daily for 7 days.  Return to office for new or worsening symptoms, or if symptoms persist.   The patient indicates understanding of these issues and agrees with the plan.  Gwenlyn Perking, FNP

## 2021-06-02 ENCOUNTER — Ambulatory Visit: Payer: Medicare Other | Admitting: Family Medicine

## 2021-06-04 ENCOUNTER — Other Ambulatory Visit: Payer: Self-pay | Admitting: Family

## 2021-06-04 DIAGNOSIS — E785 Hyperlipidemia, unspecified: Secondary | ICD-10-CM

## 2021-06-16 ENCOUNTER — Telehealth: Payer: Self-pay | Admitting: Family

## 2021-06-16 DIAGNOSIS — L600 Ingrowing nail: Secondary | ICD-10-CM

## 2021-06-16 NOTE — Telephone Encounter (Signed)
REFERRAL REQUEST Telephone Note  Have you been seen at our office for this problem? yes (Advise that they may need an appointment with their PCP before a referral can be done)  Reason for Referral: ingrown toenail not any better Referral discussed with patient: yes Best contact number of patient for referral team:   [phone] Has patient been seen by a specialist for this issue before:  Patient provider preference for referral: dr Irving Shows Patient location preference for referral:    Patient notified that referrals can take up to a week or longer to process. If they haven't heard anything within a week they should call back and speak with the referral department.

## 2021-06-16 NOTE — Telephone Encounter (Signed)
Patient seen Krista Finley - please advise

## 2021-06-17 ENCOUNTER — Telehealth: Payer: Self-pay | Admitting: Family

## 2021-06-17 NOTE — Telephone Encounter (Signed)
Attempted to contact patient - NA °

## 2021-06-17 NOTE — Telephone Encounter (Signed)
Called and requested info given

## 2021-06-17 NOTE — Telephone Encounter (Signed)
Referral to podiatry placed

## 2021-07-09 ENCOUNTER — Other Ambulatory Visit: Payer: Self-pay | Admitting: Family

## 2021-07-09 DIAGNOSIS — E785 Hyperlipidemia, unspecified: Secondary | ICD-10-CM

## 2021-07-09 NOTE — Telephone Encounter (Signed)
[place]. NTBS 30 days given 06/04/21

## 2021-07-11 NOTE — Telephone Encounter (Signed)
Scheduled apt 07/28/2021

## 2021-07-16 ENCOUNTER — Other Ambulatory Visit: Payer: Self-pay | Admitting: Family

## 2021-07-16 ENCOUNTER — Telehealth: Payer: Self-pay | Admitting: Family

## 2021-07-16 DIAGNOSIS — E785 Hyperlipidemia, unspecified: Secondary | ICD-10-CM

## 2021-07-16 MED ORDER — ATORVASTATIN CALCIUM 20 MG PO TABS: ORAL_TABLET | ORAL | 0 refills | Status: DC

## 2021-07-16 NOTE — Telephone Encounter (Signed)
Lmtcb.

## 2021-07-16 NOTE — Telephone Encounter (Signed)
Spoke with patient she never picked up 30 day supply sent in October will send in 30 day supply until her appt.

## 2021-07-19 ENCOUNTER — Other Ambulatory Visit: Payer: Self-pay | Admitting: Family

## 2021-07-19 DIAGNOSIS — I10 Essential (primary) hypertension: Secondary | ICD-10-CM

## 2021-07-28 ENCOUNTER — Ambulatory Visit (INDEPENDENT_AMBULATORY_CARE_PROVIDER_SITE_OTHER): Payer: Medicare Other | Admitting: Family

## 2021-07-28 ENCOUNTER — Encounter: Payer: Self-pay | Admitting: Family

## 2021-07-28 ENCOUNTER — Other Ambulatory Visit: Payer: Self-pay | Admitting: Family

## 2021-07-28 VITALS — BP 137/76 | HR 80 | Temp 97.5°F | Ht 64.0 in | Wt 165.0 lb

## 2021-07-28 DIAGNOSIS — Z8673 Personal history of transient ischemic attack (TIA), and cerebral infarction without residual deficits: Secondary | ICD-10-CM

## 2021-07-28 DIAGNOSIS — I10 Essential (primary) hypertension: Secondary | ICD-10-CM | POA: Diagnosis not present

## 2021-07-28 DIAGNOSIS — Z0001 Encounter for general adult medical examination with abnormal findings: Secondary | ICD-10-CM

## 2021-07-28 DIAGNOSIS — E785 Hyperlipidemia, unspecified: Secondary | ICD-10-CM | POA: Diagnosis not present

## 2021-07-28 DIAGNOSIS — E559 Vitamin D deficiency, unspecified: Secondary | ICD-10-CM

## 2021-07-28 DIAGNOSIS — Z0000 Encounter for general adult medical examination without abnormal findings: Secondary | ICD-10-CM

## 2021-07-28 DIAGNOSIS — S29011A Strain of muscle and tendon of front wall of thorax, initial encounter: Secondary | ICD-10-CM

## 2021-07-28 MED ORDER — ATORVASTATIN CALCIUM 20 MG PO TABS: ORAL_TABLET | ORAL | 3 refills | Status: DC

## 2021-07-28 MED ORDER — METOPROLOL TARTRATE 25 MG PO TABS: 25.0000 mg | ORAL_TABLET | Freq: Two times a day (BID) | ORAL | 3 refills | Status: DC

## 2021-07-28 MED ORDER — AMLODIPINE BESYLATE 5 MG PO TABS
5.0000 mg | ORAL_TABLET | Freq: Every day | ORAL | 3 refills | Status: DC
Start: 2021-07-28 — End: 2022-08-19

## 2021-07-28 NOTE — Progress Notes (Signed)
 Subjective:    Patient ID: Krista Finley, female    DOB: 10/30/1950, 70 y.o.   MRN: [id number]  Chief Complaint  Patient presents with   Medical Management of Chronic Issues   PT presents to the office today for CPE. . She has hx of CVA. Does not see a neurologists.  Hypertension This is a chronic problem. The current episode started more than 1 year ago. The problem has been resolved since onset. The problem is controlled. Pertinent negatives include no malaise/fatigue, peripheral edema or shortness of breath. Risk factors for coronary artery disease include dyslipidemia, obesity and sedentary lifestyle. The current treatment provides moderate improvement.  Hyperlipidemia This is a chronic problem. The current episode started more than 1 year ago. Exacerbating diseases include obesity. Pertinent negatives include no shortness of breath. Current antihyperlipidemic treatment includes statins. The current treatment provides moderate improvement of lipids. Risk factors for coronary artery disease include dyslipidemia, hypertension and a sedentary lifestyle.     Review of Systems  Constitutional:  Negative for malaise/fatigue.  Respiratory:  Negative for shortness of breath.   All other systems reviewed and are negative.  Family History  Problem Relation Age of Onset   Hypertension Mother    Heart disease Mother    Heart disease Father        MI   Stomach cancer Maternal Aunt    Stomach cancer Maternal Grandmother    Colon cancer Neg Hx    Esophageal cancer Neg Hx    Rectal cancer Neg Hx    Social History   Socioeconomic History   Marital status: Single    Spouse name: Not on file   Number of children: Not on file   Years of education: Not on file   Highest education level: Not on file  Occupational History    Employer: [employer]  Tobacco Use   Smoking status: Never   Smokeless tobacco: Never  Vaping Use   Vaping Use: Never used  Substance and Sexual Activity    Alcohol use: Yes    Comment: 1 drink couple times of year   Drug use: No   Sexual activity: Never  Other Topics Concern   Not on file  Social History Narrative   Stays with her mother and disabled brother   Social Determinants of Health   Financial Resource Strain: Low Risk    Difficulty of Paying Living Expenses: Not hard at all  Food Insecurity: No Food Insecurity   Worried About Running Out of Food in the Last Year: Never true   Ran Out of Food in the Last Year: Never true  Transportation Needs: No Transportation Needs   Lack of Transportation (Medical): No   Lack of Transportation (Non-Medical): No  Physical Activity: Insufficiently Active   Days of Exercise per Week: 7 days   Minutes of Exercise per Session: 20 min  Stress: No Stress Concern Present   Feeling of Stress : Not at all  Social Connections: Unknown   Frequency of Communication with Friends and Family: More than three times a week   Frequency of Social Gatherings with Friends and Family: Twice a week   Attends Religious Services: More than 4 times per year   Active Member of Clubs or Organizations: Yes   Attends Club or Organization Meetings: More than 4 times per year   Marital Status: Not on file       Objective:   Physical Exam Vitals reviewed.  Constitutional:        General: She is not in acute distress.    Appearance: She is well-developed. She is obese.  HENT:     Head: Normocephalic and atraumatic.     Right Ear: Tympanic membrane normal.     Left Ear: Tympanic membrane normal.  Eyes:     Pupils: Pupils are equal, round, and reactive to light.  Neck:     Thyroid: No thyromegaly.  Cardiovascular:     Rate and Rhythm: Normal rate and regular rhythm.     Heart sounds: Normal heart sounds. No murmur heard. Pulmonary:     Effort: Pulmonary effort is normal. No respiratory distress.     Breath sounds: Normal breath sounds. No wheezing.  Abdominal:     General: Bowel sounds are normal. There is no  distension.     Palpations: Abdomen is soft.     Tenderness: There is no abdominal tenderness.  Musculoskeletal:        General: No tenderness. Normal range of motion.     Cervical back: Normal range of motion and neck supple.  Skin:    General: Skin is warm and dry.  Neurological:     Mental Status: She is alert and oriented to person, place, and time.     Cranial Nerves: No cranial nerve deficit.     Deep Tendon Reflexes: Reflexes are normal and symmetric.  Psychiatric:        Behavior: Behavior normal.        Thought Content: Thought content normal.        Judgment: Judgment normal.    BP 137/76   Pulse 80   Temp (!) 97.5 F (36.4 C) (Temporal)   Ht 5' 4" (1.626 m)   Wt 165 lb (74.8 kg)   BMI 28.32 kg/m        Assessment & Plan:  Krista Finley comes in today with chief complaint of Medical Management of Chronic Issues   Diagnosis and orders addressed:  1. Essential hypertension - metoprolol tartrate (LOPRESSOR) 25 MG tablet; Take 1 tablet (25 mg total) by mouth 2 (two) times daily. (NEEDS TO BE SEEN BEFORE NEXT REFILL)  Dispense: 180 tablet; Refill: 3 - amLODipine (NORVASC) 5 MG tablet; Take 1 tablet (5 mg total) by mouth daily.  Dispense: 90 tablet; Refill: 3 - CMP14+EGFR - CBC with Differential/Platelet  2. Hyperlipidemia, unspecified hyperlipidemia type - atorvastatin (LIPITOR) 20 MG tablet; TAKE 1 TABLET BY MOUTH ONCE DAILY 6 IN THE EVENING . APPOINTMENT REQUIRED FOR FUTURE REFILLS  Dispense: 90 tablet; Refill: 3 - CMP14+EGFR - CBC with Differential/Platelet - Lipid panel  3. History of cerebral infarction - CMP14+EGFR - CBC with Differential/Platelet  4. Vitamin D deficiency - CMP14+EGFR - CBC with Differential/Platelet - VITAMIN D 25 Hydroxy (Vit-D Deficiency, Fractures)  5. Annual physical exam - CMP14+EGFR - CBC with Differential/Platelet - Lipid panel - TSH - VITAMIN D 25 Hydroxy (Vit-D Deficiency, Fractures)   Labs pending Health  Maintenance reviewed Diet and exercise encouraged  Follow up plan: 6 months    Evelina Dun, FNP

## 2021-07-28 NOTE — Patient Instructions (Signed)
Health Maintenance After Age 68 After age 68, you are at a higher risk for certain long-term diseases and infections as well as injuries from falls. Falls are a major cause of broken bones and head injuries in people who are older than age 68. Getting regular preventive care can help to keep you healthy and well. Preventive care includes getting regular testing and making lifestyle changes as recommended by your health care provider. Talk with your health care provider about: Which screenings and tests you should have. A screening is a test that checks for a disease when you have no symptoms. A diet and exercise plan that is right for you. What should I know about screenings and tests to prevent falls? Screening and testing are the best ways to find a health problem early. Early diagnosis and treatment give you the best chance of managing medical conditions that are common after age 68. Certain conditions and lifestyle choices may make you more likely to have a fall. Your health care provider may recommend: Regular vision checks. Poor vision and conditions such as cataracts can make you more likely to have a fall. If you wear glasses, make sure to get your prescription updated if your vision changes. Medicine review. Work with your health care provider to regularly review all of the medicines you are taking, including over-the-counter medicines. Ask your health care provider about any side effects that may make you more likely to have a fall. Tell your health care provider if any medicines that you take make you feel dizzy or sleepy. Strength and balance checks. Your health care provider may recommend certain tests to check your strength and balance while standing, walking, or changing positions. Foot health exam. Foot pain and numbness, as well as not wearing proper footwear, can make you more likely to have a fall. Screenings, including: Osteoporosis screening. Osteoporosis is a condition that causes  the bones to get weaker and break more easily. Blood pressure screening. Blood pressure changes and medicines to control blood pressure can make you feel dizzy. Depression screening. You may be more likely to have a fall if you have a fear of falling, feel depressed, or feel unable to do activities that you used to do. Alcohol use screening. Using too much alcohol can affect your balance and may make you more likely to have a fall. Follow these instructions at home: Lifestyle Do not drink alcohol if: Your health care provider tells you not to drink. If you drink alcohol: Limit how much you have to: 0-1 drink a day for women. 0-2 drinks a day for men. Know how much alcohol is in your drink. In the [place] one drink equals one 12 oz bottle of beer (355 mL), one 5 oz glass of wine (148 mL), or one 1 oz glass of hard liquor (44 mL). Do not use any products that contain nicotine or tobacco. These products include cigarettes, chewing tobacco, and vaping devices, such as e-cigarettes. If you need help quitting, ask your health care provider. Activity  Follow a regular exercise program to stay fit. This will help you maintain your balance. Ask your health care provider what types of exercise are appropriate for you. If you need a cane or walker, use it as recommended by your health care provider. Wear supportive shoes that have nonskid soles. Safety  Remove any tripping hazards, such as rugs, cords, and clutter. Install safety equipment such as grab bars in bathrooms and safety rails on stairs. Keep rooms and walkways   well-lit. General instructions Talk with your health care provider about your risks for falling. Tell your health care provider if: You fall. Be sure to tell your health care provider about all falls, even ones that seem minor. You feel dizzy, tiredness (fatigue), or off-balance. Take over-the-counter and prescription medicines only as told by your health care provider. These include  supplements. Eat a healthy diet and maintain a healthy weight. A healthy diet includes low-fat dairy products, low-fat (lean) meats, and fiber from whole grains, beans, and lots of fruits and vegetables. Stay current with your vaccines. Schedule regular health, dental, and eye exams. Summary Having a healthy lifestyle and getting preventive care can help to protect your health and wellness after age 68. Screening and testing are the best way to find a health problem early and help you avoid having a fall. Early diagnosis and treatment give you the best chance for managing medical conditions that are more common for people who are older than age 68. Falls are a major cause of broken bones and head injuries in people who are older than age 68. Take precautions to prevent a fall at home. Work with your health care provider to learn what changes you can make to improve your health and wellness and to prevent falls. This information is not intended to replace advice given to you by your health care provider. Make sure you discuss any questions you have with your health care provider. Document Revised: 12/30/2020 Document Reviewed: 12/30/2020 Elsevier Patient Education  2022 [employer] Inc.  

## 2021-07-29 ENCOUNTER — Other Ambulatory Visit: Payer: Self-pay | Admitting: Family

## 2021-07-29 DIAGNOSIS — S29011A Strain of muscle and tendon of front wall of thorax, initial encounter: Secondary | ICD-10-CM

## 2021-07-29 LAB — CBC WITH DIFFERENTIAL/PLATELET
Basophils Absolute: 0 10*3/uL (ref 0.0–0.2)
Basos: 1 %
EOS (ABSOLUTE): 0.1 10*3/uL (ref 0.0–0.4)
Eos: 1 %
Hematocrit: 37.4 % (ref 34.0–46.6)
Hemoglobin: 12.5 g/dL (ref 11.1–15.9)
Immature Grans (Abs): 0 10*3/uL (ref 0.0–0.1)
Immature Granulocytes: 0 %
Lymphocytes Absolute: 1.7 10*3/uL (ref 0.7–3.1)
Lymphs: 38 %
MCH: 30.1 pg (ref 26.6–33.0)
MCHC: 33.4 g/dL (ref 31.5–35.7)
MCV: 90 fL (ref 79–97)
Monocytes Absolute: 0.4 10*3/uL (ref 0.1–0.9)
Monocytes: 9 %
Neutrophils Absolute: 2.3 10*3/uL (ref 1.4–7.0)
Neutrophils: 51 %
Platelets: 271 10*3/uL (ref 150–450)
RBC: 4.15 x10E6/uL (ref 3.77–5.28)
RDW: 12.3 % (ref 11.7–15.4)
WBC: 4.5 10*3/uL (ref 3.4–10.8)

## 2021-07-29 LAB — CMP14+EGFR
ALT: 11 IU/L (ref 0–32)
AST: 19 IU/L (ref 0–40)
Albumin/Globulin Ratio: 1.8 (ref 1.2–2.2)
Albumin: 4.2 g/dL (ref 3.8–4.8)
Alkaline Phosphatase: 93 IU/L (ref 44–121)
BUN/Creatinine Ratio: 12 (ref 12–28)
BUN: 12 mg/dL (ref 8–27)
Bilirubin Total: 0.4 mg/dL (ref 0.0–1.2)
CO2: 26 mmol/L (ref 20–29)
Calcium: 9.5 mg/dL (ref 8.7–10.3)
Chloride: 104 mmol/L (ref 96–106)
Creatinine, Ser: 1.04 mg/dL — ABNORMAL HIGH (ref 0.57–1.00)
Globulin, Total: 2.3 g/dL (ref 1.5–4.5)
Glucose: 100 mg/dL — ABNORMAL HIGH (ref 70–99)
Potassium: 4.1 mmol/L (ref 3.5–5.2)
Sodium: 142 mmol/L (ref 134–144)
Total Protein: 6.5 g/dL (ref 6.0–8.5)
eGFR: 59 mL/min/{1.73_m2} — ABNORMAL LOW (ref >59)

## 2021-07-29 LAB — LIPID PANEL
Chol/HDL Ratio: 3.2 ratio (ref 0.0–4.4)
Cholesterol, Total: 248 mg/dL — ABNORMAL HIGH (ref 100–199)
HDL: 77 mg/dL (ref >39)
LDL Chol Calc (NIH): 160 mg/dL — ABNORMAL HIGH (ref 0–99)
Triglycerides: 68 mg/dL (ref 0–149)
VLDL Cholesterol Cal: 11 mg/dL (ref 5–40)

## 2021-07-29 LAB — VITAMIN D 25 HYDROXY (VIT D DEFICIENCY, FRACTURES): Vit D, 25-Hydroxy: 26.7 ng/mL — ABNORMAL LOW (ref 30.0–100.0)

## 2021-07-29 LAB — TSH: TSH: 0.815 u[IU]/mL (ref 0.450–4.500)

## 2021-07-29 MED ORDER — VITAMIN D (ERGOCALCIFEROL) 1.25 MG (50000 UNIT) PO CAPS: 50000.0000 [IU] | ORAL_CAPSULE | ORAL | 3 refills | Status: DC

## 2021-08-20 ENCOUNTER — Telehealth: Payer: Self-pay | Admitting: Family

## 2021-09-17 ENCOUNTER — Other Ambulatory Visit: Payer: Self-pay | Admitting: Family

## 2021-10-07 ENCOUNTER — Ambulatory Visit (INDEPENDENT_AMBULATORY_CARE_PROVIDER_SITE_OTHER): Payer: Medicare Other | Admitting: Nurse Practitioner

## 2021-10-07 ENCOUNTER — Encounter: Payer: Self-pay | Admitting: Nurse Practitioner

## 2021-10-07 VITALS — BP 135/79 | HR 77 | Temp 98.1°F | Ht 64.0 in | Wt 165.0 lb

## 2021-10-07 DIAGNOSIS — R21 Rash and other nonspecific skin eruption: Secondary | ICD-10-CM | POA: Diagnosis not present

## 2021-10-07 MED ORDER — PREDNISONE 10 MG (21) PO TBPK: ORAL_TABLET | ORAL | 0 refills | Status: DC

## 2021-10-07 MED ORDER — HYDROCORTISONE 1 % EX LOTN: 1.0000 "application " | TOPICAL_LOTION | Freq: Two times a day (BID) | CUTANEOUS | 0 refills | Status: DC

## 2021-10-07 NOTE — Progress Notes (Signed)
Acute Office Visit  Subjective:    Patient ID: Krista Finley, female    DOB: Oct 10, 1950, 71 y.o.   MRN: [id number]  Chief Complaint  Patient presents with   Rash    Mid-back    Rash  Patient is in today for Rash: Patient complains of rash involving the back. Rash started 1 week ago. Appearance of rash at onset: Color of lesion(s): black. Rash has not changed over time Initial distribution: back.  Discomfort associated with rash: is pruritic.  Associated symptoms: none. Denies: none. Patient has not had previous evaluation of rash. Patient has not had previous treatment.  Response to treatment: n/a. Patient has not had contacts with similar rash. Patient has not had new exposures (soaps, lotions, laundry detergents, foods, medications, plants, insects or animals.)     Past Medical History:  Diagnosis Date   CVA (cerebral vascular accident) ([place])    Gastritis    Headache    Hyperlipidemia    Hypertension     Past Surgical History:  Procedure Laterality Date   ELBOW SURGERY Right     Family History  Problem Relation Age of Onset   Hypertension Mother    Heart disease Mother    Heart disease Father        MI   Stomach cancer Maternal Aunt    Stomach cancer Maternal Grandmother    Colon cancer Neg Hx    Esophageal cancer Neg Hx    Rectal cancer Neg Hx     Social History   Socioeconomic History   Marital status: Single    Spouse name: Not on file   Number of children: Not on file   Years of education: Not on file   Highest education level: Not on file  Occupational History    Employer: [employer]  Tobacco Use   Smoking status: Never   Smokeless tobacco: Never  Vaping Use   Vaping Use: Never used  Substance and Sexual Activity   Alcohol use: Yes    Comment: 1 drink couple times of year   Drug use: No   Sexual activity: Never  Other Topics Concern   Not on file  Social History Narrative   Stays with her mother and disabled brother   Social  Determinants of Health   Financial Resource Strain: Low Risk    Difficulty of Paying Living Expenses: Not hard at all  Food Insecurity: No Food Insecurity   Worried About Charity fundraiser in the Last Year: Never true   Ran Out of Food in the Last Year: Never true  Transportation Needs: No Transportation Needs   Lack of Transportation (Medical): No   Lack of Transportation (Non-Medical): No  Physical Activity: Insufficiently Active   Days of Exercise per Week: 7 days   Minutes of Exercise per Session: 20 min  Stress: No Stress Concern Present   Feeling of Stress : Not at all  Social Connections: Unknown   Frequency of Communication with Friends and Family: More than three times a week   Frequency of Social Gatherings with Friends and Family: Twice a week   Attends Religious Services: More than 4 times per year   Active Member of [employer] or Organizations: Yes   Attends Music therapist: More than 4 times per year   Marital Status: Not on file  Intimate Partner Violence: Not At Risk   Fear of Current or Ex-Partner: No   Emotionally Abused: No   Physically Abused: No  Sexually Abused: No    Outpatient Medications Prior to Visit  Medication Sig Dispense Refill   amLODipine (NORVASC) 5 MG tablet Take 1 tablet (5 mg total) by mouth daily. 90 tablet 3   atorvastatin (LIPITOR) 20 MG tablet TAKE 1 TABLET BY MOUTH ONCE DAILY 6 IN THE EVENING . APPOINTMENT REQUIRED FOR FUTURE REFILLS 90 tablet 3   EQ ASPIRIN ADULT LOW DOSE 81 MG EC tablet Take 1 tablet by mouth once daily 90 tablet 1   meloxicam (MOBIC) 7.5 MG tablet TAKE 1 TABLET BY MOUTH EVERY 12 HOURS WITH FOOD AS NEEDED FOR PAIN 60 tablet 0   Meloxicam 5 MG CAPS TAKE 1 CAPSULE BY MOUTH ONCE DAILY FOR 7 DAYS 7 capsule 0   metoprolol tartrate (LOPRESSOR) 25 MG tablet Take 1 tablet (25 mg total) by mouth 2 (two) times daily. (NEEDS TO BE SEEN BEFORE NEXT REFILL) 180 tablet 3   mupirocin ointment (BACTROBAN) 2 % Apply  topically 2 (two) times daily.     traMADol-acetaminophen (ULTRACET) 37.5-325 MG tablet Take 1 tablet by mouth 2 (two) times daily as needed. For abdominal pain 30 tablet 0   Vitamin D, Ergocalciferol, (DRISDOL) 1.25 MG (50000 UNIT) CAPS capsule Take 1 capsule (50,000 Units total) by mouth every 7 (seven) days. 12 capsule 3   No facility-administered medications prior to visit.    No Known Allergies  Review of Systems  Constitutional: Negative.   HENT: Negative.    Eyes: Negative.   Respiratory: Negative.    Cardiovascular: Negative.   Genitourinary: Negative.   Musculoskeletal: Negative.   Skin:  Positive for rash.  All other systems reviewed and are negative.     Objective:    Physical Exam Vitals and nursing note reviewed.  Constitutional:      Appearance: Normal appearance. She is normal weight.  HENT:     Right Ear: External ear normal.     Left Ear: External ear normal.     Mouth/Throat:     Pharynx: Oropharynx is clear.  Eyes:     Conjunctiva/sclera: Conjunctivae normal.  Cardiovascular:     Rate and Rhythm: Normal rate and regular rhythm.     Pulses: Normal pulses.     Heart sounds: Normal heart sounds.  Pulmonary:     Effort: Pulmonary effort is normal.     Breath sounds: Normal breath sounds.  Abdominal:     General: Bowel sounds are normal.  Musculoskeletal:        General: Normal range of motion.  Skin:    General: Skin is warm.     Findings: Rash present.  Neurological:     General: No focal deficit present.     Mental Status: She is alert and oriented to person, place, and time.  Psychiatric:        Mood and Affect: Mood normal.        Behavior: Behavior normal.    BP 135/79    Pulse 77    Temp 98.1 F (36.7 C)    Ht _0  (1.626 m)    Wt 165 lb (74.8 kg)    SpO2 99%    BMI 28.32 kg/m  Wt Readings from Last 3 Encounters:  10/07/21 165 lb (74.8 kg)  07/28/21 165 lb (74.8 kg)  05/30/21 161 lb 2 oz (73.1 kg)    There are no preventive care  reminders to display for this patient.  There are no preventive care reminders to display for this patient.   Lab  Results  Component Value Date   TSH 0.815 07/28/2021   Lab Results  Component Value Date   WBC 4.5 07/28/2021   HGB 12.5 07/28/2021   HCT 37.4 07/28/2021   MCV 90 07/28/2021   PLT 271 07/28/2021   Lab Results  Component Value Date   NA 142 07/28/2021   K 4.1 07/28/2021   CO2 26 07/28/2021   GLUCOSE 100 (H) 07/28/2021   BUN 12 07/28/2021   CREATININE 1.04 (H) 07/28/2021   BILITOT 0.4 07/28/2021   ALKPHOS 93 07/28/2021   AST 19 07/28/2021   ALT 11 07/28/2021   PROT 6.5 07/28/2021   ALBUMIN 4.2 07/28/2021   CALCIUM 9.5 07/28/2021   ANIONGAP 8 05/06/2019   EGFR 59 (L) 07/28/2021   GFR 99.84 06/20/2013   Lab Results  Component Value Date   CHOL 248 (H) 07/28/2021   Lab Results  Component Value Date   HDL 77 07/28/2021   Lab Results  Component Value Date   LDLCALC 160 (H) 07/28/2021   Lab Results  Component Value Date   TRIG 68 07/28/2021   Lab Results  Component Value Date   CHOLHDL 3.2 07/28/2021   Lab Results  Component Value Date   HGBA1C 5.5 04/26/2015       Assessment & Plan:  Uncontrolled rash for the past 2 weeks.  Patient has a history of eczema.  Completed assessment.  Prednisone taper, 1% hydrocortisone topical cream, avoid scratching with nails.  Keep skin moisturized.  Follow-up with worsening or unresolved symptoms.  Patient verbalized understanding.  Printed handouts given.  Rx sent to pharmacy. Problem List Items Addressed This Visit       Musculoskeletal and Integument   Rash - Primary   Relevant Medications   predniSONE (STERAPRED UNI-PAK 21 TAB) 10 MG (21) TBPK tablet   hydrocortisone 1 % lotion     Meds ordered this encounter  Medications   predniSONE (STERAPRED UNI-PAK 21 TAB) 10 MG (21) TBPK tablet    Sig: 6 tablet day  1, 5 tablet day day 2, 4 tablet day day 3, 3 tablet day 4, 2 tablet day 5, 1 tablet day 6     Dispense:  1 each    Refill:  0    Order Specific Question:   Supervising Provider    Answer:   Jeneen Rinks   hydrocortisone 1 % lotion    Sig: Apply 1 application topically 2 (two) times daily.    Dispense:  118 mL    Refill:  0    Order Specific Question:   Supervising Provider    Answer:   Claretta Fraise [[id number]]     Ivy Lynn, NP

## 2021-10-07 NOTE — Patient Instructions (Signed)
Rash, Adult  A rash is a change in the color of your skin. A rash can also change the way your skin feels. There are many different conditions and factors that can causea rash. Follow these instructions at home: The goal of treatment is to stop the itching and keep the rash from spreading. Watch for any changes in your symptoms. Let your doctor know about them. Followthese instructions to help with your condition: Medicine Take or apply over-the-counter and prescription medicines only as told by your doctor. These may include medicines: To treat red or swollen skin (corticosteroid creams). To treat itching. To treat an allergy (oral antihistamines). To treat very bad symptoms (oral corticosteroids).  Skin care Put cool cloths (compresses) on the affected areas. Do not scratch or rub your skin. Avoid covering the rash. Make sure that the rash is exposed to air as much as possible. Managing itching and discomfort Avoid hot showers or baths. These can make itching worse. A cold shower may help. Try taking a bath with: Epsom salts. You can get these at your local pharmacy or grocery store. Follow the instructions on the package. Baking soda. Pour a small amount into the bath as told by your doctor. Colloidal oatmeal. You can get this at your local pharmacy or grocery store. Follow the instructions on the package. Try putting baking soda paste onto your skin. Stir water into baking soda until it gets like a paste. Try putting on a lotion that relieves itchiness (calamine lotion). Keep cool and out of the sun. Sweating and being hot can make itching worse. General instructions  Rest as needed. Drink enough fluid to keep your pee (urine) pale yellow. Wear loose-fitting clothing. Avoid scented soaps, detergents, and perfumes. Use gentle soaps, detergents, perfumes, and other cosmetic products. Avoid anything that causes your rash. Keep a journal to help track what causes your rash. Write  down: What you eat. What cosmetic products you use. What you drink. What you wear. This includes jewelry. Keep all follow-up visits as told by your doctor. This is important.  Contact a doctor if: You sweat at night. You lose weight. You pee (urinate) more than normal. You pee less than normal, or you notice that your pee is a darker color than normal. You feel weak. You throw up (vomit). Your skin or the whites of your eyes look yellow (jaundice). Your skin: Tingles. Is numb. Your rash: Does not go away after a few days. Gets worse. You are: More thirsty than normal. More tired than normal. You have: New symptoms. Pain in your belly (abdomen). A fever. Watery poop (diarrhea). Get help right away if: You have a fever and your symptoms suddenly get worse. You start to feel mixed up (confused). You have a very bad headache or a stiff neck. You have very bad joint pains or stiffness. You have jerky movements that you cannot control (seizure). Your rash covers all or most of your body. The rash may or may not be painful. You have blisters that: Are on top of the rash. Grow larger. Grow together. Are painful. Are inside your nose or mouth. You have a rash that: Looks like purple pinprick-sized spots all over your body. Has a "bull's eye" or looks like a target. Is red and painful, causes your skin to peel, and is not from being in the sun too long. Summary A rash is a change in the color of your skin. A rash can also change the way your skin feels.   The goal of treatment is to stop the itching and keep the rash from spreading. Take or apply over-the-counter and prescription medicines only as told by your doctor. Contact a doctor if you have new symptoms or symptoms that get worse. Keep all follow-up visits as told by your doctor. This is important. This information is not intended to replace advice given to you by your health care provider. Make sure you discuss any  questions you have with your healthcare provider. Document Revised: 12/02/2018 Document Reviewed: 03/14/2018 Elsevier Patient Education  2022 Elsevier Inc.  

## 2021-10-10 ENCOUNTER — Other Ambulatory Visit: Payer: Self-pay | Admitting: Family

## 2021-10-10 DIAGNOSIS — S29011A Strain of muscle and tendon of front wall of thorax, initial encounter: Secondary | ICD-10-CM

## 2021-10-13 ENCOUNTER — Emergency Department (HOSPITAL_COMMUNITY)
Admission: EM | Admit: 2021-10-13 | Discharge: 2021-10-13 | Disposition: A | Discharge status: Home / Self Care | Payer: Medicare Other | Attending: Emergency Medicine | Admitting: Emergency Medicine

## 2021-10-13 ENCOUNTER — Emergency Department (HOSPITAL_COMMUNITY): Payer: Medicare Other

## 2021-10-13 ENCOUNTER — Encounter (HOSPITAL_COMMUNITY): Payer: Self-pay | Admitting: *Deleted

## 2021-10-13 DIAGNOSIS — Z7982 Long term (current) use of aspirin: Secondary | ICD-10-CM | POA: Insufficient documentation

## 2021-10-13 DIAGNOSIS — Z79899 Other long term (current) drug therapy: Secondary | ICD-10-CM | POA: Diagnosis not present

## 2021-10-13 DIAGNOSIS — I10 Essential (primary) hypertension: Secondary | ICD-10-CM | POA: Insufficient documentation

## 2021-10-13 DIAGNOSIS — R410 Disorientation, unspecified: Secondary | ICD-10-CM | POA: Insufficient documentation

## 2021-10-13 DIAGNOSIS — R519 Headache, unspecified: Secondary | ICD-10-CM | POA: Insufficient documentation

## 2021-10-13 LAB — CBC WITH DIFFERENTIAL/PLATELET
Abs Immature Granulocytes: 0.03 10*3/uL (ref 0.00–0.07)
Basophils Absolute: 0 10*3/uL (ref 0.0–0.1)
Basophils Relative: 0 %
Eosinophils Absolute: 0.1 10*3/uL (ref 0.0–0.5)
Eosinophils Relative: 1 %
HCT: 39.4 % (ref 36.0–46.0)
Hemoglobin: 13.2 g/dL (ref 12.0–15.0)
Immature Granulocytes: 0 %
Lymphocytes Relative: 28 %
Lymphs Abs: 2.3 10*3/uL (ref 0.7–4.0)
MCH: 30.3 pg (ref 26.0–34.0)
MCHC: 33.5 g/dL (ref 30.0–36.0)
MCV: 90.4 fL (ref 80.0–100.0)
Monocytes Absolute: 0.6 10*3/uL (ref 0.1–1.0)
Monocytes Relative: 7 %
Neutro Abs: 5.1 10*3/uL (ref 1.7–7.7)
Neutrophils Relative %: 64 %
Platelets: 315 10*3/uL (ref 150–400)
RBC: 4.36 MIL/uL (ref 3.87–5.11)
RDW: 13.7 % (ref 11.5–15.5)
WBC: 8.1 10*3/uL (ref 4.0–10.5)
nRBC: 0 % (ref 0.0–0.2)

## 2021-10-13 LAB — COMPREHENSIVE METABOLIC PANEL
ALT: 13 U/L (ref 0–44)
AST: 16 U/L (ref 15–41)
Albumin: 3.6 g/dL (ref 3.5–5.0)
Alkaline Phosphatase: 78 U/L (ref 38–126)
Anion gap: 8 (ref 5–15)
BUN: 14 mg/dL (ref 8–23)
CO2: 29 mmol/L (ref 22–32)
Calcium: 8.7 mg/dL — ABNORMAL LOW (ref 8.9–10.3)
Chloride: 101 mmol/L (ref 98–111)
Creatinine, Ser: 0.78 mg/dL (ref 0.44–1.00)
GFR, Estimated: >60 mL/min (ref >60)
Glucose, Bld: 99 mg/dL (ref 70–99)
Potassium: 3.4 mmol/L — ABNORMAL LOW (ref 3.5–5.1)
Sodium: 138 mmol/L (ref 135–145)
Total Bilirubin: 0.8 mg/dL (ref 0.3–1.2)
Total Protein: 6.6 g/dL (ref 6.5–8.1)

## 2021-10-13 LAB — URINALYSIS, ROUTINE W REFLEX MICROSCOPIC
Bacteria, UA: NONE SEEN
Bilirubin Urine: NEGATIVE
Glucose, UA: NEGATIVE mg/dL
Ketones, ur: NEGATIVE mg/dL
Nitrite: NEGATIVE
Protein, ur: NEGATIVE mg/dL
Specific Gravity, Urine: 1.01 (ref 1.005–1.030)
pH: 7 (ref 5.0–8.0)

## 2021-10-13 MED ORDER — PROCHLORPERAZINE EDISYLATE 10 MG/2ML IJ SOLN
5.0000 mg | Freq: Once | INTRAMUSCULAR | Status: AC
  Administered 2021-10-13: 5 mg via INTRAVENOUS
  Filled 2021-10-13: qty 2

## 2021-10-13 NOTE — ED Triage Notes (Signed)
States she has had trouble with her memory for the past 5 days, states she had a a BM 5 days ago and did not realize she had soiled herself. States she has a headache that started then also

## 2021-10-13 NOTE — ED Provider Notes (Signed)
[facility] EMERGENCY DEPARTMENT Provider Note   CSN: [id number] Arrival date & time: 10/13/21  1057     History  Chief Complaint  Patient presents with   Altered Mental Status    Krista Finley is a 71 y.o. female.   Altered Mental Status Patient presents with headache and altered mental status.  Has been going for the last couple weeks.  States she tends to get headaches and this is typical for her.  States it is in the frontal head.  States the headache just has not gone away like he usually does.  Also has felt more confused.  Some difficulty remembering things like what she went to the grocery store for.  No dysuria.  No chest pain.  Had recently been on steroids for rash on the back and just finished that up.     Past Medical History:  Diagnosis Date   CVA (cerebral vascular accident) ([place])    Gastritis    Headache    Hyperlipidemia    Hypertension     Home Medications Prior to Admission medications   Medication Sig Start Date End Date Taking? Authorizing Provider  amLODipine (NORVASC) 5 MG tablet Take 1 tablet (5 mg total) by mouth daily. 07/28/21   Evelina Dun A, FNP  atorvastatin (LIPITOR) 20 MG tablet TAKE 1 TABLET BY MOUTH ONCE DAILY 6 IN THE EVENING . APPOINTMENT REQUIRED FOR FUTURE REFILLS 07/28/21   Sharion Balloon, FNP  EQ ASPIRIN ADULT LOW DOSE 81 MG EC tablet Take 1 tablet by mouth once daily 09/17/21   Evelina Dun A, FNP  hydrocortisone 1 % lotion Apply 1 application topically 2 (two) times daily. 10/07/21   Ivy Lynn, NP  meloxicam (MOBIC) 7.5 MG tablet TAKE 1 TABLET BY MOUTH EVERY 12 HOURS WITH FOOD AS NEEDED FOR PAIN 10/10/21   Evelina Dun A, FNP  Meloxicam 5 MG CAPS TAKE 1 CAPSULE BY MOUTH ONCE DAILY FOR 7 DAYS 01/24/21   Levin Erp, PA  metoprolol tartrate (LOPRESSOR) 25 MG tablet Take 1 tablet (25 mg total) by mouth 2 (two) times daily. (NEEDS TO BE SEEN BEFORE NEXT REFILL) 07/28/21   Sharion Balloon, FNP  mupirocin ointment  (BACTROBAN) 2 % Apply topically 2 (two) times daily. 07/03/21   [provider]  predniSONE (STERAPRED UNI-PAK 21 TAB) 10 MG (21) TBPK tablet 6 tablet day  1, 5 tablet day day 2, 4 tablet day day 3, 3 tablet day 4, 2 tablet day 5, 1 tablet day 6 10/07/21   Ivy Lynn, NP  traMADol-acetaminophen (ULTRACET) 37.5-325 MG tablet Take 1 tablet by mouth 2 (two) times daily as needed. For abdominal pain 11/29/20   Matilde Haymaker, MD  Vitamin D, Ergocalciferol, (DRISDOL) 1.25 MG (50000 UNIT) CAPS capsule Take 1 capsule (50,000 Units total) by mouth every 7 (seven) days. 07/29/21   Sharion Balloon, FNP      Allergies    Patient has no known allergies.    Review of Systems   Review of Systems  Physical Exam Updated Vital Signs BP (!) 150/90    Pulse 77    Temp 98.4 F (36.9 C) (Oral)    Resp (!) 22    SpO2 94%  Physical Exam Vitals and nursing note reviewed.  HENT:     Head: Atraumatic.  Cardiovascular:     Rate and Rhythm: Regular rhythm.  Pulmonary:     Breath sounds: No wheezing or rhonchi.  Abdominal:  Tenderness: There is no abdominal tenderness.  Musculoskeletal:        General: No tenderness.     Cervical back: Neck supple.  Skin:    General: Skin is warm.  Neurological:     Mental Status: She is alert and oriented to person, place, and time.    ED Results / Procedures / Treatments   Labs (all labs ordered are listed, but only abnormal results are displayed) Labs Reviewed  COMPREHENSIVE METABOLIC PANEL - Abnormal; Notable for the following components:      Result Value   Potassium 3.4 (*)    Calcium 8.7 (*)    All other components within normal limits  URINALYSIS, ROUTINE W REFLEX MICROSCOPIC - Abnormal; Notable for the following components:   Color, Urine STRAW (*)    Hgb urine dipstick SMALL (*)    Leukocytes,Ua TRACE (*)    All other components within normal limits  CBC WITH DIFFERENTIAL/PLATELET    EKG None  Radiology CT Head Wo Contrast  Result  Date: 10/13/2021 CLINICAL DATA:  Worsening headache, memory issues EXAM: CT HEAD WITHOUT CONTRAST TECHNIQUE: Contiguous axial images were obtained from the base of the skull through the vertex without intravenous contrast. RADIATION DOSE REDUCTION: This exam was performed according to the departmental dose-optimization program which includes automated exposure control, adjustment of the mA and/or kV according to patient size and/or use of iterative reconstruction technique. COMPARISON:  03/16/2017 FINDINGS: Brain: Similar white matter microvascular ischemic changes throughout both cerebral hemispheres. No acute intracranial hemorrhage, mass lesion, new infarction, midline shift, herniation, hydrocephalus, or extra-axial fluid collection. No focal mass effect or edema. Cisterns are patent. No cerebellar abnormality. Vascular: No hyperdense vessel or unexpected calcification. Skull: Normal. Negative for fracture or focal lesion. Sinuses/Orbits: No acute finding. Other: None. IMPRESSION: Stable chronic white matter microvascular ischemic changes. No interval change or acute process by noncontrast CT. Electronically Signed   By: Jerilynn Mages.  Shick M.D.   On: 10/13/2021 12:17    Procedures Procedures    Medications Ordered in ED Medications  prochlorperazine (COMPAZINE) injection 5 mg (5 mg Intravenous Given 10/13/21 1315)    ED Course/ Medical Decision Making/ A&P                           Medical Decision Making Amount and/or Complexity of Data Reviewed External Data Reviewed: notes. Labs: ordered. Decision-making details documented in ED Course. Radiology: ordered and independent interpretation performed.  Risk Prescription drug management.   Patient presents with headache and confusion.  Lab work reassuring.  Head CT independently interpreted.  Has a history of headaches and states this was typical headache but lasting longer.  CT scan done because of the change.  Treated with Compazine.  Feels better.   States he feels that the mental status changes cleared up to.  Potentially could be related to pain.  I have reviewed the previous office notes.  Will discharge home.  Outpatient follow-up as needed.  Doubt severe intracranial hemorrhage.  Doubt infection as a cause of mental status change.  Potentially also could be a component of the steroid use, although that has stopped and hopefully would clear up any changes also        Final Clinical Impression(s) / ED Diagnoses Final diagnoses:  Nonintractable headache, unspecified chronicity pattern, unspecified headache type  Confusion    Rx / DC Orders ED Discharge Orders     None  Davonna Belling, MD 10/13/21 [phone]

## 2021-10-14 ENCOUNTER — Ambulatory Visit (INDEPENDENT_AMBULATORY_CARE_PROVIDER_SITE_OTHER): Payer: Medicare Other | Admitting: Nurse Practitioner

## 2021-10-14 ENCOUNTER — Encounter: Payer: Self-pay | Admitting: Nurse Practitioner

## 2021-10-14 VITALS — BP 126/77 | HR 72 | Temp 97.4°F | Ht 64.0 in | Wt 161.2 lb

## 2021-10-14 DIAGNOSIS — G4485 Primary stabbing headache: Secondary | ICD-10-CM

## 2021-10-14 MED ORDER — KETOROLAC TROMETHAMINE 60 MG/2ML IM SOLN
60.0000 mg | Freq: Once | INTRAMUSCULAR | Status: AC
  Administered 2021-10-14: 60 mg via INTRAMUSCULAR

## 2021-10-14 NOTE — Progress Notes (Signed)
° °  Subjective:    Patient ID: Krista Finley, female    DOB: 1950/10/03, 72 y.o.   MRN: [id number]   Chief Complaint: Headache   HPI Patient comes in to the office c/o headache. She went to the urgent care with altered mental status yesterday. They did CTscan which was negative. Was given compazine and discharged home. She woke up this morning with a return of headache. Rates headache 7-8/10 currently. Describes pain as a throbbing pan in temporal area. She has not taking anything at home. Nothing makes it worse and nothing really makes it better.     Review of Systems  Eyes:  Positive for photophobia and visual disturbance. Negative for pain, discharge, redness and itching.  Gastrointestinal:  Negative for nausea and vomiting.  Neurological:  Positive for headaches.      Objective:   Physical Exam Constitutional:      Appearance: She is well-developed.  Cardiovascular:     Rate and Rhythm: Normal rate and regular rhythm.     Heart sounds: Normal heart sounds.  Pulmonary:     Breath sounds: Normal breath sounds.  Skin:    General: Skin is warm.  Neurological:     General: No focal deficit present.     Mental Status: She is alert and oriented to person, place, and time.     Cranial Nerves: No cranial nerve deficit.     Sensory: No sensory deficit.     Coordination: Coordination normal.     Deep Tendon Reflexes: Reflexes normal.  Psychiatric:        Mood and Affect: Mood normal.    BP 126/77    Pulse 72    Temp (!) 97.4 F (36.3 C) (Temporal)    Ht 5\' 4"  (1.626 m)    Wt 161 lb 4 oz (73.1 kg)    BMI 27.68 kg/m        Assessment & Plan:  Krista Finley in today with chief complaint of Headache   1. Primary stabbing headache Cool compress Rest in dark room 'avoid caffeine - ketorolac (TORADOL) injection 60 mg    The above assessment and management plan was discussed with the patient. The patient verbalized understanding of and has agreed to the management  plan. Patient is aware to call the clinic if symptoms persist or worsen. Patient is aware when to return to the clinic for a follow-up visit. Patient educated on when it is appropriate to go to the emergency department.   Mary-Margaret Hassell Done, FNP

## 2021-10-14 NOTE — Patient Instructions (Signed)
General Headache Without Cause A headache is pain or discomfort you feel around the head or neck area. There are many causes and types of headaches. In some cases, the cause may not be found. Follow these instructions at home: Watch your condition for any changes. Let your doctor know about them. Take these steps to help with your condition: Managing pain   Take over-the-counter and prescription medicines only as told by your doctor. This includes medicines for pain that are taken by mouth or put on the skin. Lie down in a dark, quiet room when you have a headache. If told, put ice on your head and neck area: Put ice in a plastic bag. Place a towel between your skin and the bag. Leave the ice on for 20 minutes, 2-3 times per day. Take off the ice if your skin turns bright red. This is very important. If you cannot feel pain, heat, or cold, you have a greater risk of damage to the area. If told, put heat on the affected area. Use the heat source that your doctor recommends, such as a moist heat pack or a heating pad. Place a towel between your skin and the heat source. Leave the heat on for 20-30 minutes. Take off the heat if your skin turns bright red. This is very important. If you cannot feel pain, heat, or cold, you have a greater risk of getting burned. Keep lights dim if bright lights bother you or make your headaches worse. Eating and drinking Eat meals on a regular schedule. If you drink alcohol: Limit how much you have to: 0-1 drink a day for women who are not pregnant. 0-2 drinks a day for men. Know how much alcohol is in a drink. In the [place] one drink equals one 12 oz bottle of beer (355 mL), one 5 oz glass of wine (148 mL), or one 1 oz glass of hard liquor (44 mL). Stop drinking caffeine, or drink less caffeine. General instructions  Keep a journal to find out if certain things bring on headaches. For example, write down: What you eat and drink. How much sleep you get. Any  change to your diet or medicines. Get a massage or try other ways to relax. Limit stress. Sit up straight. Do not tighten (tense) your muscles. Do not smoke or use any products that contain nicotine or tobacco. If you need help quitting, ask your doctor. Exercise regularly as told by your doctor. Get enough sleep. This often means 7-9 hours of sleep each night. Keep all follow-up visits. This is important. Contact a doctor if: Medicine does not help your symptoms. You have a headache that feels different than the other headaches. You feel like you may vomit (nauseous) or you vomit. You have a fever. Get help right away if: Your headache: Gets very bad quickly. Gets worse after a lot of physical activity. You have any of these symptoms: You continue to vomit. A stiff neck. Trouble seeing. Your eye or ear hurts. Trouble speaking. Weak muscles or you lose muscle control. You lose your balance or have trouble walking. You feel like you will pass out (faint) or you pass out. You are mixed up (confused). You have a seizure. These symptoms may be an emergency. Get help right away. Call your local emergency services (911 in the [place] Do not wait to see if the symptoms will go away. Do not drive yourself to the hospital. Summary A headache is pain or discomfort that is felt   around the head or neck area. There are many causes and types of headaches. In some cases, the cause may not be found. Keep a journal to help find out what causes your headaches. Watch your condition for any changes. Let your doctor know about them. Contact a doctor if you have a headache that is different from usual, or if medicine does not help your headache. Get help right away if your headache gets very bad, you throw up, you have trouble seeing, you lose your balance, or you have a seizure. This information is not intended to replace advice given to you by your health care provider. Make sure you discuss any  questions you have with your health care provider. Document Revised: 01/08/2021 Document Reviewed: 01/08/2021 Elsevier Patient Education  2022 Elsevier Inc.  

## 2021-10-15 ENCOUNTER — Ambulatory Visit: Payer: Medicare Other | Admitting: Family Medicine

## 2021-10-16 ENCOUNTER — Telehealth: Payer: Self-pay | Admitting: Family

## 2021-10-16 DIAGNOSIS — Z8673 Personal history of transient ischemic attack (TIA), and cerebral infarction without residual deficits: Secondary | ICD-10-CM

## 2021-10-16 DIAGNOSIS — R519 Headache, unspecified: Secondary | ICD-10-CM

## 2021-10-16 MED ORDER — SUMATRIPTAN SUCCINATE 100 MG PO TABS: 100.0000 mg | ORAL_TABLET | ORAL | 0 refills | Status: DC | PRN

## 2021-10-16 NOTE — Telephone Encounter (Signed)
Pt called back in about her migraine, went to ED on Monday received a shot, seen [facility] on Tuesday received a shot of toradol. Today she rates her migraine 8/10 w/ no relief from cool compress or lying in a dark room Please advise

## 2021-10-16 NOTE — Addendum Note (Signed)
Addended by: Evelina Dun A on: 10/16/2021 03:16 PM   Modules accepted: Orders

## 2021-10-16 NOTE — Addendum Note (Signed)
Addended by: Evelina Dun A on: 10/16/2021 01:52 PM   Modules accepted: Orders

## 2021-10-16 NOTE — Telephone Encounter (Signed)
Imitrex Prescription sent to pharmacy, does she have any hx of migraines? If not will need referral to Neurologists. If headache worsens or does not improve needs to go to ED or if she has any changes in vision, gait, or speech.

## 2021-10-16 NOTE — Addendum Note (Signed)
Addended by: Ladean Raya on: 10/16/2021 02:44 PM   Modules accepted: Orders

## 2021-10-16 NOTE — Telephone Encounter (Signed)
Patient aware and verbalized understanding. She has not had before would like to go to [facility] aware referral has been sent

## 2021-10-16 NOTE — Telephone Encounter (Signed)
°  Prescription Request  10/16/2021  Is this a "Controlled Substance" medicine? no  Have you seen your PCP in the last 2 weeks? yes  If YES, route message to pool  -  If NO, patient needs to be scheduled for appointment.  What is the name of the medication or equipment? Pt still has headache and wants something called in  Have you contacted your pharmacy to request a refill? no   Which pharmacy would you like this sent to? [facility]   Patient notified that their request is being sent to the clinical staff for review and that they should receive a response within 2 business days.

## 2021-10-16 NOTE — Telephone Encounter (Signed)
Neurologists referral sent.  Evelina Dun, FNP

## 2021-10-17 ENCOUNTER — Ambulatory Visit: Payer: Medicare Other | Admitting: Family

## 2021-10-17 ENCOUNTER — Encounter: Payer: Self-pay | Admitting: Family

## 2021-10-27 ENCOUNTER — Other Ambulatory Visit (HOSPITAL_COMMUNITY): Payer: Self-pay | Admitting: Neurology

## 2021-10-27 ENCOUNTER — Other Ambulatory Visit: Payer: Self-pay | Admitting: Neurology

## 2021-10-27 DIAGNOSIS — G4452 New daily persistent headache (NDPH): Secondary | ICD-10-CM

## 2021-10-27 DIAGNOSIS — G8190 Hemiplegia, unspecified affecting unspecified side: Secondary | ICD-10-CM

## 2021-11-11 ENCOUNTER — Ambulatory Visit (HOSPITAL_COMMUNITY)
Admission: RE | Admit: 2021-11-11 | Discharge: 2021-11-11 | Disposition: A | Discharge status: Home / Self Care | Payer: Medicare Other | Source: Ambulatory Visit | Attending: Neurology | Admitting: Neurology

## 2021-11-11 ENCOUNTER — Other Ambulatory Visit: Payer: Self-pay

## 2021-11-11 DIAGNOSIS — G8190 Hemiplegia, unspecified affecting unspecified side: Secondary | ICD-10-CM | POA: Insufficient documentation

## 2021-11-11 DIAGNOSIS — G4452 New daily persistent headache (NDPH): Secondary | ICD-10-CM | POA: Diagnosis present

## 2021-11-28 ENCOUNTER — Ambulatory Visit: Payer: Medicare Other

## 2021-12-09 ENCOUNTER — Ambulatory Visit (INDEPENDENT_AMBULATORY_CARE_PROVIDER_SITE_OTHER): Payer: Medicare Other

## 2021-12-09 VITALS — Wt 161.0 lb

## 2021-12-09 DIAGNOSIS — Z1231 Encounter for screening mammogram for malignant neoplasm of breast: Secondary | ICD-10-CM

## 2021-12-09 DIAGNOSIS — Z0000 Encounter for general adult medical examination without abnormal findings: Secondary | ICD-10-CM

## 2021-12-09 NOTE — Patient Instructions (Signed)
Krista Finley , ?Thank you for taking time to come for your Medicare Wellness Visit. I appreciate your ongoing commitment to your health goals. Please review the following plan we discussed and let me know if I can assist you in the future.  ? ?Screening recommendations/referrals: ?Colonoscopy: Done 05/15/2019 - Repeat in 10 years with Dr Carlean Purl ?Mammogram: Done 10/16/2020 - Repeat annually *due - ordered today ?Bone Density: Done 11/05/2020 - Repeat every 2 years  ?Recommended yearly ophthalmology/optometry visit for glaucoma screening and checkup ?Recommended yearly dental visit for hygiene and checkup ? ?Vaccinations: ?Influenza vaccine: Declined- recommended every year in fall ?Pneumococcal vaccine: Declined - consider once per lifetime [id number] ?Tdap vaccine: Done 09/09/2017 - Repeat in 10 years ?Shingles vaccine: Zostavax done 2014 - Due for new vaccine - Shingrix is 2 doses 2-6 months apart and over 90% effective     ?Covid-19: Done 11/16/2019, 12/09/2019, & 08/23/2020 ? ?Advanced directives: Advance directive discussed with you today. Even though you declined this today, please call our office should you change your mind, and we can give you the proper paperwork for you to fill out.  ? ?Conditions/risks identified: Aim for 30 minutes of exercise or brisk walking, 6-8 glasses of water, and 5 servings of fruits and vegetables each day.  ? ?Next appointment: Follow up in one year for your annual wellness visit  ? ? ?Preventive Care 70 Years and Older, Female ?Preventive care refers to lifestyle choices and visits with your health care provider that can promote health and wellness. ?What does preventive care include? ?A yearly physical exam. This is also called an annual well check. ?Dental exams once or twice a year. ?Routine eye exams. Ask your health care provider how often you should have your eyes checked. ?Personal lifestyle choices, including: ?Daily care of your teeth and gums. ?Regular physical  activity. ?Eating a healthy diet. ?Avoiding tobacco and drug use. ?Limiting alcohol use. ?Practicing safe sex. ?Taking low-dose aspirin every day. ?Taking vitamin and mineral supplements as recommended by your health care provider. ?What happens during an annual well check? ?The services and screenings done by your health care provider during your annual well check will depend on your age, overall health, lifestyle risk factors, and family history of disease. ?Counseling  ?Your health care provider may ask you questions about your: ?Alcohol use. ?Tobacco use. ?Drug use. ?Emotional well-being. ?Home and relationship well-being. ?Sexual activity. ?Eating habits. ?History of falls. ?Memory and ability to understand (cognition). ?Work and work Statistician. ?Reproductive health. ?Screening  ?You may have the following tests or measurements: ?Height, weight, and BMI. ?Blood pressure. ?Lipid and cholesterol levels. These may be checked every 5 years, or more frequently if you are over 25 years old. ?Skin check. ?Lung cancer screening. You may have this screening every year starting at age 71 if you have a 30-pack-year history of smoking and currently smoke or have quit within the past 15 years. ?Fecal occult blood test (FOBT) of the stool. You may have this test every year starting at age 21. ?Flexible sigmoidoscopy or colonoscopy. You may have a sigmoidoscopy every 5 years or a colonoscopy every 10 years starting at age 15. ?Hepatitis C blood test. ?Hepatitis B blood test. ?Sexually transmitted disease (STD) testing. ?Diabetes screening. This is done by checking your blood sugar (glucose) after you have not eaten for a while (fasting). You may have this done every 1-3 years. ?Bone density scan. This is done to screen for osteoporosis. You may have this done starting at age  65. ?Mammogram. This may be done every 1-2 years. Talk to your health care provider about how often you should have regular mammograms. ?Talk with your  health care provider about your test results, treatment options, and if necessary, the need for more tests. ?Vaccines  ?Your health care provider may recommend certain vaccines, such as: ?Influenza vaccine. This is recommended every year. ?Tetanus, diphtheria, and acellular pertussis (Tdap, Td) vaccine. You may need a Td booster every 10 years. ?Zoster vaccine. You may need this after age 28. ?Pneumococcal 13-valent conjugate (PCV13) vaccine. One dose is recommended after age 72. ?Pneumococcal polysaccharide (PPSV23) vaccine. One dose is recommended after age 5. ?Talk to your health care provider about which screenings and vaccines you need and how often you need them. ?This information is not intended to replace advice given to you by your health care provider. Make sure you discuss any questions you have with your health care provider. ?Document Released: 09/06/2015 Document Revised: 04/29/2016 Document Reviewed: 06/11/2015 ?Elsevier Interactive Patient Education ? 2017 [place]. ? ?Fall Prevention in the Home ?Falls can cause injuries. They can happen to people of all ages. There are many things you can do to make your home safe and to help prevent falls. ?What can I do on the outside of my home? ?Regularly fix the edges of walkways and driveways and fix any cracks. ?Remove anything that might make you trip as you walk through a door, such as a raised step or threshold. ?Trim any bushes or trees on the path to your home. ?Use bright outdoor lighting. ?Clear any walking paths of anything that might make someone trip, such as rocks or tools. ?Regularly check to see if handrails are loose or broken. Make sure that both sides of any steps have handrails. ?Any raised decks and porches should have guardrails on the edges. ?Have any leaves, snow, or ice cleared regularly. ?Use sand or salt on walking paths during winter. ?Clean up any spills in your garage right away. This includes oil or grease spills. ?What can I  do in the bathroom? ?Use night lights. ?Install grab bars by the toilet and in the tub and shower. Do not use towel bars as grab bars. ?Use non-skid mats or decals in the tub or shower. ?If you need to sit down in the shower, use a plastic, non-slip stool. ?Keep the floor dry. Clean up any water that spills on the floor as soon as it happens. ?Remove soap buildup in the tub or shower regularly. ?Attach bath mats securely with double-sided non-slip rug tape. ?Do not have throw rugs and other things on the floor that can make you trip. ?What can I do in the bedroom? ?Use night lights. ?Make sure that you have a light by your bed that is easy to reach. ?Do not use any sheets or blankets that are too big for your bed. They should not hang down onto the floor. ?Have a firm chair that has side arms. You can use this for support while you get dressed. ?Do not have throw rugs and other things on the floor that can make you trip. ?What can I do in the kitchen? ?Clean up any spills right away. ?Avoid walking on wet floors. ?Keep items that you use a lot in easy-to-reach places. ?If you need to reach something above you, use a strong step stool that has a grab bar. ?Keep electrical cords out of the way. ?Do not use floor polish or wax that makes floors slippery.  If you must use wax, use non-skid floor wax. ?Do not have throw rugs and other things on the floor that can make you trip. ?What can I do with my stairs? ?Do not leave any items on the stairs. ?Make sure that there are handrails on both sides of the stairs and use them. Fix handrails that are broken or loose. Make sure that handrails are as long as the stairways. ?Check any carpeting to make sure that it is firmly attached to the stairs. Fix any carpet that is loose or worn. ?Avoid having throw rugs at the top or bottom of the stairs. If you do have throw rugs, attach them to the floor with carpet tape. ?Make sure that you have a light switch at the top of the stairs  and the bottom of the stairs. If you do not have them, ask someone to add them for you. ?What else can I do to help prevent falls? ?Wear shoes that: ?Do not have high heels. ?Have rubber bottoms. ?Are comfortable

## 2021-12-09 NOTE — Progress Notes (Signed)
? ?Subjective:  ? Krista Finley is a 71 y.o. female who presents for Medicare Annual (Subsequent) preventive examination. ? ?Virtual Visit via Telephone Note ? ?I connected with  [name] on 12/09/21 at  2:00 PM EDT by telephone and verified that I am speaking with the correct person using two identifiers. ? ?Location: ?Patient: Home ?Provider: [facility] ?Persons participating in the virtual visit: patient/Nurse Health Advisor ?  ?I discussed the limitations, risks, security and privacy concerns of performing an evaluation and management service by telephone and the availability of in person appointments. The patient expressed understanding and agreed to proceed. ? ?Interactive audio and video telecommunications were attempted between this nurse and patient, however failed, due to patient having technical difficulties OR patient did not have access to video capability.  We continued and completed visit with audio only. ? ?Some vital signs may be absent or patient reported.  ? ?[name], LPN  ? ?Review of Systems    ? ?Cardiac Risk Factors include: advanced age (>74mn, >>68women);sedentary lifestyle;hypertension;dyslipidemia ? ?   ?Objective:  ?  ?Today's Vitals  ? 12/09/21 1421  ?Weight: 161 lb (73 kg)  ?PainSc: 4   ? ?Body mass index is 27.64 kg/m?. ? ? ?  12/09/2021  ?  2:27 PM 10/13/2021  ? 11:21 AM 11/27/2020  ?  1:33 PM 05/06/2019  ?  9:28 AM 05/01/2019  ? 12:54 PM 12/22/2018  ? 11:30 AM 03/16/2017  ? 12:52 PM  ?Advanced Directives  ?Does Patient Have a Medical Advance Directive? No No No No No No No  ?Would patient like information on creating a medical advance directive? No - Patient declined No - Patient declined No - Patient declined No - Patient declined No - Patient declined No - Patient declined No - Patient declined  ? ? ?Current Medications (verified) ?Outpatient Encounter Medications as of 12/09/2021  ?Medication Sig  ? amLODipine (NORVASC) 5 MG tablet Take 1 tablet (5 mg total) by mouth daily.   ? atorvastatin (LIPITOR) 20 MG tablet TAKE 1 TABLET BY MOUTH ONCE DAILY 6 IN THE EVENING . APPOINTMENT REQUIRED FOR FUTURE REFILLS  ? EQ ASPIRIN ADULT LOW DOSE 81 MG EC tablet Take 1 tablet by mouth once daily  ? meloxicam (MOBIC) 7.5 MG tablet TAKE 1 TABLET BY MOUTH EVERY 12 HOURS WITH FOOD AS NEEDED FOR PAIN  ? metoprolol tartrate (LOPRESSOR) 25 MG tablet Take 1 tablet (25 mg total) by mouth 2 (two) times daily. (NEEDS TO BE SEEN BEFORE NEXT REFILL)  ? SUMAtriptan (IMITREX) 100 MG tablet Take 1 tablet (100 mg total) by mouth every 2 (two) hours as needed for migraine. May repeat in 2 hours if headache persists or recurs.  ? topiramate (TOPAMAX) 25 MG tablet Take 25 mg by mouth 2 (two) times daily.  ? traMADol-acetaminophen (ULTRACET) 37.5-325 MG tablet Take 1 tablet by mouth 2 (two) times daily as needed. For abdominal pain  ? Vitamin D, Ergocalciferol, (DRISDOL) 1.25 MG (50000 UNIT) CAPS capsule Take 1 capsule (50,000 Units total) by mouth every 7 (seven) days.  ? hydrocortisone 1 % lotion Apply 1 application topically 2 (two) times daily. (Patient not taking: Reported on 12/09/2021)  ? [DISCONTINUED] mupirocin ointment (BACTROBAN) 2 % Apply topically 2 (two) times daily. (Patient not taking: Reported on 12/09/2021)  ? ?No facility-administered encounter medications on file as of 12/09/2021.  ? ? ?Allergies (verified) ?Patient has no known allergies.  ? ?History: ?Past Medical History:  ?Diagnosis Date  ? CVA (  cerebral vascular accident) [facility])   ? Gastritis   ? Headache   ? Hyperlipidemia   ? Hypertension   ? ?Past Surgical History:  ?Procedure Laterality Date  ? ELBOW SURGERY Right   ? ?Family History  ?Problem Relation Age of Onset  ? Hypertension Mother   ? Heart disease Mother   ? Heart disease Father   ?     MI  ? Stomach cancer Maternal Aunt   ? Stomach cancer Maternal Grandmother   ? Colon cancer Neg Hx   ? Esophageal cancer Neg Hx   ? Rectal cancer Neg Hx   ? ?Social History  ? ?Socioeconomic History  ?  Marital status: Single  ?  Spouse name: Not on file  ? Number of children: Not on file  ? Years of education: Not on file  ? Highest education level: Not on file  ?Occupational History  ? Occupation: retired  ?  Employer: [employer]  ?Tobacco Use  ? Smoking status: Never  ? Smokeless tobacco: Never  ?Vaping Use  ? Vaping Use: Never used  ?Substance and Sexual Activity  ? Alcohol use: Yes  ?  Comment: 1 drink couple times of year  ? Drug use: No  ? Sexual activity: Never  ?Other Topics Concern  ? Not on file  ?Social History Narrative  ? Stays with her mother and disabled brother  ? ?Social Determinants of Health  ? ?Financial Resource Strain: Low Risk   ? Difficulty of Paying Living Expenses: Not hard at all  ?Food Insecurity: No Food Insecurity  ? Worried About Charity fundraiser in the Last Year: Never true  ? Ran Out of Food in the Last Year: Never true  ?Transportation Needs: No Transportation Needs  ? Lack of Transportation (Medical): No  ? Lack of Transportation (Non-Medical): No  ?Physical Activity: Insufficiently Active  ? Days of Exercise per Week: 7 days  ? Minutes of Exercise per Session: 20 min  ?Stress: No Stress Concern Present  ? Feeling of Stress : Not at all  ?Social Connections: Moderately Integrated  ? Frequency of Communication with Friends and Family: More than three times a week  ? Frequency of Social Gatherings with Friends and Family: Twice a week  ? Attends Religious Services: More than 4 times per year  ? Active Member of Clubs or Organizations: Yes  ? Attends Archivist Meetings: More than 4 times per year  ? Marital Status: Never married  ? ? ?Tobacco Counseling ?Counseling given: Not Answered ? ? ?Clinical Intake: ? ?Pre-visit preparation completed: Yes ? ?Pain : 0-10 ?Pain Score: 4  ?Pain Type: Chronic pain ?Pain Location: Head ?Pain Descriptors / Indicators: Aching ?Pain Onset: More than a month ago ?Pain Frequency: Intermittent ? ?  ? ?BMI - recorded: 27.64 ?Nutritional  Status: BMI 25 -29 Overweight ?Nutritional Risks: None ?Diabetes: No ? ?How often do you need to have someone help you when you read instructions, pamphlets, or other written materials from your doctor or pharmacy?: 1 - Never ? ?Diabetic? no ? ?Interpreter Needed?: No ? ?Information entered by :: Abria Vannostrand, LPN ? ? ?Activities of Daily Living ? ?  12/09/2021  ?  2:24 PM  ?In your present state of health, do you have any difficulty performing the following activities:  ?Hearing? 0  ?Vision? 0  ?Difficulty concentrating or making decisions? 0  ?Walking or climbing stairs? 0  ?Dressing or bathing? 0  ?Doing errands, shopping? 0  ?Preparing Food and  eating ? N  ?Using the Toilet? N  ?In the past six months, have you accidently leaked urine? N  ?Do you have problems with loss of bowel control? N  ?Managing your Medications? N  ?Managing your Finances? N  ?Housekeeping or managing your Housekeeping? N  ? ? ?Patient Care Team: ?Sharion Balloon, FNP as PCP - General (Nurse Practitioner) ?Phillips Odor, MD as Consulting Physician (Neurology) ?Celestia Khat, OD (Optometry) ?Gatha Mayer, MD as Consulting Physician (Gastroenterology) ? ?Indicate any recent Medical Services you may have received from other than [facility] providers in the past year (date may be approximate). ? ?   ?Assessment:  ? This is a routine wellness examination for [place]. ? ?Hearing/Vision screen ?Hearing Screening - Comments:: Denies hearing difficulties   ?Vision Screening - Comments:: Wears rx glasses - up to date with routine eye exams with [facility] ? ?Dietary issues and exercise activities discussed: ?Current Exercise Habits: Home exercise routine, Type of exercise: walking, Time (Minutes): 20, Frequency (Times/Week): 7, Weekly Exercise (Minutes/Week): 140, Intensity: Mild, Exercise limited by: None identified ? ? Goals Addressed   ? ?  ?  ?  ?  ? This Visit's Progress  ?  DIET - INCREASE WATER INTAKE   On track  ?  Recommend 6-8 glasses  per day ?  ?  Patient Stated   Not on track  ?  She would like to lose a little weight ?  ? ?  ? ?Depression Screen ? ?  12/09/2021  ?  2:24 PM 10/14/2021  ?  2:09 PM 05/30/2021  ?  2:50 PM 11/27/2020  ?  1:31

## 2021-12-24 ENCOUNTER — Ambulatory Visit
Admission: RE | Admit: 2021-12-24 | Discharge: 2021-12-24 | Disposition: A | Discharge status: Home / Self Care | Payer: Medicare Other | Source: Ambulatory Visit | Attending: Family | Admitting: Family

## 2021-12-24 DIAGNOSIS — Z1231 Encounter for screening mammogram for malignant neoplasm of breast: Secondary | ICD-10-CM

## 2022-01-05 ENCOUNTER — Other Ambulatory Visit: Payer: Self-pay | Admitting: Family

## 2022-01-05 DIAGNOSIS — S29011A Strain of muscle and tendon of front wall of thorax, initial encounter: Secondary | ICD-10-CM

## 2022-01-26 ENCOUNTER — Ambulatory Visit (INDEPENDENT_AMBULATORY_CARE_PROVIDER_SITE_OTHER): Payer: Medicare Other | Admitting: Family

## 2022-01-26 ENCOUNTER — Encounter: Payer: Self-pay | Admitting: Family

## 2022-01-26 VITALS — BP 131/76 | HR 79 | Temp 98.5°F | Ht 64.0 in | Wt 158.0 lb

## 2022-01-26 DIAGNOSIS — E785 Hyperlipidemia, unspecified: Secondary | ICD-10-CM | POA: Diagnosis not present

## 2022-01-26 DIAGNOSIS — E559 Vitamin D deficiency, unspecified: Secondary | ICD-10-CM

## 2022-01-26 DIAGNOSIS — I10 Essential (primary) hypertension: Secondary | ICD-10-CM | POA: Diagnosis not present

## 2022-01-26 DIAGNOSIS — G43109 Migraine with aura, not intractable, without status migrainosus: Secondary | ICD-10-CM | POA: Diagnosis not present

## 2022-01-26 DIAGNOSIS — G43909 Migraine, unspecified, not intractable, without status migrainosus: Secondary | ICD-10-CM | POA: Insufficient documentation

## 2022-01-26 DIAGNOSIS — R413 Other amnesia: Secondary | ICD-10-CM

## 2022-01-26 NOTE — Progress Notes (Signed)
Subjective:    Patient ID: Maurilio Lovely, female    DOB: Jan 15, 1951, 71 y.o.   MRN: [id number]  Chief Complaint  Patient presents with   Medical Management of Chronic Issues    F/u stabbing headaches   PT presents to the office today for CPE. She has hx of CVA. She has been diagnosed with migraine and saw a Neurologists. She was started on Topamax. She is followed by Neurologists.  States since then she has had memory issues.  Hypertension This is a chronic problem. The current episode started more than 1 year ago. The problem has been resolved since onset. The problem is controlled. Associated symptoms include headaches and malaise/fatigue. Pertinent negatives include no peripheral edema or shortness of breath. Risk factors for coronary artery disease include dyslipidemia and sedentary lifestyle. The current treatment provides moderate improvement. There is no history of CVA or heart failure.  Hyperlipidemia This is a chronic problem. The current episode started more than 1 year ago. Pertinent negatives include no shortness of breath. Current antihyperlipidemic treatment includes statins. The current treatment provides moderate improvement of lipids. Risk factors for coronary artery disease include dyslipidemia, hypertension and a sedentary lifestyle.  Migraine  This is a recurrent problem. The problem occurs intermittently (3 times a week). The problem has been waxing and waning. The pain is located in the Frontal region. The pain quality is similar to prior headaches. The pain is at a severity of 8/10. Associated symptoms include nausea, phonophobia and photophobia. She has tried beta blockers, darkened room and triptans for the symptoms. The treatment provided no relief. Her past medical history is significant for hypertension.     Review of Systems  Constitutional:  Positive for malaise/fatigue.  Eyes:  Positive for photophobia.  Respiratory:  Negative for shortness of breath.    Gastrointestinal:  Positive for nausea.  Neurological:  Positive for headaches.  All other systems reviewed and are negative.     Objective:   Physical Exam Vitals reviewed.  Constitutional:      General: She is not in acute distress.    Appearance: She is well-developed.  HENT:     Head: Normocephalic and atraumatic.     Right Ear: Tympanic membrane normal.     Left Ear: Tympanic membrane normal.  Eyes:     Pupils: Pupils are equal, round, and reactive to light.  Neck:     Thyroid: No thyromegaly.  Cardiovascular:     Rate and Rhythm: Normal rate and regular rhythm.     Heart sounds: Normal heart sounds. No murmur heard. Pulmonary:     Effort: Pulmonary effort is normal. No respiratory distress.     Breath sounds: Normal breath sounds. No wheezing.  Abdominal:     General: Bowel sounds are normal. There is no distension.     Palpations: Abdomen is soft.     Tenderness: There is no abdominal tenderness.  Musculoskeletal:        General: No tenderness. Normal range of motion.     Cervical back: Normal range of motion and neck supple.  Skin:    General: Skin is warm and dry.  Neurological:     Mental Status: She is alert and oriented to person, place, and time.     Cranial Nerves: No cranial nerve deficit.     Deep Tendon Reflexes: Reflexes are normal and symmetric.  Psychiatric:        Behavior: Behavior normal.        Thought Content:  Thought content normal.        Judgment: Judgment normal.      BP 131/76   Pulse 79   Temp 98.5 F (36.9 C)   Ht '5\' 4"'  (1.626 m)   Wt 158 lb (71.7 kg)   SpO2 98%   BMI 27.12 kg/m      Assessment & Plan:  ANNALEISE BURGER comes in today with chief complaint of Medical Management of Chronic Issues (F/u stabbing headaches)   Diagnosis and orders addressed:  1. Essential hypertension - CMP14+EGFR - CBC with Differential/Platelet  2. Hyperlipidemia, unspecified hyperlipidemia type - CMP14+EGFR - CBC with  Differential/Platelet  3. Migraine with aura and without status migrainosus, not intractable Stop Topamax given brain fog and memory changes since starting. She will call her Neurologists today and let them know. She has a follow up with on 02/03/22. Encourage headache journal  Avoid caffeine Encourage 8 hours of sleep   - CMP14+EGFR - CBC with Differential/Platelet  4. Vitamin D deficiency - CMP14+EGFR - CBC with Differential/Platelet  5. Memory change -Stop Topamax  - CMP14+EGFR - CBC with Differential/Platelet - TSH   Labs pending Health Maintenance reviewed Diet and exercise encouraged  Follow up plan: 3 months    Evelina Dun, FNP

## 2022-01-26 NOTE — Patient Instructions (Signed)
Migraine Headache A migraine headache is an intense, throbbing pain on one side or both sides of the head. Migraine headaches may also cause other symptoms, such as nausea, vomiting, and sensitivity to light and noise. A migraine headache can last from 4 hours to 3 days. Talk with your doctor about what things may bring on (trigger) your migraine headaches. What are the causes? The exact cause of this condition is not known. However, a migraine may be caused when nerves in the brain become irritated and release chemicals that cause inflammation of blood vessels. This inflammation causes pain. This condition may be triggered or caused by: Drinking alcohol. Smoking. Taking medicines, such as: Medicine used to treat chest pain (nitroglycerin). Birth control pills. Estrogen. Certain blood pressure medicines. Eating or drinking products that contain nitrates, glutamate, aspartame, or tyramine. Aged cheeses, chocolate, or caffeine may also be triggers. Doing physical activity. Other things that may trigger a migraine headache include: Menstruation. Pregnancy. Hunger. Stress. Lack of sleep or too much sleep. Weather changes. Fatigue. What increases the risk? The following factors may make you more likely to experience migraine headaches: Being a certain age. This condition is more common in people who are 25-55 years old. Being female. Having a family history of migraine headaches. Being Caucasian. Having a mental health condition, such as depression or anxiety. Being obese. What are the signs or symptoms? The main symptom of this condition is pulsating or throbbing pain. This pain may: Happen in any area of the head, such as on one side or both sides. Interfere with daily activities. Get worse with physical activity. Get worse with exposure to bright lights or loud noises. Other symptoms may include: Nausea. Vomiting. Dizziness. General sensitivity to bright lights, loud noises, or  smells. Before you get a migraine headache, you may get warning signs (an aura). An aura may include: Seeing flashing lights or having blind spots. Seeing bright spots, halos, or zigzag lines. Having tunnel vision or blurred vision. Having numbness or a tingling feeling. Having trouble talking. Having muscle weakness. Some people have symptoms after a migraine headache (postdromal phase), such as: Feeling tired. Difficulty concentrating. How is this diagnosed? A migraine headache can be diagnosed based on: Your symptoms. A physical exam. Tests, such as: CT scan or an MRI of the head. These imaging tests can help rule out other causes of headaches. Taking fluid from the spine (lumbar puncture) and analyzing it (cerebrospinal fluid analysis, or CSF analysis). How is this treated? This condition may be treated with medicines that: Relieve pain. Relieve nausea. Prevent migraine headaches. Treatment for this condition may also include: Acupuncture. Lifestyle changes like avoiding foods that trigger migraine headaches. Biofeedback. Cognitive behavioral therapy. Follow these instructions at home: Medicines Take over-the-counter and prescription medicines only as told by your health care provider. Ask your health care provider if the medicine prescribed to you: Requires you to avoid driving or using heavy machinery. Can cause constipation. You may need to take these actions to prevent or treat constipation: Drink enough fluid to keep your urine pale yellow. Take over-the-counter or prescription medicines. Eat foods that are high in fiber, such as beans, whole grains, and fresh fruits and vegetables. Limit foods that are high in fat and processed sugars, such as fried or sweet foods. Lifestyle Do not drink alcohol. Do not use any products that contain nicotine or tobacco, such as cigarettes, e-cigarettes, and chewing tobacco. If you need help quitting, ask your health care  provider. Get at least 8   hours of sleep every night. Find ways to manage stress, such as meditation, deep breathing, or yoga. General instructions     Keep a journal to find out what may trigger your migraine headaches. For example, write down: What you eat and drink. How much sleep you get. Any change to your diet or medicines. If you have a migraine headache: Avoid things that make your symptoms worse, such as bright lights. It may help to lie down in a dark, quiet room. Do not drive or use heavy machinery. Ask your health care provider what activities are safe for you while you are experiencing symptoms. Keep all follow-up visits as told by your health care provider. This is important. Contact a health care provider if: You develop symptoms that are different or more severe than your usual migraine headache symptoms. You have more than 15 headache days in one month. Get help right away if: Your migraine headache becomes severe. Your migraine headache lasts longer than 72 hours. You have a fever. You have a stiff neck. You have vision loss. Your muscles feel weak or like you cannot control them. You start to lose your balance often. You have trouble walking. You faint. You have a seizure. Summary A migraine headache is an intense, throbbing pain on one side or both sides of the head. Migraines may also cause other symptoms, such as nausea, vomiting, and sensitivity to light and noise. This condition may be treated with medicines and lifestyle changes. You may also need to avoid certain things that trigger a migraine headache. Keep a journal to find out what may trigger your migraine headaches. Contact your health care provider if you have more than 15 headache days in a month or you develop symptoms that are different or more severe than your usual migraine headache symptoms. This information is not intended to replace advice given to you by your health care provider. Make sure  you discuss any questions you have with your health care provider. Document Revised: 12/02/2018 Document Reviewed: 09/22/2018 Elsevier Patient Education  2023 [employer]  

## 2022-01-27 LAB — CMP14+EGFR
ALT: 9 IU/L (ref 0–32)
AST: 19 IU/L (ref 0–40)
Albumin/Globulin Ratio: 1.6 (ref 1.2–2.2)
Albumin: 4.3 g/dL (ref 3.8–4.8)
Alkaline Phosphatase: 105 IU/L (ref 44–121)
BUN/Creatinine Ratio: 11 — ABNORMAL LOW (ref 12–28)
BUN: 11 mg/dL (ref 8–27)
Bilirubin Total: 0.6 mg/dL (ref 0.0–1.2)
CO2: 22 mmol/L (ref 20–29)
Calcium: 9.5 mg/dL (ref 8.7–10.3)
Chloride: 108 mmol/L — ABNORMAL HIGH (ref 96–106)
Creatinine, Ser: 1.02 mg/dL — ABNORMAL HIGH (ref 0.57–1.00)
Globulin, Total: 2.7 g/dL (ref 1.5–4.5)
Glucose: 96 mg/dL (ref 70–99)
Potassium: 3.9 mmol/L (ref 3.5–5.2)
Sodium: 145 mmol/L — ABNORMAL HIGH (ref 134–144)
Total Protein: 7 g/dL (ref 6.0–8.5)
eGFR: 60 mL/min/{1.73_m2} (ref >59)

## 2022-01-27 LAB — CBC WITH DIFFERENTIAL/PLATELET
Basophils Absolute: 0 10*3/uL (ref 0.0–0.2)
Basos: 1 %
EOS (ABSOLUTE): 0.1 10*3/uL (ref 0.0–0.4)
Eos: 1 %
Hematocrit: 35.4 % (ref 34.0–46.6)
Hemoglobin: 12.2 g/dL (ref 11.1–15.9)
Immature Grans (Abs): 0 10*3/uL (ref 0.0–0.1)
Immature Granulocytes: 0 %
Lymphocytes Absolute: 1.4 10*3/uL (ref 0.7–3.1)
Lymphs: 30 %
MCH: 30.7 pg (ref 26.6–33.0)
MCHC: 34.5 g/dL (ref 31.5–35.7)
MCV: 89 fL (ref 79–97)
Monocytes Absolute: 0.4 10*3/uL (ref 0.1–0.9)
Monocytes: 9 %
Neutrophils Absolute: 2.8 10*3/uL (ref 1.4–7.0)
Neutrophils: 59 %
Platelets: 252 10*3/uL (ref 150–450)
RBC: 3.97 x10E6/uL (ref 3.77–5.28)
RDW: 12.8 % (ref 11.7–15.4)
WBC: 4.7 10*3/uL (ref 3.4–10.8)

## 2022-01-27 LAB — TSH: TSH: 0.662 u[IU]/mL (ref 0.450–4.500)

## 2022-01-29 ENCOUNTER — Telehealth: Payer: Self-pay | Admitting: Family

## 2022-01-29 DIAGNOSIS — R519 Headache, unspecified: Secondary | ICD-10-CM

## 2022-01-30 NOTE — Telephone Encounter (Signed)
Referral to another Neurologists placed.   Evelina Dun, FNP

## 2022-02-26 ENCOUNTER — Other Ambulatory Visit: Payer: Self-pay | Admitting: Family

## 2022-02-26 DIAGNOSIS — S29011A Strain of muscle and tendon of front wall of thorax, initial encounter: Secondary | ICD-10-CM

## 2022-03-03 ENCOUNTER — Telehealth: Payer: Self-pay | Admitting: Family

## 2022-03-03 NOTE — Telephone Encounter (Signed)
Pt has been referred to Dr. Rexene Alberts. She states that her appt is on 7/24. Advised that the neurologist would be the best to manage her memory concerns. Pt understood and has no further concerns.

## 2022-03-03 NOTE — Telephone Encounter (Signed)
Pt called stating that she saw PCP about a month ago and was advised to stop taking a pill that was prescribed to her by her Neurologist because she thought it could be what was causing her to have memory loss.  Pt says she stopped taking the pill but is still having trouble with her memory. Wants to know if there is anything PCP can prescribe to pt to help with her memory.   Please advise and call patient.

## 2022-03-09 ENCOUNTER — Ambulatory Visit (INDEPENDENT_AMBULATORY_CARE_PROVIDER_SITE_OTHER): Payer: Medicare Other | Admitting: Nurse Practitioner

## 2022-03-09 ENCOUNTER — Encounter: Payer: Self-pay | Admitting: Nurse Practitioner

## 2022-03-09 VITALS — BP 126/68 | HR 63 | Temp 97.5°F | Resp 20 | Ht 64.0 in | Wt 158.0 lb

## 2022-03-09 DIAGNOSIS — L299 Pruritus, unspecified: Secondary | ICD-10-CM | POA: Diagnosis not present

## 2022-03-09 MED ORDER — TRIAMCINOLONE ACETONIDE 0.1 % EX CREA: 1.0000 | TOPICAL_CREAM | Freq: Two times a day (BID) | CUTANEOUS | 0 refills | Status: DC

## 2022-03-09 NOTE — Progress Notes (Signed)
   Subjective:    Patient ID: Krista Finley, female    DOB: Aug 19, 1951, 71 y.o.   MRN: [id number]   Chief Complaint: Rash on back (Since last Wednesday/)   HPI She has developed a rash on her back. She noticed it starting last Wednesday. Says rash burns and itches. Has spread. She deneies any exposure to new skin products. He used hydrocortisone cream on it last night and this morning.     Review of Systems  Constitutional:  Negative for diaphoresis.  Eyes:  Negative for pain.  Respiratory:  Negative for shortness of breath.   Cardiovascular:  Negative for chest pain, palpitations and leg swelling.  Gastrointestinal:  Negative for abdominal pain.  Endocrine: Negative for polydipsia.  Skin:  Negative for rash.  Neurological:  Negative for dizziness, weakness and headaches.  Hematological:  Does not bruise/bleed easily.  All other systems reviewed and are negative.      Objective:   Physical Exam Constitutional:      Appearance: Normal appearance.  Cardiovascular:     Rate and Rhythm: Normal rate and regular rhythm.     Heart sounds: Normal heart sounds.  Pulmonary:     Breath sounds: Normal breath sounds.  Skin:    General: Skin is warm.     Findings: Rash (no rash noted on back in area where itches.) present.  Neurological:     General: No focal deficit present.     Mental Status: She is alert and oriented to person, place, and time.  Psychiatric:        Mood and Affect: Mood normal.        Behavior: Behavior normal.    BP 126/68   Pulse 63   Temp (!) 97.5 F (36.4 C) (Temporal)   Resp 20   Ht '5\' 4"'$  (1.626 m)   Wt 158 lb (71.7 kg)   SpO2 100%   BMI 27.12 kg/m         Assessment & Plan:  KEIRI SOLANO in today with chief complaint of Rash on back (Since last Wednesday/)   1. Pruritus Avoid scratching 'apply cream mixed with lotion BID Claritin daily RTO prn    The above assessment and management plan was discussed with the patient. The  patient verbalized understanding of and has agreed to the management plan. Patient is aware to call the clinic if symptoms persist or worsen. Patient is aware when to return to the clinic for a follow-up visit. Patient educated on when it is appropriate to go to the emergency department.   Mary-Margaret Hassell Done, FNP

## 2022-03-09 NOTE — Patient Instructions (Signed)
Pruritus Pruritus is an itchy feeling on the skin. One of the most common causes is dry skin, but many different things can cause itching. Most cases of itching do not require medical attention. Sometimes itchy skin can turn into a rash. Follow these instructions at home: Skin care  Apply moisturizing lotion to your skin as needed. Lotion that contains petroleum jelly is best. Take medicines or apply medicated creams only as told by your health care provider. This may include: Corticosteroid cream. Anti-itch lotions. Oral antihistamines. Apply a cool, wet cloth (cool compress) to the affected areas. Take baths with one of the following: Epsom salts. You can get these at your local pharmacy or grocery store. Follow the instructions on the packaging. Baking soda. Pour a small amount into the bath as told by your health care provider. Colloidal oatmeal. You can get this at your local pharmacy or grocery store. Follow the instructions on the packaging. Apply baking soda paste to your skin. To make the paste, stir water into a small amount of baking soda until it reaches a paste-like consistency. Do not scratch your skin. Do not take hot showers or baths, which can make itching worse. A cool shower may help with itching as long as you apply moisturizing lotion after the shower. Do not use scented soaps, detergents, perfumes, and cosmetic products. Instead, use gentle, unscented versions of these items. General instructions Avoid wearing tight clothes. Keep a journal to help find out what is causing your itching. Write down: What you eat and drink. What cosmetic products you use. What soaps or detergents you use. What you wear, including jewelry. Use a humidifier. This keeps the air moist, which helps to prevent dry skin. Be aware of any changes in your itchiness. Contact a health care provider if: The itching does not go away after several days. You are unusually thirsty or urinating more  than normal. Your skin tingles or feels numb. Your skin or the white parts of your eyes turn yellow (jaundice). You feel weak. You have any of the following: Night sweats. Tiredness (fatigue). Weight loss. Abdominal pain. Summary Pruritus is an itchy feeling on the skin. One of the most common causes is dry skin, but many different conditions and factors can cause itching. Apply moisturizing lotion to your skin as needed. Lotion that contains petroleum jelly is best. Take medicines or apply medicated creams only as told by your health care provider. Do not take hot showers or baths. Do not use scented soaps, detergents, perfumes, or cosmetic products. This information is not intended to replace advice given to you by your health care provider. Make sure you discuss any questions you have with your health care provider. Document Revised: 05/26/2021 Document Reviewed: 05/26/2021 Elsevier Patient Education  [place].

## 2022-03-16 ENCOUNTER — Ambulatory Visit: Payer: Medicare Other | Admitting: Neurology

## 2022-03-16 ENCOUNTER — Encounter: Payer: Self-pay | Admitting: Neurology

## 2022-03-16 VITALS — BP 140/83 | HR 64 | Ht 64.0 in | Wt 160.0 lb

## 2022-03-16 DIAGNOSIS — R519 Headache, unspecified: Secondary | ICD-10-CM

## 2022-03-16 DIAGNOSIS — R351 Nocturia: Secondary | ICD-10-CM

## 2022-03-16 DIAGNOSIS — Z9189 Other specified personal risk factors, not elsewhere classified: Secondary | ICD-10-CM | POA: Diagnosis not present

## 2022-03-16 DIAGNOSIS — E663 Overweight: Secondary | ICD-10-CM | POA: Diagnosis not present

## 2022-03-16 DIAGNOSIS — Z8673 Personal history of transient ischemic attack (TIA), and cerebral infarction without residual deficits: Secondary | ICD-10-CM

## 2022-03-16 NOTE — Patient Instructions (Signed)
Thank you for choosing [facility]! It was nice to meet you today!   Here is what we discussed today:   I am not convinced that you have actual migraines.  We can certainly try you on another medication in the near future but for now I would like to proceed with sleep evaluation with a test.  Based on your symptoms and your exam I believe you are at risk for obstructive sleep apnea (aka OSA). We should proceed with a sleep study to determine whether you do or do not have OSA and how severe it is. Even, if you have mild OSA, I may want you to consider treatment with CPAP, as treatment of even borderline or mild sleep apnea can result and improvement of symptoms such as sleep disruption, daytime sleepiness, nighttime bathroom breaks, restless leg symptoms, improvement of headache syndromes, even improved mood disorder.   As explained, an attended sleep study (meaning you get to stay overnight in the sleep lab), lets [place] monitor sleep-related behaviors such as sleep talking and leg movements in sleep, in addition to monitoring for sleep apnea.  A home sleep test is a screening tool for sleep apnea diagnosis only, but unfortunately, does not help with any other sleep-related diagnoses.  Please remember, the long-term risks and ramifications of untreated moderate to severe obstructive sleep apnea may include (but are not limited to): increased risk for cardiovascular disease, including congestive heart failure, stroke, difficult to control hypertension, treatment resistant obesity, arrhythmias, especially irregular heartbeat commonly known as A. Fib. (atrial fibrillation); even type 2 diabetes has been linked to untreated OSA.   Other correlations that untreated obstructive sleep apnea include macular edema which is swelling of the retina in the eyes, droopy eyelid syndrome, and elevated hemoglobin and hematocrit levels (often referred to as polycythemia).  Sleep apnea can cause disruption  of sleep and sleep deprivation in most cases, which, in turn, can cause recurrent headaches, problems with memory, mood, concentration, focus, and vigilance. Most people with untreated sleep apnea report excessive daytime sleepiness, which can affect their ability to drive. Please do not drive or use heavy equipment or machinery, if you feel sleepy! Patients with sleep apnea can also develop difficulty initiating and maintaining sleep (aka insomnia).   Having sleep apnea may increase your risk for other sleep disorders, including involuntary behaviors sleep such as sleep terrors, sleep talking, sleepwalking.    Having sleep apnea can also increase your risk for restless leg syndrome and leg movements at night.   Please note that untreated obstructive sleep apnea may carry additional perioperative morbidity. Patients with significant obstructive sleep apnea (typically, in the moderate to severe degree) should receive, if possible, perioperative PAP (positive airway pressure) therapy and the surgeons and particularly the anesthesiologists should be informed of the diagnosis and the severity of the sleep disordered breathing.   We will call you or email you through [place] with regards to your test results and plan a follow-up in sleep clinic accordingly. Most likely, you will hear from one of our nurses.   Our sleep lab administrative assistant will call you to schedule your sleep study and give you further instructions, regarding the check in process for the sleep study, arrival time, what to bring, when you can expect to leave after the study, etc., and to answer any other logistical questions you may have. If you don't hear back from her by about 2 weeks from now, please feel free to call her direct line at [phone]  or you can call our general clinic number, or email [place] through My Chart.

## 2022-03-16 NOTE — Progress Notes (Signed)
Subjective:    Patient ID: Krista Finley is a 71 y.o. female.  HPI    Krista Age, MD, PhD [facility] [address] P.O. [place], [place] [address]  Dear Krista Finley,   I saw your patient, Krista Finley, upon your kind request in my neurologic clinic today for initial consultation of her recurrent headaches, concern for migraines.  The patient is unaccompanied today.  As you know, Krista Finley is a 71 year old right-handed woman with an underlying medical history of hypertension, hyperlipidemia, vitamin D deficiency, history of stroke, and overweight state, who reports recurrent headaches for the past 4 months, she has nearly daily headaches, describes these in the frontal area primarily, throbbing in nature but not associated with nausea or vomiting or photophobia.  She often wakes up with a headache.  She is not sure if she snores and she has not had a partner in over 20 years.  She lives with her elderly mom and her brother and is a caretaker for both.  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  Of note, she does not drink much in the way of water, she likes to drink lemonade and some soda, occasional tea, she is a non-smoker and does not drink alcohol.  She denies any stress, other than being a caretaker for her mom and brother.  She feels that she has been more forgetful.  She got worse with her forgetfulness when she tried topiramate which has since then been stopped.  She does not take any over-the-counter medication for her headaches.  She has nocturia about once or twice per average night.  She goes to bed between 9 and 10 and rise time is around 8.  Her Epworth sleepiness score is 2 out of 24.  She is not aware of any family history of sleep apnea.  She had a eye examination centimeters ago eyeglasses.  He has no family history of migraines, she has 1 daughter, no history of migraines.  I reviewed your office note from 01/26/2022.  She  has been followed by Dr. Phillips Odor and has tried for migraine prevention propranolol as well as Topamax.  She had side effects with the Topamax including cognitive side effects.  Neurologic office notes are not available for my review today.  She has a remote history of stroke with left basal ganglia infarct and had seen Dr. Rosalin Hawking in the past.   She has tried sumatriptan for acute management of headaches which was not effective.  She had a recent brain MRI without contrast through [facility] on 11/11/2021 with indication of hemiparesis, unspecified hemiparesis etiology, unspecified laterality.  New daily persistent headache.  Patient reports headaches for 6 weeks.  I reviewed the results: IMPRESSION: No evidence of acute intracranial abnormality.   Known chronic small-vessel infarct within the left corona radiata and basal ganglia.   Mild-to-moderate chronic small vessel ischemic changes elsewhere within the cerebral white matter, progressed from the prior brain MRI of 04/25/2015.  She had a prior brain MRI without contrast through [facility] on 04/25/2015 and I reviewed the results: IMPRESSION: Acute nonhemorrhagic infarct extends from the posterior left lenticular nucleus to posterior left corona radiata.   Very mild small vessel disease type changes.  She had a head CT without contrast through [facility] ED on 10/13/2021 with indication of worsening headache, memory issues and I reviewed the results: IMPRESSION: Stable chronic white matter microvascular ischemic changes.   No interval  change or acute process by noncontrast CT.  She had a head CT without contrast through [facility] ED on 03/16/2017 with indication of chest pain and headaches following recent dental procedure.  I reviewed the results: IMPRESSION: No acute abnormality noted.  She had a remote CT angiogram of the head and neck with and without contrast on 04/25/2015 and I reviewed the results:IMPRESSION: 1. No  emergent large vessel occlusion. Preliminary report of these findings discussed in person with Dr. Aram Beecham at 1053 hours. 2. No significant stenosis. ICA siphon calcified plaque, with little to no atherosclerosis elsewhere. 3. CT head without contrast reported separately.   Her Past Medical History Is Significant For: Past Medical History:  Diagnosis Date   CVA (cerebral vascular accident) ([place])    Gastritis    Headache    Hyperlipidemia    Hypertension    Migraine     Her Past Surgical History Is Significant For: Past Surgical History:  Procedure Laterality Date   ELBOW SURGERY Right     Her Family History Is Significant For: Family History  Problem Relation Finley of Onset   Hypertension Mother    Heart disease Mother    Heart disease Father        MI   Parkinson's disease Sister    Parkinson's disease Brother    Stomach cancer Maternal Grandmother    Stomach cancer Maternal Aunt    Colon cancer Neg Hx    Esophageal cancer Neg Hx    Rectal cancer Neg Hx    Breast cancer Neg Hx    Migraines Neg Hx     Her Social History Is Significant For: Social History   Socioeconomic History   Marital status: Single    Spouse name: Not on file   Number of children: Not on file   Years of education: Not on file   Highest education level: High school graduate  Occupational History   Occupation: retired    Fish farm manager: [employer]  Tobacco Use   Smoking status: Never   Smokeless tobacco: Never  Vaping Use   Vaping Use: Never used  Substance and Sexual Activity   Alcohol use: Yes    Comment: 1 drink couple times of year   Drug use: No   Sexual activity: Never  Other Topics Concern   Not on file  Social History Narrative   Stays with her mother and disabled brother   Left handed   Caffeine: tea, maybe 1 or 2 cups/day   Social Determinants of Health   Financial Resource Strain: Finley Risk  (12/09/2021)   Overall Financial Resource Strain (CARDIA)    Difficulty of Paying  Living Expenses: Not hard at all  Food Insecurity: No Food Insecurity (12/09/2021)   Hunger Vital Sign    Worried About Running Out of Food in the Last Year: Never true    Ran Out of Food in the Last Year: Never true  Transportation Needs: No Transportation Needs (12/09/2021)   PRAPARE - Hydrologist (Medical): No    Lack of Transportation (Non-Medical): No  Physical Activity: Insufficiently Active (12/09/2021)   Exercise Vital Sign    Days of Exercise per Week: 7 days    Minutes of Exercise per Session: 20 min  Stress: No Stress Concern Present (12/09/2021)   [place]    Feeling of Stress : Not at all  Social Connections: Moderately Integrated (12/09/2021)   Social Connection and Isolation Panel [  NHANES]    Frequency of Communication with Friends and Family: More than three times a week    Frequency of Social Gatherings with Friends and Family: Twice a week    Attends Religious Services: More than 4 times per year    Active Member of [employer] or Organizations: Yes    Attends Music therapist: More than 4 times per year    Marital Status: Never married    Her Allergies Are:  No Known Allergies:   Her Current Medications Are:  Outpatient Encounter Medications as of 03/16/2022  Medication Sig   amLODipine (NORVASC) 5 MG tablet Take 1 tablet (5 mg total) by mouth daily.   atorvastatin (LIPITOR) 20 MG tablet TAKE 1 TABLET BY MOUTH ONCE DAILY 6 IN THE EVENING . APPOINTMENT REQUIRED FOR FUTURE REFILLS   EQ ASPIRIN ADULT Finley DOSE 81 MG EC tablet Take 1 tablet by mouth once daily   hydrocortisone 1 % lotion Apply 1 application topically 2 (two) times daily.   meloxicam (MOBIC) 7.5 MG tablet TAKE 1 TABLET BY MOUTH EVERY 12 HOURS WITH FOOD AS NEEDED FOR PAIN   metoprolol tartrate (LOPRESSOR) 25 MG tablet Take 1 tablet (25 mg total) by mouth 2 (two) times daily. (NEEDS TO BE SEEN BEFORE NEXT  REFILL)   triamcinolone cream (KENALOG) 0.1 % Apply 1 Application topically 2 (two) times daily.   Vitamin D, Ergocalciferol, (DRISDOL) 1.25 MG (50000 UNIT) CAPS capsule Take 1 capsule (50,000 Units total) by mouth every 7 (seven) days.   SUMAtriptan (IMITREX) 100 MG tablet Take 1 tablet (100 mg total) by mouth every 2 (two) hours as needed for migraine. May repeat in 2 hours if headache persists or recurs. (Patient not taking: Reported on 03/16/2022)   topiramate (TOPAMAX) 25 MG tablet Take 25 mg by mouth 2 (two) times daily. (Patient not taking: Reported on 03/16/2022)   No facility-administered encounter medications on file as of 03/16/2022.  :   Review of Systems:  Out of a complete 14 point review of systems, all are reviewed and negative with the exception of these symptoms as listed below:   Review of Systems  Neurological:        Patient is here alone for migraine evaluation. She states they have been going on maybe 4+ months. She states she was placed on Topamax but had a side effect of not being able to remember well so it was stopped. She states her memory isn't completely back. ESS 2.     Objective:  Neurological Exam  Physical Exam Physical Examination:   Vitals:   03/16/22 1429  BP: 140/83  Pulse: 64    General Examination: The patient is a very pleasant 71 y.o. female in no acute distress. She appears well-developed and well-nourished and well groomed.   HEENT: Normocephalic, atraumatic, pupils are equal, round and reactive to light, corrective eyeglasses in place, dense bilateral cataracts noted. Extraocular tracking is good without limitation to gaze excursion or nystagmus noted. Hearing is grossly intact. Face is symmetric with normal facial animation. Speech is clear with no dysarthria noted. There is no hypophonia. There is no lip, neck/head, jaw or voice tremor. Neck is supple with full range of passive and active motion. There are no carotid bruits on auscultation.  Oropharynx exam reveals: mild mouth dryness, adequate dental hygiene with partial plate on top.  Moderate airway crowding noted secondary to wider uvula, tonsils on the smaller side, small airway entry, Mallampati class II.  Neck circumference of 13  three-quarter inches.  Slight underbite noted.  Tongue protrudes centrally and palate elevates symmetrically.   Chest: Clear to auscultation without wheezing, rhonchi or crackles noted.  Heart: S1+S2+0, regular and normal without murmurs, rubs or gallops noted.   Abdomen: Soft, non-tender and non-distended with normal bowel sounds appreciated on auscultation.  Extremities: There is no pitting edema in the distal lower extremities bilaterally.  Good pedal pulses.  Skin: Warm and dry without trophic changes noted.   Musculoskeletal: exam reveals no obvious joint deformities.   Neurologically:  Mental status: The patient is awake, alert and oriented in all 4 spheres. Her immediate and remote memory, attention, language skills and fund of knowledge are appropriate. There is no evidence of aphasia, agnosia, apraxia or anomia. Speech is clear with normal prosody and enunciation. Thought process is linear. Mood is normal and affect is normal.  Cranial nerves II - XII are as described above under HEENT exam.  Motor exam: Normal bulk, strength and tone is noted. There is no drift, rebound, or tremor, Romberg is negative. Reflexes are 1+ throughout, toes are downgoing bilaterally. Fine motor skills and coordination: grossly intact.  Cerebellar testing: No dysmetria or intention tremor. There is no truncal or gait ataxia.  Normal finger-to-nose and normal heel-to-shin. Sensory exam: intact to light touch in the upper and lower extremities.  Gait, station and balance: She stands easily. No veering to one side is noted. No leaning to one side is noted. Posture is Finley-appropriate and stance is narrow based. Gait shows normal stride length and normal pace. No  problems turning are noted.   Assessment and Plan:   In summary, Krista Finley is a very pleasant 71 y.o.-year old female with an underlying medical history of hypertension, hyperlipidemia, vitamin D deficiency, history of stroke, and overweight state, who presents for evaluation of her recurrent headaches of 4 months duration.  She has nearly daily headaches, often wakes up with a headache, history is not telltale for migraines.  She may have a component of tension headaches what with her stress is a caretaker but her stress level has not really changed in the past few months.  Underlying obstructive sleep disordered breathing is a certain possibility as she wakes up with a headache, endorses nocturia and has a crowded airway.  In light of her stroke history, ruling out sleep apnea would make sense. She has tried topiramate for migraine prevention recently and had side effects.  She did not have any improvement with sumatriptan hand.  Before we embark on any other preventative or abortive migraine medication, I suggested we proceed with a sleep study.  I talked to the patient about obstructive sleep apnea, its prognosis and treatment options particularly at the use of a positive airway pressure device and I was able to show her a model for an AutoPap machine.  She would be willing to try AutoPap therapy if she has sleep apnea.  We talked about other options including a dental device or surgical options but mutually agreed to avoid any invasive options and she may not be a good candidate for dental device because of her partial dentures.  She would be willing to come in for an overnight polysomnogram if she finds someone to stay over at home as she has an elderly mom and a brother that requires help.  I explained the differences between a laboratory attended sleep study versus home sleep testing.  We will pick up our discussion about treatment options after testing, and we will  plan to follow-up in clinic  accordingly.  We talked about headache triggers.  She is advised to start drinking more water as she has a tendency of not hydrating with water.  I answered all her questions today and she was in agreement with our plan.   Thank you very much for allowing me to participate in the care of this nice patient. If I can be of any further assistance to you please do not hesitate to call me at [phone].  Sincerely,   Krista Age, MD, PhD

## 2022-04-01 ENCOUNTER — Telehealth: Payer: Self-pay | Admitting: Neurology

## 2022-04-01 NOTE — Telephone Encounter (Signed)
HST no auth req'd per Ed M ref#[id number] (pt chose hst due to not able to be away from home).  Patient is scheduled at [facility] for 04/28/22 at 11:30 AM.  Mailed packet to the patient.

## 2022-04-17 ENCOUNTER — Other Ambulatory Visit: Payer: Self-pay | Admitting: Family

## 2022-04-28 ENCOUNTER — Ambulatory Visit (INDEPENDENT_AMBULATORY_CARE_PROVIDER_SITE_OTHER): Payer: Medicare Other | Admitting: Neurology

## 2022-04-28 DIAGNOSIS — R519 Headache, unspecified: Secondary | ICD-10-CM

## 2022-04-28 DIAGNOSIS — R0683 Snoring: Secondary | ICD-10-CM

## 2022-04-28 DIAGNOSIS — E663 Overweight: Secondary | ICD-10-CM

## 2022-04-28 DIAGNOSIS — R351 Nocturia: Secondary | ICD-10-CM

## 2022-04-28 DIAGNOSIS — Z8673 Personal history of transient ischemic attack (TIA), and cerebral infarction without residual deficits: Secondary | ICD-10-CM

## 2022-04-28 DIAGNOSIS — Z9189 Other specified personal risk factors, not elsewhere classified: Secondary | ICD-10-CM

## 2022-05-03 ENCOUNTER — Other Ambulatory Visit: Payer: Self-pay | Admitting: Family

## 2022-05-05 NOTE — Procedures (Signed)
   [facility]  HOME SLEEP TEST (Watch PAT) REPORT  STUDY DATE: 04/28/2022  DOB: [birth year]  MRN: [id number]  ORDERING CLINICIAN: Star Age, MD, PhD   REFERRING CLINICIAN: Sharion Balloon, FNP   CLINICAL INFORMATION/HISTORY: 71 year old right-handed woman with an underlying medical history of hypertension, hyperlipidemia, vitamin D deficiency, history of stroke, and overweight state, who reports recurrent headaches for the past 4 months, she has nearly daily headaches, describes these in the frontal area primarily, throbbing in nature but not associated with nausea or vomiting or photophobia.  She often wakes up with a headache.   Epworth sleepiness score: 2/24.  BMI: 27.5 kg/m  FINDINGS:   Sleep Summary:   Total Recording Time (hours, min): 8 hours, 52 min  Total Sleep Time (hours, min):  7 hours, 50 min  Percent REM (%):    23.7%   Respiratory Indices:   Calculated pAHI (per hour):  2.3/hour         REM pAHI:    5.5/hour       NREM pAHI: 1.2/hour  Central pAHI: 0/hour  Oxygen Saturation Statistics:    Oxygen Saturation (%) Mean: 95%   Minimum oxygen saturation (%):                 86%   O2 Saturation Range (%): 86-99%    O2 Saturation (minutes) <=88%: 0 min  Pulse Rate Statistics:   Pulse Mean (bpm):    66/min    Pulse Range (53-92/min)   IMPRESSION: Primary snoring   RECOMMENDATION:  This home sleep test does not demonstrate any significant obstructive or central sleep disordered breathing with a total AHI of 2.3/hour, O2 nadir of 86%.  Snoring was detected, mild to moderate range, intermittent. Treatment with a positive airway pressure device such as AutoPap or CPAP is not indicated, snoring may improve with avoidance of the supine sleep position and weight loss (where appropriate).  For disturbing snoring, an oral appliance through dentistry or orthodontics can be considered.  Other causes of the patient's symptoms, including  circadian rhythm disturbances, an underlying mood disorder, medication effect and/or an underlying medical problem cannot be ruled out based on this test. Clinical correlation is recommended. The patient should be cautioned not to drive, work at heights, or operate dangerous or heavy equipment when tired or sleepy. Review and reiteration of good sleep hygiene measures should be pursued with any patient.  The patient and her referring provider will be notified of the test results.  I certify that I have reviewed the raw data recording prior to the issuance of this report in accordance with the standards of the American Academy of Sleep Medicine (AASM).  INTERPRETING PHYSICIAN:   Star Age, MD, PhD  Board Certified in Neurology and Sleep Medicine  [facility] [address] [place], [place] [address] [phone]

## 2022-05-06 ENCOUNTER — Telehealth: Payer: Self-pay | Admitting: *Deleted

## 2022-05-06 ENCOUNTER — Other Ambulatory Visit: Payer: Self-pay | Admitting: *Deleted

## 2022-05-06 MED ORDER — AMITRIPTYLINE HCL 25 MG PO TABS: ORAL_TABLET | ORAL | 2 refills | Status: DC

## 2022-05-06 NOTE — Telephone Encounter (Addendum)
Spoke with patient and discussed the results of her HST as noted below. The patient verbalized understanding. She is amenable to try Amitriptyline as she is still experiencing recurrent headaches. We discussed a few of the possible side effects such as drowsiness, weight gain, GI upset. Pt aware a full list will be available with the medication from the pharmacy. I told her we would call her to schedule a follow-up and we will send the prescription to her pharmacy.   Spoke with Dr Rexene Alberts who prescribed Amitriptyline 25 mg tablet with the following instructions: Take 1/2 tablet every night for 2 weeks then increase to 1 tablet every night. Follow-up with NP in 2-3 months. Amitriptyline Rx sent to pharmacy per v.o. Dr Rexene Alberts. Spoke with patient and reviewed instructions. She requested afternoon for follow-up appointment. I have scheduled the patient with Janett Billow NP on 12/26 at 2:15 PM arrival 1:45 PM. Pt was advised to call sooner if needed. She verbalized appreciation and understanding.   ----- Message from Star Age, MD sent at 05/05/2022  6:17 PM EDT ----- Patient referred by PCP for recurrent headaches.  She had a home sleep test on 04/28/2022.  Please advise patient that her sleep test at home did not indicate any significant sleep apnea.  She had intermittent snoring.  Treatment with a CPAP is not necessary.  If she continues to have recurrent headaches, we may try her on low-dose amitriptyline with gradual increase.  If she would like to get started on a prescription, I would be happy to send that into her pharmacy.  Please let me know.  We will arrange a follow-up accordingly.   Thanks,  Star Age, MD, PhD [facility])

## 2022-06-02 IMAGING — MG MM DIGITAL SCREENING BILAT W/ TOMO AND CAD
6 of 10 series · 6 of 30 positions shown · non-contrast
Comparison: Previous exam(s).

CLINICAL DATA: Screening.

EXAM:
DIGITAL SCREENING BILATERAL MAMMOGRAM WITH TOMOSYNTHESIS AND CAD
TECHNIQUE: Bilateral screening digital craniocaudal and mediolateral oblique
mammograms were obtained. Bilateral screening digital breast
tomosynthesis was performed. The images were evaluated with
computer-aided detection.

[L CC synth-2D]
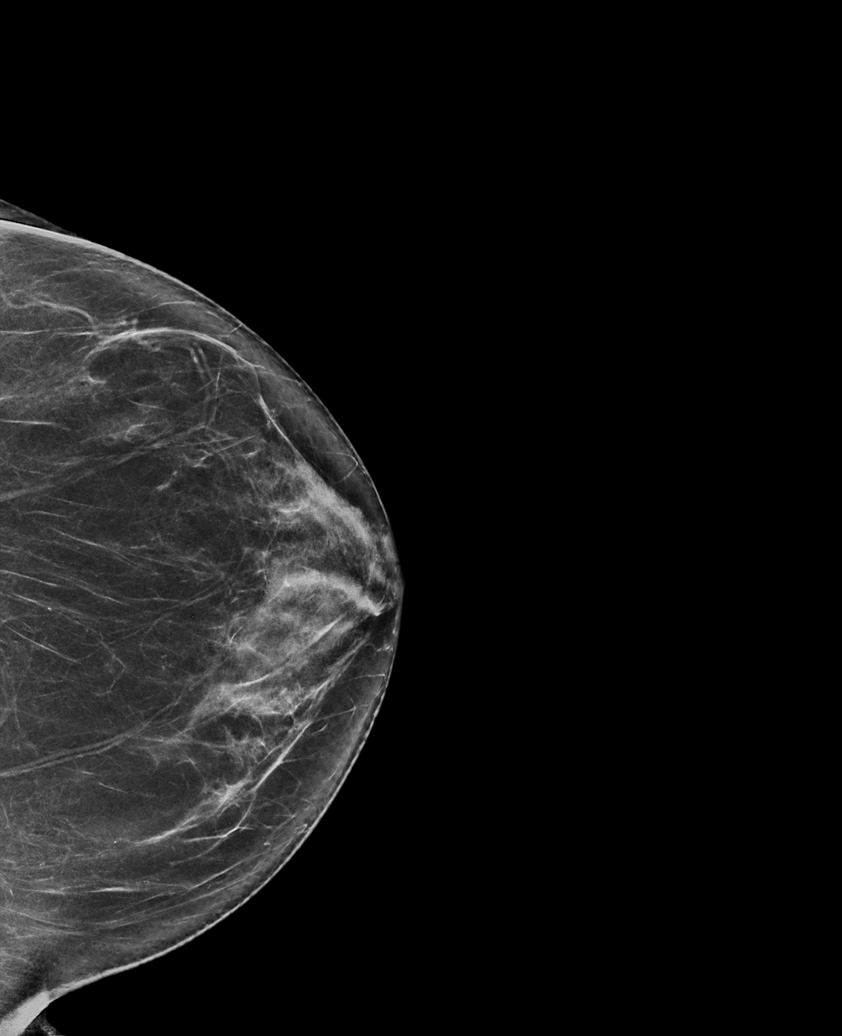

[R MLO synth-2D]
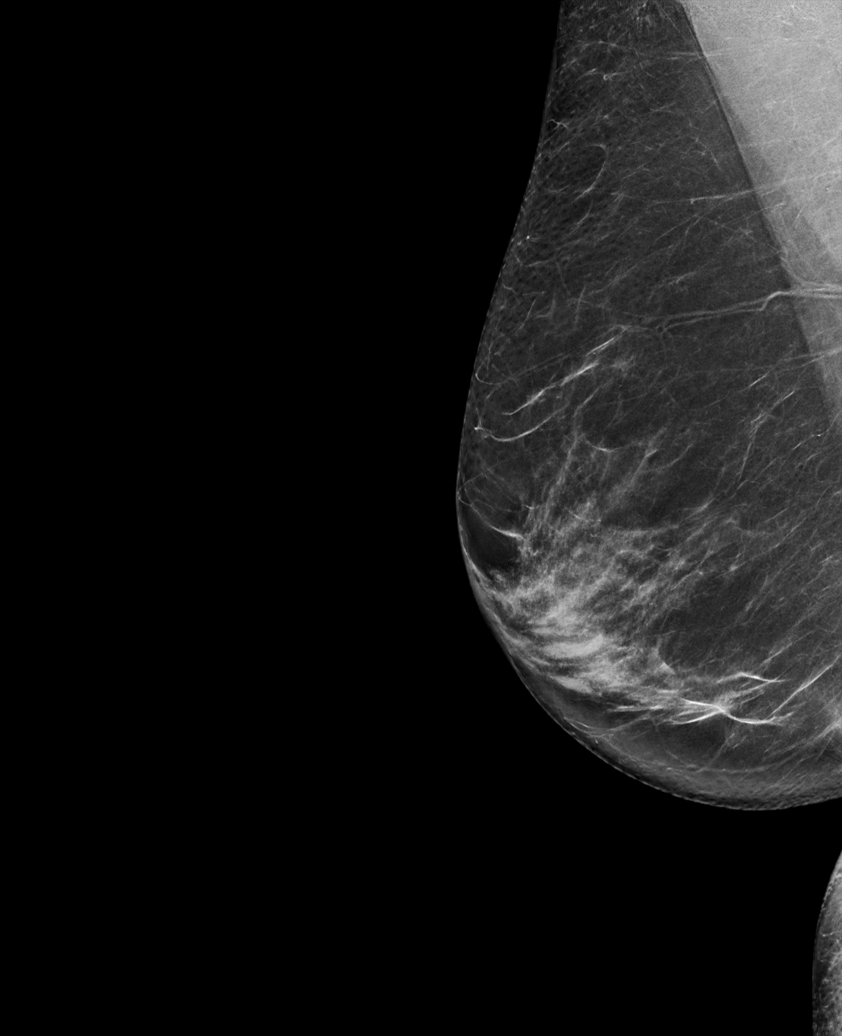

[L MLO synth-2D (1 of 2)]
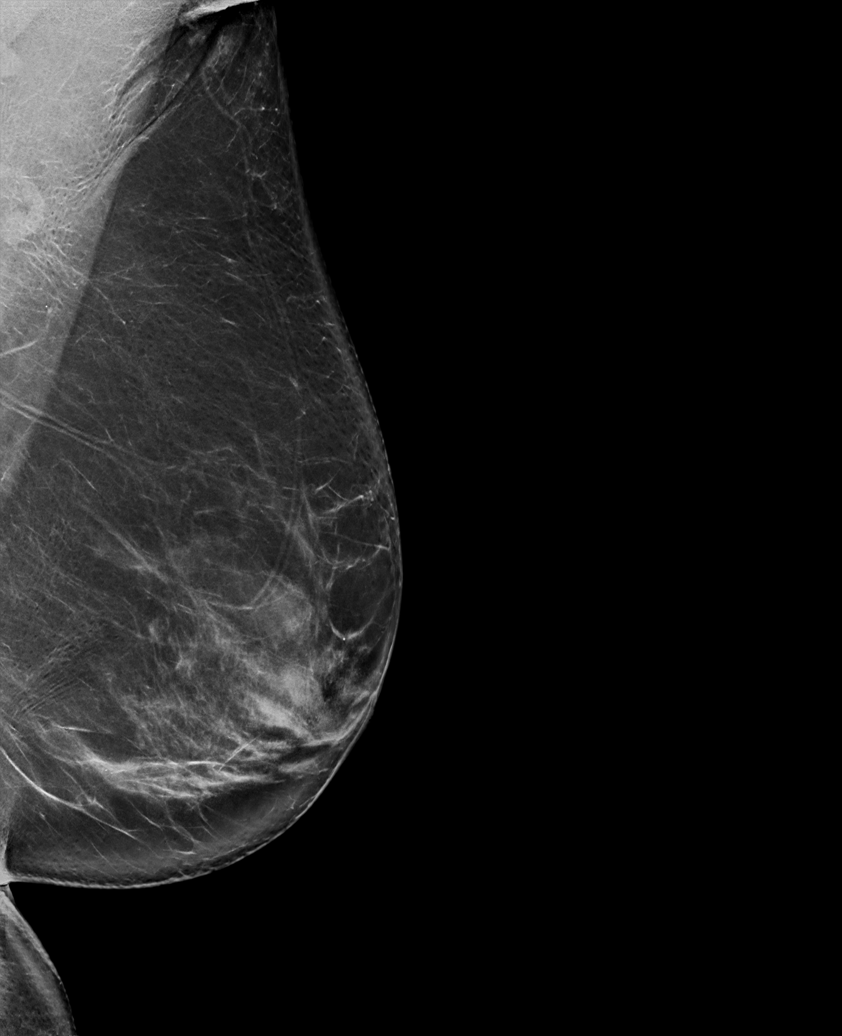

[R CC synth-2D]
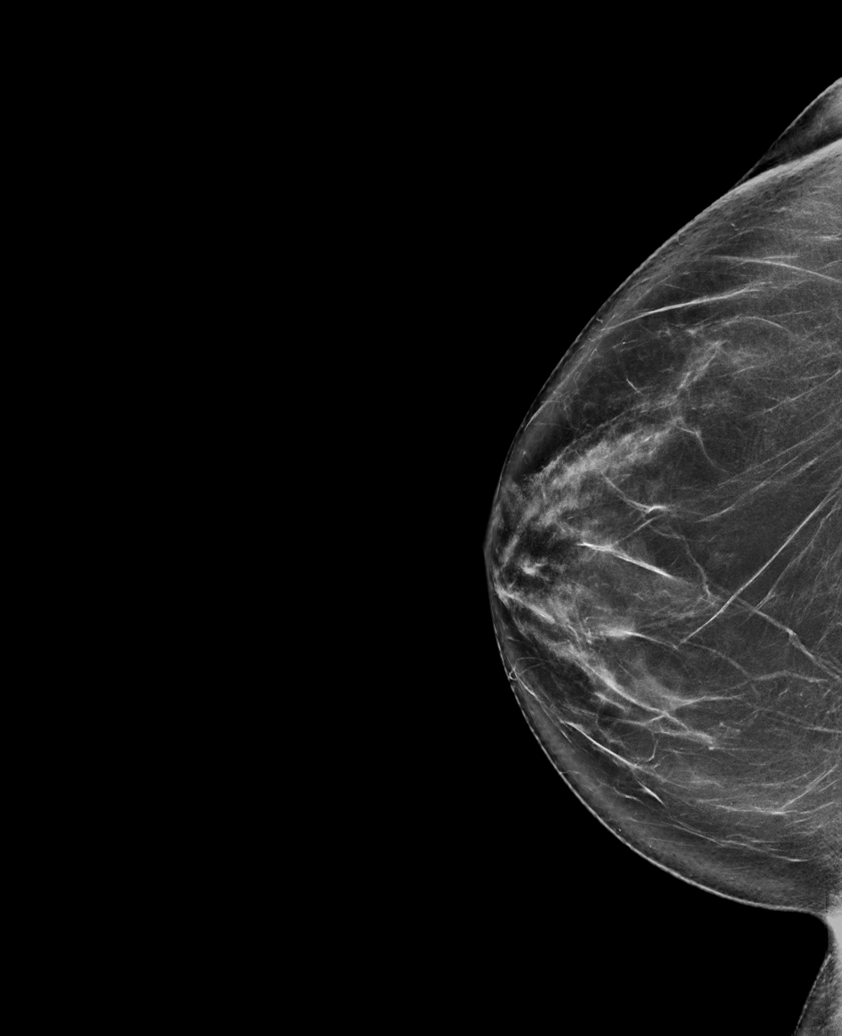

[L MLO synth-2D (2 of 2)]
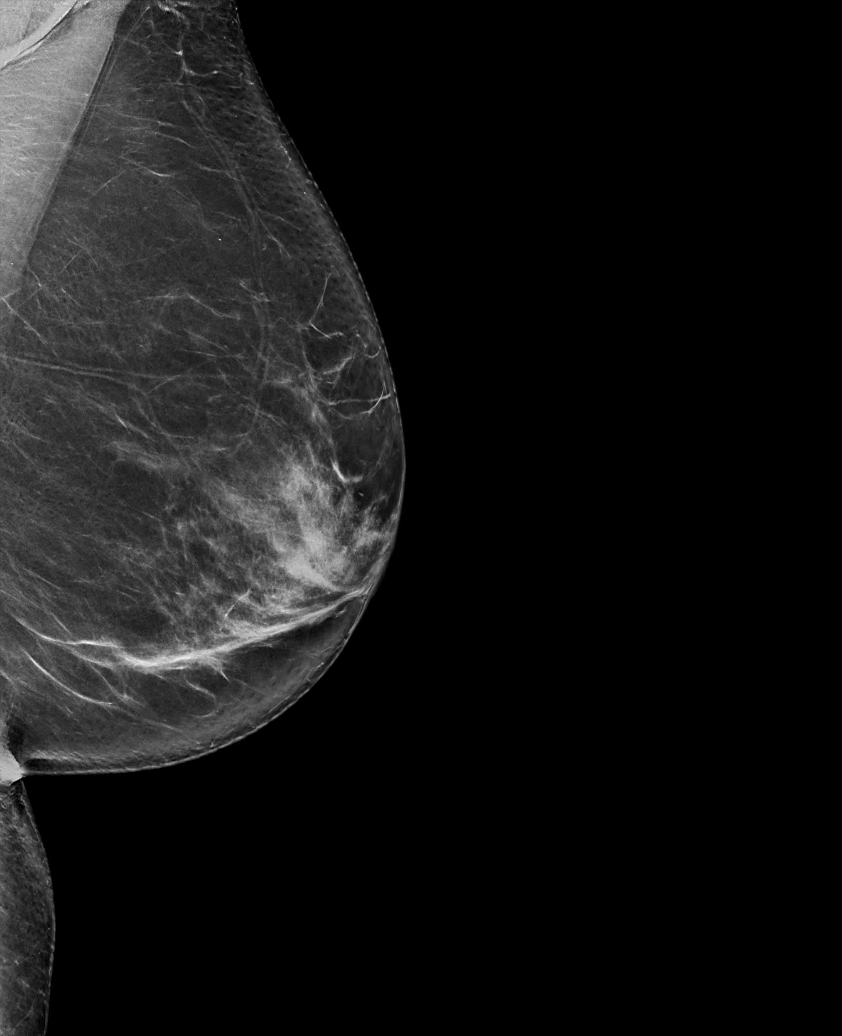

[L CC tomo · tomo slice 41/81.0]
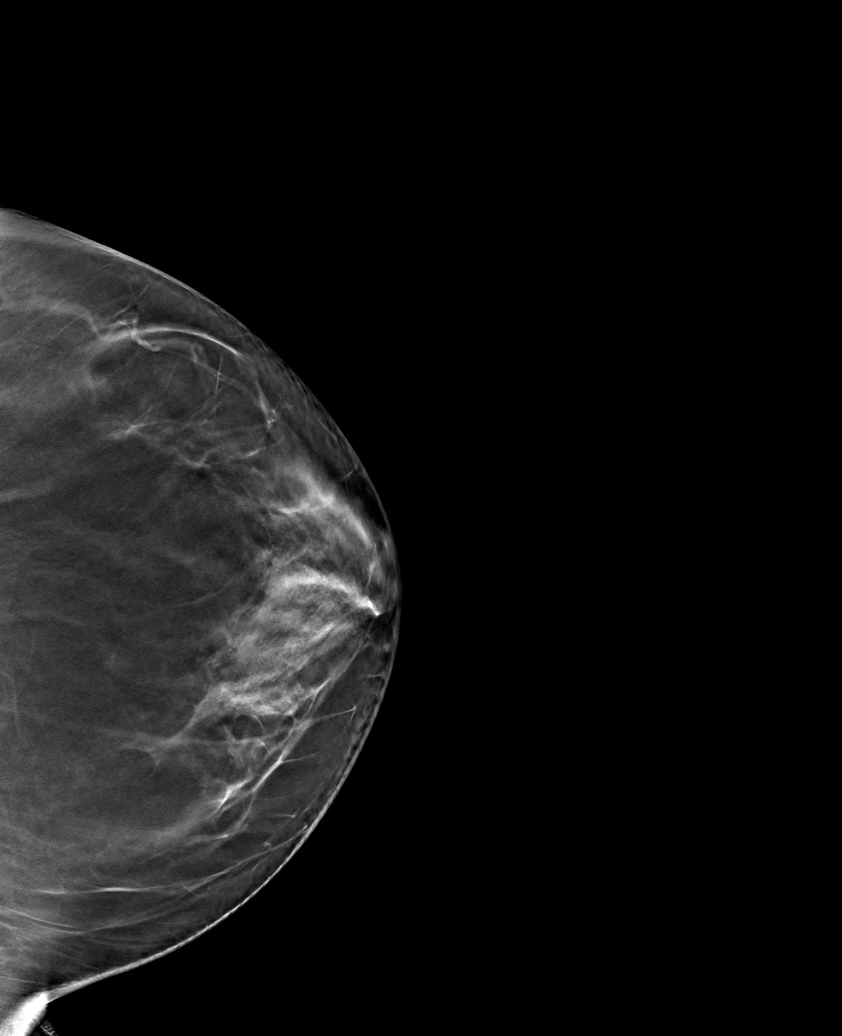

[6 of 30 positions shown; findings below may reference images not displayed]

ACR Breast Density Category b: There are scattered areas of
fibroglandular density.
FINDINGS: There are no findings suspicious for malignancy.
IMPRESSION: No mammographic evidence of malignancy. A result letter of this
screening mammogram will be mailed directly to the patient.

RECOMMENDATION:
Screening mammogram in one year. (Code:51-O-LD2)

BI-RADS CATEGORY  1: Negative.

## 2022-06-10 ENCOUNTER — Other Ambulatory Visit: Payer: Self-pay | Admitting: Family

## 2022-06-10 DIAGNOSIS — S29011A Strain of muscle and tendon of front wall of thorax, initial encounter: Secondary | ICD-10-CM

## 2022-06-15 ENCOUNTER — Other Ambulatory Visit: Payer: Self-pay | Admitting: Neurology

## 2022-06-16 ENCOUNTER — Other Ambulatory Visit: Payer: Self-pay | Admitting: *Deleted

## 2022-06-16 MED ORDER — AMITRIPTYLINE HCL 25 MG PO TABS: 25.0000 mg | ORAL_TABLET | Freq: Every day | ORAL | 3 refills | Status: DC

## 2022-08-06 ENCOUNTER — Other Ambulatory Visit: Payer: Self-pay | Admitting: Family

## 2022-08-13 NOTE — Progress Notes (Deleted)
[facility] [address] [place] [place] [address] [phone]       OFFICE FOLLOW UP NOTE  [name] Date of Birth:  February 19, 1951 Medical Record Number:  [id number]   Reason for visit:     SUBJECTIVE:   CHIEF COMPLAINT:  No chief complaint on file.   Follow-up visit:  Prior visit: 03/16/2022 with Dr. Rexene Alberts  Brief HPI: Krista Finley is a 71 y.o. female with history of left BG infarct who was seen by Dr. Rexene Alberts for recurrent headaches that started around 10/2021, occurring nearly daily primarily frontal area, throbbing in nature not associated with nausea/vomiting or photophobia.  Can frequently wake with headache.  She was previously followed by Dr. Merlene Laughter and has previously tried propranolol and topiramate as well as sumatriptan.   At prior visit, recommended proceeding with sleep study to rule out sleep apnea prior to initiating preventative or abortive migraine medication.  Completed sleep study 04/28/2022 which did not show any significant obstructive or central sleep apnea therefore treatment not indicated.  She was started on amitriptyline for preventative.  Interval history:       ROS:   14 system review of systems performed and negative with exception of ***  PMH:  Past Medical History:  Diagnosis Date   CVA (cerebral vascular accident) ([place])    Gastritis    Headache    Hyperlipidemia    Hypertension    Migraine     PSH:  Past Surgical History:  Procedure Laterality Date   ELBOW SURGERY Right     Social History:  Social History   Socioeconomic History   Marital status: Single    Spouse name: Not on file   Number of children: Not on file   Years of education: Not on file   Highest education level: High school graduate  Occupational History   Occupation: retired    Fish farm manager: [employer]  Tobacco Use   Smoking status: Never   Smokeless tobacco: Never  Vaping Use   Vaping Use: Never used  Substance and  Sexual Activity   Alcohol use: Yes    Comment: 1 drink couple times of year   Drug use: No   Sexual activity: Never  Other Topics Concern   Not on file  Social History Narrative   Stays with her mother and disabled brother   Left handed   Caffeine: tea, maybe 1 or 2 cups/day   Social Determinants of Health   Financial Resource Strain: Low Risk  (12/09/2021)   Overall Financial Resource Strain (CARDIA)    Difficulty of Paying Living Expenses: Not hard at all  Food Insecurity: No Food Insecurity (12/09/2021)   Hunger Vital Sign    Worried About Running Out of Food in the Last Year: Never true    [place] in the Last Year: Never true  Transportation Needs: No Transportation Needs (12/09/2021)   PRAPARE - Hydrologist (Medical): No    Lack of Transportation (Non-Medical): No  Physical Activity: Insufficiently Active (12/09/2021)   Exercise Vital Sign    Days of Exercise per Week: 7 days    Minutes of Exercise per Session: 20 min  Stress: No Stress Concern Present (12/09/2021)   [place]    Feeling of Stress : Not at all  Social Connections: Moderately Integrated (12/09/2021)   Social Connection and Isolation Panel [NHANES]    Frequency of Communication with Friends  and Family: More than three times a week    Frequency of Social Gatherings with Friends and Family: Twice a week    Attends Religious Services: More than 4 times per year    Active Member of [employer] or Organizations: Yes    Attends Music therapist: More than 4 times per year    Marital Status: Never married  Intimate Partner Violence: Not At Risk (12/09/2021)   Humiliation, Afraid, Rape, and Kick questionnaire    Fear of Current or Ex-Partner: No    Emotionally Abused: No    Physically Abused: No    Sexually Abused: No    Family History:  Family History  Problem Relation Age of Onset   Hypertension  Mother    Heart disease Mother    Heart disease Father        MI   Parkinson's disease Sister    Parkinson's disease Brother    Stomach cancer Maternal Grandmother    Stomach cancer Maternal Aunt    Colon cancer Neg Hx    Esophageal cancer Neg Hx    Rectal cancer Neg Hx    Breast cancer Neg Hx    Migraines Neg Hx     Medications:   Current Outpatient Medications on File Prior to Visit  Medication Sig Dispense Refill   amitriptyline (ELAVIL) 25 MG tablet Take 1 tablet (25 mg total) by mouth at bedtime. 30 tablet 3   amLODipine (NORVASC) 5 MG tablet Take 1 tablet (5 mg total) by mouth daily. 90 tablet 3   atorvastatin (LIPITOR) 20 MG tablet TAKE 1 TABLET BY MOUTH ONCE DAILY 6 IN THE EVENING . APPOINTMENT REQUIRED FOR FUTURE REFILLS 90 tablet 3   EQ ASPIRIN ADULT LOW DOSE 81 MG tablet Take 1 tablet by mouth once daily 90 tablet 0   hydrocortisone 1 % lotion Apply 1 application topically 2 (two) times daily. 118 mL 0   meloxicam (MOBIC) 7.5 MG tablet TAKE 1 TABLET BY MOUTH EVERY 12 HOURS WITH FOOD AS NEEDED FOR PAIN   (NEEDS TO BE SEEN BEFORE NEXT REFILL) 60 tablet 0   metoprolol tartrate (LOPRESSOR) 25 MG tablet Take 1 tablet (25 mg total) by mouth 2 (two) times daily. (NEEDS TO BE SEEN BEFORE NEXT REFILL) 180 tablet 3   SUMAtriptan (IMITREX) 100 MG tablet TAKE 1 TABLET BY MOUTH EVERY 2 HOURS AS NEEDED FOR MIGRAINE. MAY REPEAT IN 2 HOURS IF HEADACHE PERSISTS OR RECURS. 10 tablet 0   topiramate (TOPAMAX) 25 MG tablet Take 25 mg by mouth 2 (two) times daily. (Patient not taking: Reported on 03/16/2022)     triamcinolone cream (KENALOG) 0.1 % Apply 1 Application topically 2 (two) times daily. 30 g 0   Vitamin D, Ergocalciferol, (DRISDOL) 1.25 MG (50000 UNIT) CAPS capsule Take 1 capsule by mouth once a week 12 capsule 0   No current facility-administered medications on file prior to visit.    Allergies:  No Known Allergies    OBJECTIVE:  Physical Exam  There were no vitals filed for  this visit. There is no height or weight on file to calculate BMI. No results found.   General: well developed, well nourished, seated, in no evident distress Head: head normocephalic and atraumatic.   Neck: supple with no carotid or supraclavicular bruits Cardiovascular: regular rate and rhythm, no murmurs Musculoskeletal: no deformity Skin:  no rash/petichiae Vascular:  Normal pulses all extremities   Neurologic Exam Mental Status: Awake and fully alert. Oriented to place and  time. Recent and remote memory intact. Attention span, concentration and fund of knowledge appropriate. Mood and affect appropriate.  Cranial Nerves: Pupils equal, briskly reactive to light. Extraocular movements full without nystagmus. Visual fields full to confrontation. Hearing intact. Facial sensation intact. Face, tongue, palate moves normally and symmetrically.  Motor: Normal bulk and tone. Normal strength in all tested extremity muscles Sensory.: intact to touch , pinprick , position and vibratory sensation.  Coordination: Rapid alternating movements normal in all extremities. Finger-to-nose and heel-to-shin performed accurately bilaterally. Gait and Station: Arises from chair without difficulty. Stance is normal. Gait demonstrates normal stride length and balance without use of AD. Tandem walk and heel toe without difficulty.  Reflexes: 1+ and symmetric. Toes downgoing.         ASSESSMENT/PLAN: Krista Finley is a 71 y.o. year old female with recurrent migraine headaches worsening around 10/2021.  Patient of Dr. Rexene Alberts.  Completed sleep study 04/2022 which did not show evidence of sleep apnea therefore started on amitriptyline for headache prevention.   Prevention:  Rescue:      Follow up in *** or call earlier if needed   CC:  PCP: Sharion Balloon, FNP    I spent *** minutes of face-to-face and non-face-to-face time with patient.  This included previsit chart review, lab review, study  review, order entry, electronic health record documentation, patient education regarding ***   Frann Rider, AGNP-BC  [facility] [address] [place], [place] [address]  Phone [phone] Fax [phone] Note: This document was prepared with digital dictation and possible smart phrase technology. Any transcriptional errors that result from this process are unintentional.

## 2022-08-18 ENCOUNTER — Ambulatory Visit: Payer: Medicare Other | Admitting: Adult Health

## 2022-08-18 ENCOUNTER — Encounter: Payer: Self-pay | Admitting: Adult Health

## 2022-08-18 ENCOUNTER — Other Ambulatory Visit: Payer: Self-pay | Admitting: Family

## 2022-08-18 DIAGNOSIS — I10 Essential (primary) hypertension: Secondary | ICD-10-CM

## 2022-08-23 ENCOUNTER — Other Ambulatory Visit: Payer: Self-pay | Admitting: Family

## 2022-08-28 ENCOUNTER — Encounter: Payer: Self-pay | Admitting: Family

## 2022-08-28 ENCOUNTER — Ambulatory Visit (INDEPENDENT_AMBULATORY_CARE_PROVIDER_SITE_OTHER): Payer: BLUE CROSS/BLUE SHIELD | Admitting: Family

## 2022-08-28 ENCOUNTER — Telehealth: Payer: Self-pay | Admitting: Family

## 2022-08-28 VITALS — BP 125/73 | HR 83 | Temp 96.7°F | Ht 64.0 in | Wt 166.6 lb

## 2022-08-28 DIAGNOSIS — I10 Essential (primary) hypertension: Secondary | ICD-10-CM | POA: Diagnosis not present

## 2022-08-28 DIAGNOSIS — E785 Hyperlipidemia, unspecified: Secondary | ICD-10-CM | POA: Diagnosis not present

## 2022-08-28 DIAGNOSIS — G43109 Migraine with aura, not intractable, without status migrainosus: Secondary | ICD-10-CM | POA: Diagnosis not present

## 2022-08-28 DIAGNOSIS — E559 Vitamin D deficiency, unspecified: Secondary | ICD-10-CM | POA: Diagnosis not present

## 2022-08-28 LAB — CMP14+EGFR
ALT: 7 IU/L (ref 0–32)
AST: 15 IU/L (ref 0–40)
Albumin/Globulin Ratio: 1.6 (ref 1.2–2.2)
Albumin: 4.2 g/dL (ref 3.9–4.9)
Alkaline Phosphatase: 104 IU/L (ref 44–121)
BUN/Creatinine Ratio: 12 (ref 12–28)
BUN: 13 mg/dL (ref 8–27)
Bilirubin Total: 0.5 mg/dL (ref 0.0–1.2)
CO2: 25 mmol/L (ref 20–29)
Calcium: 9.2 mg/dL (ref 8.7–10.3)
Chloride: 105 mmol/L (ref 96–106)
Creatinine, Ser: 1.11 mg/dL — ABNORMAL HIGH (ref 0.57–1.00)
Globulin, Total: 2.6 g/dL (ref 1.5–4.5)
Glucose: 90 mg/dL (ref 70–99)
Potassium: 3.3 mmol/L — ABNORMAL LOW (ref 3.5–5.2)
Sodium: 145 mmol/L — ABNORMAL HIGH (ref 134–144)
Total Protein: 6.8 g/dL (ref 6.0–8.5)
eGFR: 54 mL/min/{1.73_m2} — ABNORMAL LOW (ref >59)

## 2022-08-28 NOTE — Patient Instructions (Signed)
Tension Headache, Adult A tension headache is a feeling of pain, pressure, or aching over the front and sides of the head. The pain can be dull, or it can feel tight. There are two types of tension headache: Episodic tension headache. This is when the headaches happen fewer than 15 days a month. Chronic tension headache. This is when the headaches happen more than 15 days a month during a 42-monthperiod. A tension headache can last from 30 minutes to several days. It is the most common kind of headache. Tension headaches are not normally associated with nausea or vomiting, and they do not get worse with physical activity. What are the causes? The exact cause of this condition is not known. Tension headaches are often triggered by stress, anxiety, or depression. Other triggers may include: Alcohol. Too much caffeine or caffeine withdrawal. Respiratory infections, such as colds, flu, or sinus infections. Dental problems or teeth clenching. Fatigue. Holding your head and neck in the same position for a long period of time, such as while using a computer. Smoking. Arthritis of the neck. What are the signs or symptoms? Symptoms of this condition include: A feeling of pressure or tightness around the head. Dull, aching head pain. Pain over the front and sides of the head. Tenderness in the muscles of the head, neck, and shoulders. How is this diagnosed? This condition may be diagnosed based on your symptoms, your medical history, and a physical exam. If your symptoms are severe or unusual, you may have imaging tests, such as a CT scan or an MRI of your head. Your vision may also be checked. How is this treated? This condition may be treated with lifestyle changes and with medicines that help relieve symptoms. Follow these instructions at home: Managing pain Take over-the-counter and prescription medicines only as told by your health care provider. When you have a headache, lie down in a dark,  quiet room. If directed, put ice on your head and neck. To do this: Put ice in a plastic bag. Place a towel between your skin and the bag. Leave the ice on for 20 minutes, 2-3 times a day. Remove the ice if your skin turns bright red. This is very important. If you cannot feel pain, heat, or cold, you have a greater risk of damage to the area. If directed, apply heat to the back of your neck as often as told by your health care provider. Use the heat source that your health care provider recommends, such as a moist heat pack or a heating pad. Place a towel between your skin and the heat source. Leave the heat on for 20-30 minutes. Remove the heat if your skin turns bright red. This is especially important if you are unable to feel pain, heat, or cold. You have a greater risk of getting burned. Eating and drinking Eat meals on a regular schedule. If you drink alcohol: Limit how much you have to: 0-1 drink a day for women who are not pregnant. 0-2 drinks a day for men. Know how much alcohol is in your drink. In the [place] one drink equals one 12 oz bottle of beer (355 mL), one 5 oz glass of wine (148 mL), or one 1 oz glass of hard liquor (44 mL). Drink enough fluid to keep your urine pale yellow. Decrease your caffeine intake, or stop using caffeine. Lifestyle Get 7-9 hours of sleep each night, or get the amount of sleep recommended by your health care provider. At bedtime,  remove computers, phones, and tablets from your room. Find ways to manage your stress. This may include: Exercise. Deep breathing exercises. Yoga. Listening to music. Positive mental imagery. Try to sit up straight and avoid tensing your muscles. Do not use any products that contain nicotine or tobacco. These include cigarettes, chewing tobacco, and vaping devices, such as e-cigarettes. If you need help quitting, ask your health care provider. General instructions  Avoid any headache triggers. Keep a journal to help  find out what may trigger your headaches. For example, write down: What you eat and drink. How much sleep you get. Any change to your diet or medicines. Keep all follow-up visits. This is important. Contact a health care provider if: Your headache does not get better. Your headache comes back. You are sensitive to sounds, light, or smells because of a headache. You have nausea or you vomit. Your stomach hurts. Get help right away if: You suddenly develop a severe headache, along with any of the following: A stiff neck. Nausea and vomiting. Confusion. Weakness in one part or one side of your body. Double vision or loss of vision. Shortness of breath. Rash. Unusual sleepiness. Fever or chills. Trouble speaking. Pain in your eye or ear. Trouble walking or balancing. Feeling faint or passing out. Summary A tension headache is a feeling of pain, pressure, or aching over the front and sides of the head. A tension headache can last from 30 minutes to several days. It is the most common kind of headache. This condition may be diagnosed based on your symptoms, your medical history, and a physical exam. This condition may be treated with lifestyle changes and with medicines that help relieve symptoms. This information is not intended to replace advice given to you by your health care provider. Make sure you discuss any questions you have with your health care provider. Document Revised: 05/09/2020 Document Reviewed: 05/09/2020 Elsevier Patient Education  [place].

## 2022-08-28 NOTE — Progress Notes (Signed)
Subjective:    Patient ID: Krista Finley, female    DOB: [birth year], 72 y.o.   MRN: [id number]  Chief Complaint  Patient presents with   Headache   Memory Loss   PT presents to the office today for chronic follow up. She has hx of CVA. She has been diagnosed with migraine and saw a Neurologists. She was started on Topamax. She is followed by Neurologists.   States since then she has had memory issues.  Headache  This is a chronic problem. The current episode started more than 1 year ago. The problem occurs daily. The problem has been unchanged. The pain is located in the Frontal and temporal region. The pain does not radiate. The pain quality is similar to prior headaches. The quality of the pain is described as aching. The pain is at a severity of 8/10. The pain is moderate. Pertinent negatives include no blurred vision, nausea, phonophobia or photophobia. Nothing aggravates the symptoms. She has tried antidepressants for the symptoms. The treatment provided mild relief. Her past medical history is significant for hypertension.  Hypertension This is a chronic problem. The current episode started more than 1 year ago. The problem has been resolved since onset. The problem is controlled. Associated symptoms include headaches and malaise/fatigue. Pertinent negatives include no blurred vision, peripheral edema or shortness of breath. Risk factors for coronary artery disease include sedentary lifestyle. The current treatment provides moderate improvement. There is no history of CVA or heart failure.  Hyperlipidemia This is a chronic problem. The current episode started more than 1 year ago. The problem is controlled. Pertinent negatives include no shortness of breath. Current antihyperlipidemic treatment includes statins. The current treatment provides moderate improvement of lipids. Risk factors for coronary artery disease include dyslipidemia, hypertension and a sedentary lifestyle.       Review of Systems  Constitutional:  Positive for malaise/fatigue.  Eyes:  Negative for blurred vision and photophobia.  Respiratory:  Negative for shortness of breath.   Gastrointestinal:  Negative for nausea.  Neurological:  Positive for headaches.  All other systems reviewed and are negative.      Objective:   Physical Exam Vitals reviewed.  Constitutional:      General: She is not in acute distress.    Appearance: She is well-developed.  HENT:     Head: Normocephalic and atraumatic.     Right Ear: External ear normal.  Eyes:     Pupils: Pupils are equal, round, and reactive to light.  Neck:     Thyroid: No thyromegaly.  Cardiovascular:     Rate and Rhythm: Normal rate and regular rhythm.     Heart sounds: Normal heart sounds. No murmur heard. Pulmonary:     Effort: Pulmonary effort is normal. No respiratory distress.     Breath sounds: Normal breath sounds. No wheezing.  Abdominal:     General: Bowel sounds are normal. There is no distension.     Palpations: Abdomen is soft.     Tenderness: There is no abdominal tenderness.  Musculoskeletal:        General: No tenderness. Normal range of motion.     Cervical back: Normal range of motion and neck supple.  Skin:    General: Skin is warm and dry.  Neurological:     Mental Status: She is alert and oriented to person, place, and time.     Cranial Nerves: No cranial nerve deficit.     Deep Tendon Reflexes: Reflexes are normal and  symmetric.  Psychiatric:        Behavior: Behavior normal.        Thought Content: Thought content normal.        Judgment: Judgment normal.       BP 125/73   Pulse 83   Temp (!) 96.7 F (35.9 C) (Temporal)   Ht '5\' 4"'$  (1.626 m)   Wt 166 lb 9.6 oz (75.6 kg)   SpO2 99%   BMI 28.60 kg/m      Assessment & Plan:  Krista Finley comes in today with chief complaint of Headache and Memory Loss   Diagnosis and orders addressed:  1. Essential hypertension -  CMP14+EGFR  2. Hyperlipidemia, unspecified hyperlipidemia type - CMP14+EGFR  3. Migraine with aura and without status migrainosus, not intractable - CMP14+EGFR  4. Vitamin D deficiency - CMP14+EGFR   Labs pending Pt will call back and let us know what medications she is taking.  Recommend calling neurologists for follow up, given she continues to have daily headaches.  Health Maintenance reviewed Diet and exercise encouraged  Follow up plan: 6 months    Evelina Dun, FNP

## 2022-08-28 NOTE — Telephone Encounter (Signed)
Pt was told to call back with list of medication that she does not have amitriptyline (ELAVIL) 25 MG tablet  topiramate (TOPAMAX) 25 MG tablet

## 2022-08-31 ENCOUNTER — Other Ambulatory Visit: Payer: Self-pay | Admitting: Family

## 2022-08-31 DIAGNOSIS — S29011A Strain of muscle and tendon of front wall of thorax, initial encounter: Secondary | ICD-10-CM

## 2022-08-31 MED ORDER — AMITRIPTYLINE HCL 25 MG PO TABS: 25.0000 mg | ORAL_TABLET | Freq: Every day | ORAL | 1 refills | Status: DC

## 2022-08-31 MED ORDER — POTASSIUM CHLORIDE CRYS ER 20 MEQ PO TBCR: 20.0000 meq | EXTENDED_RELEASE_TABLET | Freq: Every day | ORAL | 1 refills | Status: DC

## 2022-08-31 NOTE — Telephone Encounter (Signed)
Patient aware.

## 2022-08-31 NOTE — Telephone Encounter (Signed)
I have removed Topamax from med list. I have reordered Amitriptyline. Please have her restart this every night to see if this helps with her headaches.

## 2022-09-01 ENCOUNTER — Encounter: Payer: Self-pay | Admitting: Adult Health

## 2022-09-01 ENCOUNTER — Ambulatory Visit (INDEPENDENT_AMBULATORY_CARE_PROVIDER_SITE_OTHER): Payer: Medicare Other | Admitting: Adult Health

## 2022-09-01 VITALS — BP 160/88 | HR 89 | Ht 64.0 in | Wt 167.6 lb

## 2022-09-01 DIAGNOSIS — G44221 Chronic tension-type headache, intractable: Secondary | ICD-10-CM

## 2022-09-01 MED ORDER — AMITRIPTYLINE HCL 50 MG PO TABS
50.0000 mg | ORAL_TABLET | Freq: Every day | ORAL | 5 refills | Status: DC
Start: 2022-09-01 — End: 2022-12-29

## 2022-09-01 NOTE — Progress Notes (Signed)
[facility] [address] [place]. [place] [address] [phone]       OFFICE FOLLOW UP NOTE  [name] Date of Birth:  08-25-1950 Medical Record Number:  [id number]   Primary neurologist: Dr. Rexene Finley Reason for visit: Chronic headache    SUBJECTIVE:   CHIEF COMPLAINT:  Chief Complaint  Patient presents with   Follow-up    Pt in room #1 and alone. Pt here today for f/u on headaches.    Follow-up visit:  Prior visit: 03/16/2022 with Dr. Rexene Finley  Brief HPI: Krista Finley is a 72 y.o. female with history of left BG infarct who was seen by Dr. Rexene Finley for recurrent headaches that started around 10/2021, occurring nearly daily primarily frontal area, throbbing in nature not associated with nausea/vomiting or photophobia.  Can frequently wake with headache.  She was previously followed by Dr. Merlene Finley and has previously tried propranolol and topiramate as well as sumatriptan.   At prior visit, recommended proceeding with sleep study to rule out sleep apnea prior to initiating preventative or abortive migraine medication.  Completed sleep study 04/28/2022 which did not show any significant obstructive or central sleep apnea therefore treatment not indicated.  She was started on amitriptyline for preventative.   Interval history:  Reports no significant improvement after starting amitriptyline.  Has been experience about 3-4 headaches per week but worsened over the past week to daily headache. Still present over frontal/bilateral temporal area with throbbing sensation not associated with photophobia, phonophobia or N/V.  Denies any OTC overuse.  Blood pressure elevated today, occasionally monitors at home and typically stable.      ROS:   14 system review of systems performed and negative with exception of those listed in HPI  PMH:  Past Medical History:  Diagnosis Date   CVA (cerebral vascular accident) ([place])    Gastritis    Headache     Hyperlipidemia    Hypertension    Migraine     PSH:  Past Surgical History:  Procedure Laterality Date   ELBOW SURGERY Right     Social History:  Social History   Socioeconomic History   Marital status: Single    Spouse name: Not on file   Number of children: Not on file   Years of education: Not on file   Highest education level: High school graduate  Occupational History   Occupation: retired    Fish farm manager: [employer]  Tobacco Use   Smoking status: Never   Smokeless tobacco: Never  Vaping Use   Vaping Use: Never used  Substance and Sexual Activity   Alcohol use: Yes    Comment: 1 drink couple times of year   Drug use: No   Sexual activity: Never  Other Topics Concern   Not on file  Social History Narrative   Stays with her mother and disabled brother   Left handed   Caffeine: tea, maybe 1 or 2 cups/day   Social Determinants of Health   Financial Resource Strain: Low Risk  (12/09/2021)   Overall Financial Resource Strain (CARDIA)    Difficulty of Paying Living Expenses: Not hard at all  Food Insecurity: No Food Insecurity (12/09/2021)   Hunger Vital Sign    Worried About Running Out of Food in the Last Year: Never true    [place] in the Last Year: Never true  Transportation Needs: No Transportation Needs (12/09/2021)   PRAPARE - Hydrologist (Medical): No  Lack of Transportation (Non-Medical): No  Physical Activity: Insufficiently Active (12/09/2021)   Exercise Vital Sign    Days of Exercise per Week: 7 days    Minutes of Exercise per Session: 20 min  Stress: No Stress Concern Present (12/09/2021)   [place]    Feeling of Stress : Not at all  Social Connections: Moderately Integrated (12/09/2021)   Social Connection and Isolation Panel [NHANES]    Frequency of Communication with Friends and Family: More than three times a week    Frequency of Social  Gatherings with Friends and Family: Twice a week    Attends Religious Services: More than 4 times per year    Active Member of [employer] or Organizations: Yes    Attends Music therapist: More than 4 times per year    Marital Status: Never married  Intimate Partner Violence: Not At Risk (12/09/2021)   Humiliation, Afraid, Rape, and Kick questionnaire    Fear of Current or Ex-Partner: No    Emotionally Abused: No    Physically Abused: No    Sexually Abused: No    Family History:  Family History  Problem Relation Age of Onset   Hypertension Mother    Heart disease Mother    Heart disease Father        MI   Parkinson's disease Sister    Parkinson's disease Brother    Stomach cancer Maternal Grandmother    Stomach cancer Maternal Aunt    Colon cancer Neg Hx    Esophageal cancer Neg Hx    Rectal cancer Neg Hx    Breast cancer Neg Hx    Migraines Neg Hx     Medications:   Current Outpatient Medications on File Prior to Visit  Medication Sig Dispense Refill   amitriptyline (ELAVIL) 25 MG tablet Take 1 tablet (25 mg total) by mouth at bedtime. 90 tablet 1   amLODipine (NORVASC) 5 MG tablet Take 1 tablet (5 mg total) by mouth daily. (NEEDS TO BE SEEN BEFORE NEXT REFILL) 30 tablet 0   aspirin EC (EQ ASPIRIN ADULT LOW DOSE) 81 MG tablet Take 1 tablet (81 mg total) by mouth daily. (NEEDS TO BE SEEN BEFORE NEXT REFILL) 30 tablet 0   atorvastatin (LIPITOR) 20 MG tablet TAKE 1 TABLET BY MOUTH ONCE DAILY 6 IN THE EVENING . APPOINTMENT REQUIRED FOR FUTURE REFILLS 90 tablet 3   hydrocortisone 1 % lotion Apply 1 application topically 2 (two) times daily. 118 mL 0   meloxicam (MOBIC) 7.5 MG tablet TAKE 1 TABLET BY MOUTH EVERY 12 HOURS AS NEEDED FOR PAIN WITH FOOD 180 tablet 2   metoprolol tartrate (LOPRESSOR) 25 MG tablet Take 1 tablet (25 mg total) by mouth 2 (two) times daily. (NEEDS TO BE SEEN BEFORE NEXT REFILL) 180 tablet 3   potassium chloride SA (KLOR-CON M) 20 MEQ tablet Take 1  tablet (20 mEq total) by mouth daily. 30 tablet 1   SUMAtriptan (IMITREX) 100 MG tablet TAKE 1 TABLET BY MOUTH EVERY 2 HOURS AS NEEDED FOR MIGRAINE. MAY REPEAT IN 2 HOURS IF HEADACHE PERSISTS OR RECURS (NEEDS TO BE SEEN BEFORE NEXT REFILL) 10 tablet 0   triamcinolone cream (KENALOG) 0.1 % Apply 1 Application topically 2 (two) times daily. 30 g 0   Vitamin D, Ergocalciferol, (DRISDOL) 1.25 MG (50000 UNIT) CAPS capsule Take 1 capsule by mouth once a week 12 capsule 0   No current facility-administered medications on file prior to  visit.    Allergies:  No Known Allergies    OBJECTIVE:  Physical Exam  Vitals:   09/01/22 1345  BP: (!) 160/88  Pulse: 89  Weight: 167 lb 9.6 oz (76 kg)  Height: '5\' 4"'$  (1.626 m)   Body mass index is 28.77 kg/m. No results found.   General: well developed, well nourished, very pleasant middle-aged African-American female, seated, in no evident distress Head: head normocephalic and atraumatic.   Neck: supple with no carotid or supraclavicular bruits Cardiovascular: regular rate and rhythm, no murmurs Musculoskeletal: no deformity Skin:  no rash/petichiae Vascular:  Normal pulses all extremities   Neurologic Exam Mental Status: Awake and fully alert. Oriented to place and time. Recent and remote memory intact. Attention span, concentration and fund of knowledge appropriate. Mood and affect appropriate.  Cranial Nerves: Pupils equal, briskly reactive to light. Extraocular movements full without nystagmus. Visual fields full to confrontation. Hearing intact. Facial sensation intact. Face, tongue, palate moves normally and symmetrically.  Motor: Normal bulk and tone. Normal strength in all tested extremity muscles Sensory.: intact to touch , pinprick , position and vibratory sensation.  Coordination: Rapid alternating movements normal in all extremities. Finger-to-nose and heel-to-shin performed accurately bilaterally. Gait and Station: Arises from chair  without difficulty. Stance is normal. Gait demonstrates normal stride length and balance without use of AD.  Reflexes: 1+ and symmetric. Toes downgoing.         ASSESSMENT/PLAN: Krista Finley is a 72 y.o. year old female with recurrent migraine headaches worsening around 10/2021.  Patient of Dr. Rexene Finley. 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  Completed sleep study 04/2022 which did not show evidence of sleep apnea therefore started on amitriptyline for headache prevention.  Continues to experience about 3-4 migraines per week although over the past week has been experiencing daily.    Recommend increasing amitriptyline from 25 mg to 50 mg nightly, if no benefit over the next 3 to 4 weeks advised patient to call office to discuss other treatment options or consider dosage increase.  She would like to avoid injectable medication or Botox if able.  Previously tried/failed: Topiramate and propranolol      Follow up in 3 months or call earlier if needed   CC:  PCP: Sharion Balloon, FNP    I spent 21 minutes of face-to-face and non-face-to-face time with patient.  This included previsit chart review, lab review, study review, order entry, electronic health record documentation, patient education regarding and discussion regarding the above and answered all the questions to patient's satisfaction   Frann Rider, [facility]  [facility] [address] [place], [place] [address]  Phone [phone] Fax [phone] Note: This document was prepared with digital dictation and possible smart phrase technology. Any transcriptional errors that result from this process are unintentional.

## 2022-09-01 NOTE — Patient Instructions (Addendum)
Your Plan:  Continue amitriptyline but will increase to 50 mg nightly for headache prevention  If no benefit after 3-4 weeks, please call office to discuss other treatment options or possible dosage increase    Follow up in 3 months or call earlier if needed     Thank you for coming to see [place] at [facility]. I hope we have been able to provide you high quality care today.  You may receive a patient satisfaction survey over the next few weeks. We would appreciate your feedback and comments so that we may continue to improve ourselves and the health of our patients.

## 2022-09-10 ENCOUNTER — Telehealth: Payer: Self-pay | Admitting: Adult Health

## 2022-09-10 MED ORDER — GABAPENTIN 100 MG PO CAPS: 100.0000 mg | ORAL_CAPSULE | Freq: Every day | ORAL | 3 refills | Status: DC

## 2022-09-10 NOTE — Telephone Encounter (Signed)
Called the pt back. Advised the patient I spoke with Dr Rexene Alberts about her headaches. She states that she would recommend reducing the amitriptyline in half over the next week and then stop.  I asked if the pt is taking mobic daily and she stated she was. I educated that taking that medication daily can cause rebound headaches and she would recommend not taking that daily but only as needed. Pt verbalized understanding. Pt stated she had been monitoring her BP and it had been stable while she was still having headaches. She states she has not been feeling the best this past week and has not monitored this week. Advised she should reach out to the primary care if she hasn't already to make sure she isn't sick/sinus' or head cold that could also contribute. Advised she should also monitor BP over the next week daily and see how that is looking since elevated BP can also affect headaches. Pt agreed to trying gabapentin to see if that would help. Informed her to start the gabapentin once she has stopped the amitriptyline. Pt verbalized understanding. Pt had no questions at this time but was encouraged to call back if questions arise.

## 2022-09-10 NOTE — Telephone Encounter (Signed)
Pt reports that her headaches are worse on the new medication amitriptyline (ELAVIL) 50 MG tablet , she would like a call to discuss.

## 2022-09-10 NOTE — Telephone Encounter (Signed)
Please ask her to taper off amitriptyline by reducing to half a pill daily at bedtime for 1 week then stop.  She can start gabapentin 100 mg at bedtime once she is off of the amitriptyline.  Please verify that she is not taking Mobic daily or nearly daily as it can perpetuate headaches.  Her headaches are not classic for migraines.  Headaches may also correlate with blood pressure elevation, please ask her to monitor blood pressure for the next week, daily or 2 every other day to keep a log.  If agreeable, send in prescription for gabapentin 100 mg nightly, #30 with 3 refills.

## 2022-09-10 NOTE — Addendum Note (Signed)
Addended by: Darleen Crocker on: 09/10/2022 01:19 PM   Modules accepted: Orders

## 2022-09-12 ENCOUNTER — Ambulatory Visit
Admission: EM | Admit: 2022-09-12 | Discharge: 2022-09-12 | Disposition: A | Discharge status: Home / Self Care | Payer: Medicare Other | Attending: Emergency Medicine | Admitting: Emergency Medicine

## 2022-09-12 ENCOUNTER — Emergency Department (HOSPITAL_COMMUNITY)
Admission: EM | Admit: 2022-09-12 | Discharge: 2022-09-12 | Discharge status: Against Medical Advice | Payer: Medicare Other | Source: Home / Self Care

## 2022-09-12 DIAGNOSIS — G43019 Migraine without aura, intractable, without status migrainosus: Secondary | ICD-10-CM

## 2022-09-12 MED ORDER — SUMATRIPTAN SUCCINATE 100 MG PO TABS: ORAL_TABLET | ORAL | 0 refills | Status: DC

## 2022-09-12 NOTE — Discharge Instructions (Addendum)
Rest and drink plenty of fluids Imitrex was prescribed Use OTC medications as needed for symptomatic relief Follow up with PCP if symptoms persists Return or go to the ER if you have any new or worsening symptoms such as fever, chills, nausea, vomiting, chest pain, shortness of breath, cough, vision changes, worsening headache despite treatment, slurred speech, facial asymmetry, weakness in arms or legs, etc..Marland Kitchen

## 2022-09-12 NOTE — ED Triage Notes (Signed)
Pt reports headache in the forehead and behind ears x 4 days. Gabapentin vies no relief.

## 2022-09-12 NOTE — ED Provider Notes (Addendum)
[place]   [id number] 09/12/22 Arrival Time: 1315  [id number]  SUBJECTIVE:  Krista Finley is a 72 y.o. female who presents to the urgent care with a complaint of headache for the past 4 days..  Patient localizes her pain to the forehead.  Describes the pain as constant and throbbing in character.  Patient has tried, without relief.  Denies any aggravating factors reports similar symptoms in the past.  This is not the worst headache of their life.  Patient denies fever, chills, nausea, vomiting, aura, rhinorrhea, watery eyes, chest pain, SOB, abdominal pain, weakness, numbness or tingling, slurred speech.     ROS: As per HPI.  All other pertinent ROS negative.     Past Medical History:  Diagnosis Date   CVA (cerebral vascular accident) ([place])    Gastritis    Headache    Hyperlipidemia    Hypertension    Migraine    Past Surgical History:  Procedure Laterality Date   ELBOW SURGERY Right    No Known Allergies No current facility-administered medications on file prior to encounter.   Current Outpatient Medications on File Prior to Encounter  Medication Sig Dispense Refill   amitriptyline (ELAVIL) 50 MG tablet Take 1 tablet (50 mg total) by mouth at bedtime. 30 tablet 5   amLODipine (NORVASC) 5 MG tablet Take 1 tablet (5 mg total) by mouth daily. (NEEDS TO BE SEEN BEFORE NEXT REFILL) 30 tablet 0   aspirin EC (EQ ASPIRIN ADULT LOW DOSE) 81 MG tablet Take 1 tablet (81 mg total) by mouth daily. (NEEDS TO BE SEEN BEFORE NEXT REFILL) 30 tablet 0   atorvastatin (LIPITOR) 20 MG tablet TAKE 1 TABLET BY MOUTH ONCE DAILY 6 IN THE EVENING . APPOINTMENT REQUIRED FOR FUTURE REFILLS 90 tablet 3   gabapentin (NEURONTIN) 100 MG capsule Take 1 capsule (100 mg total) by mouth at bedtime. 30 capsule 3   hydrocortisone 1 % lotion Apply 1 application topically 2 (two) times daily. 118 mL 0   meloxicam (MOBIC) 7.5 MG tablet TAKE 1 TABLET BY MOUTH EVERY 12 HOURS AS NEEDED FOR PAIN WITH  FOOD 180 tablet 2   metoprolol tartrate (LOPRESSOR) 25 MG tablet Take 1 tablet (25 mg total) by mouth 2 (two) times daily. (NEEDS TO BE SEEN BEFORE NEXT REFILL) 180 tablet 3   potassium chloride SA (KLOR-CON M) 20 MEQ tablet Take 1 tablet (20 mEq total) by mouth daily. 30 tablet 1   triamcinolone cream (KENALOG) 0.1 % Apply 1 Application topically 2 (two) times daily. 30 g 0   Vitamin D, Ergocalciferol, (DRISDOL) 1.25 MG (50000 UNIT) CAPS capsule Take 1 capsule by mouth once a week 12 capsule 0   Social History   Socioeconomic History   Marital status: Single    Spouse name: Not on file   Number of children: Not on file   Years of education: Not on file   Highest education level: High school graduate  Occupational History   Occupation: retired    Fish farm manager: [employer]  Tobacco Use   Smoking status: Never   Smokeless tobacco: Never  Vaping Use   Vaping Use: Never used  Substance and Sexual Activity   Alcohol use: Yes    Comment: 1 drink couple times of year   Drug use: No   Sexual activity: Not Currently  Other Topics Concern   Not on file  Social History Narrative   Stays with her mother and disabled brother   Left handed  Caffeine: tea, maybe 1 or 2 cups/day   Social Determinants of Health   Financial Resource Strain: Low Risk  (12/09/2021)   Overall Financial Resource Strain (CARDIA)    Difficulty of Paying Living Expenses: Not hard at all  Food Insecurity: No Food Insecurity (12/09/2021)   Hunger Vital Sign    Worried About Running Out of Food in the Last Year: Never true    Ran Out of Food in the Last Year: Never true  Transportation Needs: No Transportation Needs (12/09/2021)   PRAPARE - Hydrologist (Medical): No    Lack of Transportation (Non-Medical): No  Physical Activity: Insufficiently Active (12/09/2021)   Exercise Vital Sign    Days of Exercise per Week: 7 days    Minutes of Exercise per Session: 20 min  Stress: No Stress Concern  Present (12/09/2021)   [place]    Feeling of Stress : Not at all  Social Connections: Moderately Integrated (12/09/2021)   Social Connection and Isolation Panel [NHANES]    Frequency of Communication with Friends and Family: More than three times a week    Frequency of Social Gatherings with Friends and Family: Twice a week    Attends Religious Services: More than 4 times per year    Active Member of [employer] or Organizations: Yes    Attends Archivist Meetings: More than 4 times per year    Marital Status: Never married  Intimate Partner Violence: Not At Risk (12/09/2021)   Humiliation, Afraid, Rape, and Kick questionnaire    Fear of Current or Ex-Partner: No    Emotionally Abused: No    Physically Abused: No    Sexually Abused: No   Family History  Problem Relation Age of Onset   Hypertension Mother    Heart disease Mother    Heart disease Father        MI   Parkinson's disease Sister    Parkinson's disease Brother    Stomach cancer Maternal Grandmother    Stomach cancer Maternal Aunt    Colon cancer Neg Hx    Esophageal cancer Neg Hx    Rectal cancer Neg Hx    Breast cancer Neg Hx    Migraines Neg Hx     OBJECTIVE:  Vitals:   09/12/22 1430  BP: (!) 144/82  Pulse: 99  Resp: 16  Temp: 98.9 F (37.2 C)  TempSrc: Oral  SpO2: 96%    General appearance: alert; no distress Eyes: PERRLA; EOMI HENT: normocephalic; atraumatic Neck: supple with FROM Lungs: clear to auscultation bilaterally Heart: regular rate and rhythm.  Radial pulses 2+ symmetrical bilaterally Extremities: no edema; symmetrical with no gross deformities Skin: warm and dry Neurologic: CN 2-12 grossly intact; finger to nose without difficulty; normal gait; strength and sensation intact bilaterally about the upper and lower extremities; negative pronator drift Psychological: alert and cooperative; normal mood and  affect   ASSESSMENT & PLAN:  1. Intractable migraine without aura and without status migrainosus     Meds ordered this encounter  Medications   SUMAtriptan (IMITREX) 100 MG tablet    Sig: TAKE 1 TABLET BY MOUTH EVERY 2 HOURS AS NEEDED FOR MIGRAINE. MAY REPEAT IN 2 HOURS IF HEADACHE    Dispense:  10 tablet    Refill:  0   Discharge instructions   Rest and drink plenty of fluids Imitrex was prescribed Use OTC medications as needed for symptomatic relief Follow up with  PCP if symptoms persists Return or go to the ER if you have any new or worsening symptoms such as fever, chills, nausea, vomiting, chest pain, shortness of breath, cough, vision changes, worsening headache despite treatment, slurred speech, facial asymmetry, weakness in arms or legs, etc...  Reviewed expectations re: course of current medical issues. Questions answered. Outlined signs and symptoms indicating need for more acute intervention. Patient verbalized understanding. After Visit Summary given.    Emerson Monte, FNP 09/12/22 1449    Emerson Monte, FNP 09/12/22 1450

## 2022-09-15 ENCOUNTER — Telehealth: Payer: Self-pay | Admitting: *Deleted

## 2022-09-16 ENCOUNTER — Telehealth: Payer: Self-pay | Admitting: *Deleted

## 2022-09-16 NOTE — Patient Outreach (Signed)
  Care Coordination TOC Note EMMI ED Alert Transition Care Management Unsuccessful Follow-up Telephone Call  Date of discharge and from where:  [facility] on 09/12/22  Attempts:  2nd Attempt  Reason for unsuccessful TCM follow-up call:  Left voice message  Declined to schedule f/u appt with PCP when outreached by [facility] front office staff on 09/15/22.  Chong Sicilian, BSN, RN-BC RN Care Coordinator [place] Direct Dial: [phone] Main #: [phone]

## 2022-09-16 NOTE — Patient Outreach (Signed)
  Care Coordination TOC Note EMMI ED Alert  Transition Care Management Unsuccessful Follow-up Telephone Call  Date of discharge and from where:  [facility] on 09/12/22  Attempts:  1st Attempt  Reason for unsuccessful TCM follow-up call:  Left voice message  Collaborated with [facility] office staff regarding ED follow-up. [facility] front office staff reached out to offer ED follow-up appointment, but patient declined at this time.   Chong Sicilian, BSN, RN-BC RN Care Coordinator [place] Direct Dial: [phone] Main #: [phone]

## 2022-09-17 ENCOUNTER — Encounter: Payer: Self-pay | Admitting: *Deleted

## 2022-09-17 ENCOUNTER — Telehealth: Payer: Self-pay | Admitting: *Deleted

## 2022-09-17 ENCOUNTER — Other Ambulatory Visit: Payer: Self-pay | Admitting: Family

## 2022-09-17 DIAGNOSIS — E785 Hyperlipidemia, unspecified: Secondary | ICD-10-CM

## 2022-09-17 DIAGNOSIS — I10 Essential (primary) hypertension: Secondary | ICD-10-CM

## 2022-09-17 NOTE — Patient Outreach (Signed)
  Care Coordination TOC Note EMMI ED Alert Transition Care Management Follow-up Telephone Call Date of discharge and from where: [facility] ED on 09/12/22 How have you been since you were released from the hospital? "I'm doing good. I don't have a headache anymore." Any questions or concerns? No  Items Reviewed: Did the pt receive and understand the discharge instructions provided? Yes  Medications obtained and verified? Yes  Other? Yes Addressed EMMI flag for not having a PCP f/u appt. Patient stated that she doesn't need an appt at this time.  Any new allergies since your discharge? No  Dietary orders reviewed? No Do you have support at home? Yes   Home Care and Equipment/Supplies: Were home health services ordered? no If so, what is the name of the agency?   Has the agency set up a time to come to the patient's home? not applicable Were any new equipment or medical supplies ordered?  No What is the name of the medical supply agency?  Were you able to get the supplies/equipment? not applicable Do you have any questions related to the use of the equipment or supplies? No  Functional Questionnaire: (I = Independent and D = Dependent) ADLs: I  Bathing/Dressing- I  Meal Prep- I  Eating- I  Maintaining continence- I  Transferring/Ambulation- I  Managing Meds- I  Follow up appointments reviewed:  PCP Hospital f/u appt confirmed? No  Patient declined [facility] [facility] f/u appt confirmed?  Not indicated   Are transportation arrangements needed? No  If their condition worsens, is the pt aware to call PCP or go to the Emergency Dept.? Yes Was the patient provided with contact information for the PCP's office or ED? Yes Was to pt encouraged to call back with questions or concerns? Yes  SDOH assessments and interventions completed:   Yes SDOH Interventions Today    Flowsheet Row Most Recent Value  SDOH Interventions   Transportation Interventions Intervention Not Indicated   Financial Strain Interventions Intervention Not Indicated       Care Coordination Interventions:  PCP follow up appointment requested. Collaborated with [facility] front office staff to offer a follow-up appt but patient declined at this time.   Encounter Outcome:  Pt. Visit Completed    Chong Sicilian, BSN, RN-BC RN Care Coordinator [place] Direct Dial: [phone] Main #: [phone]

## 2022-10-25 ENCOUNTER — Other Ambulatory Visit: Payer: Self-pay | Admitting: Family

## 2022-11-25 ENCOUNTER — Other Ambulatory Visit: Payer: Self-pay | Admitting: Family

## 2022-11-27 ENCOUNTER — Telehealth: Payer: Self-pay | Admitting: Family

## 2022-11-27 NOTE — Telephone Encounter (Signed)
Contacted Krista Finley to schedule their annual wellness visit. Appointment made for 12/11/2022.  Thank you,  Krista Finley,  AMB Clinical Support [facility] Program Direct Dial ??[phone]

## 2022-12-08 ENCOUNTER — Ambulatory Visit: Payer: Medicare Other | Admitting: Adult Health

## 2022-12-09 ENCOUNTER — Other Ambulatory Visit: Payer: Self-pay | Admitting: Family

## 2022-12-09 DIAGNOSIS — I10 Essential (primary) hypertension: Secondary | ICD-10-CM

## 2022-12-11 ENCOUNTER — Ambulatory Visit (INDEPENDENT_AMBULATORY_CARE_PROVIDER_SITE_OTHER): Payer: Medicare Other

## 2022-12-11 VITALS — Ht 64.0 in | Wt 152.0 lb

## 2022-12-11 DIAGNOSIS — Z1231 Encounter for screening mammogram for malignant neoplasm of breast: Secondary | ICD-10-CM

## 2022-12-11 DIAGNOSIS — Z780 Asymptomatic menopausal state: Secondary | ICD-10-CM | POA: Diagnosis not present

## 2022-12-11 DIAGNOSIS — Z0000 Encounter for general adult medical examination without abnormal findings: Secondary | ICD-10-CM | POA: Diagnosis not present

## 2022-12-11 NOTE — Progress Notes (Signed)
Subjective:   Krista Finley is a 72 y.o. female who presents for Medicare Annual (Subsequent) preventive examination. I connected with  GELENE RECKTENWALD on 12/11/22 by a audio enabled telemedicine application and verified that I am speaking with the correct person using two identifiers.  Patient Location: Home  Provider Location: Home Office  I discussed the limitations of evaluation and management by telemedicine. The patient expressed understanding and agreed to proceed.  Review of Systems     Cardiac Risk Factors include: advanced age (>42men, >41 women);dyslipidemia;hypertension     Objective:    Today's Vitals   12/11/22 0917  Weight: 152 lb (68.9 kg)  Height:  (1.626 m)   Body mass index is 26.09 kg/m.     12/11/2022    9:20 AM 12/09/2021    2:27 PM 10/13/2021   11:21 AM 11/27/2020    1:33 PM 05/06/2019    9:28 AM 05/01/2019   12:54 PM 12/22/2018   11:30 AM  Advanced Directives  Does Patient Have a Medical Advance Directive? No No No No No No No  Would patient like information on creating a medical advance directive? No - Patient declined No - Patient declined No - Patient declined No - Patient declined No - Patient declined No - Patient declined No - Patient declined    Current Medications (verified) Outpatient Encounter Medications as of 12/11/2022  Medication Sig   amitriptyline (ELAVIL) 50 MG tablet Take 1 tablet (50 mg total) by mouth at bedtime.   amLODipine (NORVASC) 5 MG tablet Take 1 tablet (5 mg total) by mouth daily.   aspirin EC (ASPIRIN LOW DOSE) 81 MG tablet Take 1 tablet (81 mg total) by mouth daily.   atorvastatin (LIPITOR) 20 MG tablet TAKE 1 TABLET BY MOUTH ONCE DAILY AT 6 IN THE EVENING   gabapentin (NEURONTIN) 100 MG capsule Take 1 capsule (100 mg total) by mouth at bedtime.   hydrocortisone 1 % lotion Apply 1 application topically 2 (two) times daily.   meloxicam (MOBIC) 7.5 MG tablet TAKE 1 TABLET BY MOUTH EVERY 12 HOURS AS NEEDED FOR  PAIN WITH FOOD   metoprolol tartrate (LOPRESSOR) 25 MG tablet TAKE 1 TABLET BY MOUTH TWICE DAILY . APPOINTMENT REQUIRED FOR FUTURE REFILLS   potassium chloride SA (KLOR-CON M20) 20 MEQ tablet Take 1 tablet by mouth once daily   SUMAtriptan (IMITREX) 100 MG tablet TAKE 1 TABLET BY MOUTH EVERY 2 HOURS AS NEEDED FOR MIGRAINE. MAY REPEAT IN 2 HOURS IF HEADACHE   triamcinolone cream (KENALOG) 0.1 % Apply 1 Application topically 2 (two) times daily.   Vitamin D, Ergocalciferol, (DRISDOL) 1.25 MG (50000 UNIT) CAPS capsule Take 1 capsule by mouth once a week   No facility-administered encounter medications on file as of 12/11/2022.    Allergies (verified) Patient has no known allergies.   History: Past Medical History:  Diagnosis Date   CVA (cerebral vascular accident)    Gastritis    Headache    Hyperlipidemia    Hypertension    Migraine    Past Surgical History:  Procedure Laterality Date   ELBOW SURGERY Right    Family History  Problem Relation Age of Onset   Hypertension Mother    Heart disease Mother    Heart disease Father        MI   Parkinson's disease Sister    Parkinson's disease Brother    Stomach cancer Maternal Grandmother    Stomach cancer Maternal Aunt    Colon  cancer Neg Hx    Esophageal cancer Neg Hx    Rectal cancer Neg Hx    Breast cancer Neg Hx    Migraines Neg Hx    Social History   Socioeconomic History   Marital status: Single    Spouse name: Not on file   Number of children: Not on file   Years of education: Not on file   Highest education level: High school graduate  Occupational History   Occupation: retired    Associate Professor: [employer]  Tobacco Use   Smoking status: Never   Smokeless tobacco: Never  Vaping Use   Vaping Use: Never used  Substance and Sexual Activity   Alcohol use: Yes    Comment: 1 drink couple times of year   Drug use: No   Sexual activity: Not Currently  Other Topics Concern   Not on file  Social History Narrative    Stays with her mother and disabled brother   Left handed   Caffeine: tea, maybe 1 or 2 cups/day   Social Determinants of Health   Financial Resource Strain: Low Risk  (12/11/2022)   Overall Financial Resource Strain (CARDIA)    Difficulty of Paying Living Expenses: Not hard at all  Food Insecurity: No Food Insecurity (12/11/2022)   Hunger Vital Sign    Worried About Running Out of Food in the Last Year: Never true    Ran Out of Food in the Last Year: Never true  Transportation Needs: No Transportation Needs (12/11/2022)   PRAPARE - Administrator, Civil Service (Medical): No    Lack of Transportation (Non-Medical): No  Physical Activity: Insufficiently Active (12/11/2022)   Exercise Vital Sign    Days of Exercise per Week: 3 days    Minutes of Exercise per Session: 30 min  Stress: No Stress Concern Present (12/11/2022)   [employer] of Occupational Health - Occupational Stress Questionnaire    Feeling of Stress : Not at all  Social Connections: Moderately Isolated (12/11/2022)   Social Connection and Isolation Panel [NHANES]    Frequency of Communication with Friends and Family: More than three times a week    Frequency of Social Gatherings with Friends and Family: More than three times a week    Attends Religious Services: More than 4 times per year    Active Member of [employer] or Organizations: No    Attends Engineer, structural: Never    Marital Status: Never married    Tobacco Counseling Counseling given: Not Answered   Clinical Intake:  Pre-visit preparation completed: Yes  Pain : No/denies pain     Nutritional Risks: None Diabetes: No  How often do you need to have someone help you when you read instructions, pamphlets, or other written materials from your doctor or pharmacy?: 1 - Never  Diabetic?no   Interpreter Needed?: No  Information entered by :: Renie Ora, LPN   Activities of Daily Living    12/11/2022    9:20 AM  In your  present state of health, do you have any difficulty performing the following activities:  Hearing? 0  Vision? 0  Difficulty concentrating or making decisions? 0  Walking or climbing stairs? 0  Dressing or bathing? 0  Doing errands, shopping? 0  Preparing Food and eating ? N  Using the Toilet? N  In the past six months, have you accidently leaked urine? N  Do you have problems with loss of bowel control? N  Managing your Medications? N  Managing your Finances? N  Housekeeping or managing your Housekeeping? N    Patient Care Team: Junie Spencer, FNP as PCP - General (Family Medicine) Beryle Beams, MD as Consulting Physician (Neurology) Delora Fuel, [place] (Optometry) Iva Boop, MD as Consulting Physician (Gastroenterology)  Indicate any recent Medical Services you may have received from other than [facility] providers in the past year (date may be approximate).     Assessment:   This is a routine wellness examination for D'Hanis.  Hearing/Vision screen Vision Screening - Comments:: Wears rx glasses - up to date with routine eye exams with  Dr.Johnson   Dietary issues and exercise activities discussed: Current Exercise Habits: Home exercise routine, Type of exercise: walking, Time (Minutes): 30, Frequency (Times/Week): 3, Weekly Exercise (Minutes/Week): 90, Intensity: Mild, Exercise limited by: None identified   Goals Addressed             This Visit's Progress    DIET - INCREASE WATER INTAKE   On track    Recommend 6-8 glasses per day       Depression Screen    12/11/2022    9:19 AM 08/28/2022    9:33 AM 03/09/2022   10:05 AM 01/26/2022   11:08 AM 12/09/2021    2:24 PM 10/14/2021    2:09 PM 05/30/2021    2:50 PM  PHQ 2/9 Scores  PHQ - 2 Score 0 0 0 3 0 0 0  PHQ- 9 Score  0 0 6 0 0 0    Fall Risk    12/11/2022    9:18 AM 08/28/2022    9:33 AM 03/09/2022   10:05 AM 12/09/2021    2:22 PM 10/14/2021    2:09 PM  Fall Risk   Falls in the past year? 0 0 0 0 0   Number falls in past yr: 0   0   Injury with Fall? 0   0   Risk for fall due to : No Fall Risks   No Fall Risks   Follow up Falls prevention discussed   Falls prevention discussed     FALL RISK PREVENTION PERTAINING TO THE HOME:  Any stairs in or around the home? No  If so, are there any without handrails? No  Home free of loose throw rugs in walkways, pet beds, electrical cords, etc? Yes  Adequate lighting in your home to reduce risk of falls? Yes   ASSISTIVE DEVICES UTILIZED TO PREVENT FALLS:  Life alert? No  Use of a cane, walker or w/c? No  Grab bars in the bathroom? No  Shower chair or bench in shower? No  Elevated toilet seat or a handicapped toilet? No       08/28/2022    9:39 AM 08/28/2022    9:38 AM  MMSE - Mini Mental State Exam  Orientation to time  4  Orientation to Place  5  Registration  3  Attention/ Calculation  5  Recall  3  Language- name 2 objects  2  Language- repeat  1  Language- follow 3 step command  3  Language- read & follow direction  1  Write a sentence  1  Copy design 1         12/11/2022    9:20 AM 12/09/2021    2:25 PM 11/27/2020    1:35 PM  6CIT Screen  What Year? 0 points 0 points 0 points  What month? 0 points 0 points 0 points  What time? 0 points 0 points  0 points  Count back from 20 0 points 0 points 0 points  Months in reverse 0 points 2 points 0 points  Repeat phrase 0 points 4 points 0 points  Total Score 0 points 6 points 0 points    Immunizations Immunization History  Administered Date(s) Administered   PFIZER(Purple Top)SARS-COV-2 Vaccination 11/16/2019, 12/09/2019, 08/23/2020   PPD Test 04/11/2021   Tdap 09/09/2017   Zoster, Live 04/20/2013    TDAP status: Up to date  Flu Vaccine status: Declined, Education has been provided regarding the importance of this vaccine but patient still declined. Advised may receive this vaccine at local pharmacy or Health Dept. Aware to provide a copy of the vaccination record if  obtained from local pharmacy or Health Dept. Verbalized acceptance and understanding.  Pneumococcal vaccine status: Due, Education has been provided regarding the importance of this vaccine. Advised may receive this vaccine at local pharmacy or Health Dept. Aware to provide a copy of the vaccination record if obtained from local pharmacy or Health Dept. Verbalized acceptance and understanding.  Covid-19 vaccine status: Completed vaccines  Qualifies for Shingles Vaccine? Yes   Zostavax completed No   Shingrix Completed?: No.    Education has been provided regarding the importance of this vaccine. Patient has been advised to call insurance company to determine out of pocket expense if they have not yet received this vaccine. Advised may also receive vaccine at local pharmacy or Health Dept. Verbalized acceptance and understanding.  Screening Tests Health Maintenance  Topic Date Due   Zoster Vaccines- Shingrix (1 of 2) Never done   COVID-19 Vaccine (4 - 2023-24 season) 04/24/2022   DEXA SCAN  11/06/2022   Pneumonia Vaccine 76+ Years old (1 of 1 - PCV) 08/29/2023 (Originally 06/24/2018)   MAMMOGRAM  12/25/2022   INFLUENZA VACCINE  03/25/2023   Medicare Annual Wellness (AWV)  12/11/2023   DTaP/Tdap/Td (2 - Td or Tdap) 09/10/2027   COLONOSCOPY (Pts 45-29yrs Insurance coverage will need to be confirmed)  05/14/2029   Hepatitis C Screening  Completed   HPV VACCINES  Aged Out    Health Maintenance  Health Maintenance Due  Topic Date Due   Zoster Vaccines- Shingrix (1 of 2) Never done   COVID-19 Vaccine (4 - 2023-24 season) 04/24/2022   DEXA SCAN  11/06/2022    Colorectal cancer screening: Type of screening: Colonoscopy. Completed 05/15/2019. Repeat every 10 years  Mammogram status: Completed 12/24/2021. Repeat every year  Bone Density status: Ordered 12/11/2022. Pt provided with contact info and advised to call to schedule appt.  Lung Cancer Screening: (Low Dose CT Chest recommended if  Age 61-80 years, 30 pack-year currently smoking OR have quit w/in 15years.) does not qualify.   Lung Cancer Screening Referral: n/a  Additional Screening:  Hepatitis C Screening: does not qualify;   Vision Screening: Recommended annual ophthalmology exams for early detection of glaucoma and other disorders of the eye. Is the patient up to date with their annual eye exam?  Yes  Who is the provider or what is the name of the office in which the patient attends annual eye exams? Dr.Johnson  If pt is not established with a provider, would they like to be referred to a provider to establish care? No .   Dental Screening: Recommended annual dental exams for proper oral hygiene  Community Resource Referral / Chronic Care Management: CRR required this visit?  No   CCM required this visit?  No      Plan:  I have personally reviewed and noted the following in the patient's chart:   Medical and social history Use of alcohol, tobacco or illicit drugs  Current medications and supplements including opioid prescriptions. Patient is not currently taking opioid prescriptions. Functional ability and status Nutritional status Physical activity Advanced directives List of other physicians Hospitalizations, surgeries, and ER visits in previous 12 months Vitals Screenings to include cognitive, depression, and falls Referrals and appointments  In addition, I have reviewed and discussed with patient certain preventive protocols, quality metrics, and best practice recommendations. A written personalized care plan for preventive services as well as general preventive health recommendations were provided to patient.     Lorrene Reid, LPN   1/61/0960   Nurse Notes: Due Pneumonia Vaccine

## 2022-12-11 NOTE — Patient Instructions (Signed)
Krista Finley , Thank you for taking time to come for your Medicare Wellness Visit. I appreciate your ongoing commitment to your health goals. Please review the following plan we discussed and let me know if I can assist you in the future.   These are the goals we discussed:  Goals      DIET - INCREASE WATER INTAKE     Recommend 6-8 glasses per day     Patient Stated     She would like to lose a little weight        This is a list of the screening recommended for you and due dates:  Health Maintenance  Topic Date Due   Zoster (Shingles) Vaccine (1 of 2) Never done   COVID-19 Vaccine (4 - 2023-24 season) 04/24/2022   DEXA scan (bone density measurement)  11/06/2022   Pneumonia Vaccine (1 of 1 - PCV) 08/29/2023*   Mammogram  12/25/2022   Flu Shot  03/25/2023   Medicare Annual Wellness Visit  12/11/2023   DTaP/Tdap/Td vaccine (2 - Td or Tdap) 09/10/2027   Colon Cancer Screening  05/14/2029   Hepatitis C Screening: USPSTF Recommendation to screen - Ages 18-79 yo.  Completed   HPV Vaccine  Aged Out  *Topic was postponed. The date shown is not the original due date.    Advanced directives: Advance directive discussed with you today. I have provided a copy for you to complete at home and have notarized. Once this is complete please bring a copy in to our office so we can scan it into your chart.   Conditions/risks identified: Aim for 30 minutes of exercise or brisk walking, 6-8 glasses of water, and 5 servings of fruits and vegetables each day.   Next appointment: Follow up in one year for your annual wellness visit    Preventive Care 65 Years and Older, Female Preventive care refers to lifestyle choices and visits with your health care provider that can promote health and wellness. What does preventive care include? A yearly physical exam. This is also called an annual well check. Dental exams once or twice a year. Routine eye exams. Ask your health care provider how often you  should have your eyes checked. Personal lifestyle choices, including: Daily care of your teeth and gums. Regular physical activity. Eating a healthy diet. Avoiding tobacco and drug use. Limiting alcohol use. Practicing safe sex. Taking low-dose aspirin every day. Taking vitamin and mineral supplements as recommended by your health care provider. What happens during an annual well check? The services and screenings done by your health care provider during your annual well check will depend on your age, overall health, lifestyle risk factors, and family history of disease. Counseling  Your health care provider may ask you questions about your: Alcohol use. Tobacco use. Drug use. Emotional well-being. Home and relationship well-being. Sexual activity. Eating habits. History of falls. Memory and ability to understand (cognition). Work and work Astronomer. Reproductive health. Screening  You may have the following tests or measurements: Height, weight, and BMI. Blood pressure. Lipid and cholesterol levels. These may be checked every 5 years, or more frequently if you are over 45 years old. Skin check. Lung cancer screening. You may have this screening every year starting at age 93 if you have a 30-pack-year history of smoking and currently smoke or have quit within the past 15 years. Fecal occult blood test (FOBT) of the stool. You may have this test every year starting at age 63. Flexible sigmoidoscopy  or colonoscopy. You may have a sigmoidoscopy every 5 years or a colonoscopy every 10 years starting at age 62. Hepatitis C blood test. Hepatitis B blood test. Sexually transmitted disease (STD) testing. Diabetes screening. This is done by checking your blood sugar (glucose) after you have not eaten for a while (fasting). You may have this done every 1-3 years. Bone density scan. This is done to screen for osteoporosis. You may have this done starting at age 36. Mammogram. This may  be done every 1-2 years. Talk to your health care provider about how often you should have regular mammograms. Talk with your health care provider about your test results, treatment options, and if necessary, the need for more tests. Vaccines  Your health care provider may recommend certain vaccines, such as: Influenza vaccine. This is recommended every year. Tetanus, diphtheria, and acellular pertussis (Tdap, Td) vaccine. You may need a Td booster every 10 years. Zoster vaccine. You may need this after age 41. Pneumococcal 13-valent conjugate (PCV13) vaccine. One dose is recommended after age 62. Pneumococcal polysaccharide (PPSV23) vaccine. One dose is recommended after age 22. Talk to your health care provider about which screenings and vaccines you need and how often you need them. This information is not intended to replace advice given to you by your health care provider. Make sure you discuss any questions you have with your health care provider. Document Released: 09/06/2015 Document Revised: 04/29/2016 Document Reviewed: 06/11/2015 Elsevier Interactive Patient Education  2017 [place] Prevention in the Home Falls can cause injuries. They can happen to people of all ages. There are many things you can do to make your home safe and to help prevent falls. What can I do on the outside of my home? Regularly fix the edges of walkways and driveways and fix any cracks. Remove anything that might make you trip as you walk through a door, such as a raised step or threshold. Trim any bushes or trees on the path to your home. Use bright outdoor lighting. Clear any walking paths of anything that might make someone trip, such as rocks or tools. Regularly check to see if handrails are loose or broken. Make sure that both sides of any steps have handrails. Any raised decks and porches should have guardrails on the edges. Have any leaves, snow, or ice cleared regularly. Use sand or salt  on walking paths during winter. Clean up any spills in your garage right away. This includes oil or grease spills. What can I do in the bathroom? Use night lights. Install grab bars by the toilet and in the tub and shower. Do not use towel bars as grab bars. Use non-skid mats or decals in the tub or shower. If you need to sit down in the shower, use a plastic, non-slip stool. Keep the floor dry. Clean up any water that spills on the floor as soon as it happens. Remove soap buildup in the tub or shower regularly. Attach bath mats securely with double-sided non-slip rug tape. Do not have throw rugs and other things on the floor that can make you trip. What can I do in the bedroom? Use night lights. Make sure that you have a light by your bed that is easy to reach. Do not use any sheets or blankets that are too big for your bed. They should not hang down onto the floor. Have a firm chair that has side arms. You can use this for support while you get dressed. Do  not have throw rugs and other things on the floor that can make you trip. What can I do in the kitchen? Clean up any spills right away. Avoid walking on wet floors. Keep items that you use a lot in easy-to-reach places. If you need to reach something above you, use a strong step stool that has a grab bar. Keep electrical cords out of the way. Do not use floor polish or wax that makes floors slippery. If you must use wax, use non-skid floor wax. Do not have throw rugs and other things on the floor that can make you trip. What can I do with my stairs? Do not leave any items on the stairs. Make sure that there are handrails on both sides of the stairs and use them. Fix handrails that are broken or loose. Make sure that handrails are as long as the stairways. Check any carpeting to make sure that it is firmly attached to the stairs. Fix any carpet that is loose or worn. Avoid having throw rugs at the top or bottom of the stairs. If you  do have throw rugs, attach them to the floor with carpet tape. Make sure that you have a light switch at the top of the stairs and the bottom of the stairs. If you do not have them, ask someone to add them for you. What else can I do to help prevent falls? Wear shoes that: Do not have high heels. Have rubber bottoms. Are comfortable and fit you well. Are closed at the toe. Do not wear sandals. If you use a stepladder: Make sure that it is fully opened. Do not climb a closed stepladder. Make sure that both sides of the stepladder are locked into place. Ask someone to hold it for you, if possible. Clearly mark and make sure that you can see: Any grab bars or handrails. First and last steps. Where the edge of each step is. Use tools that help you move around (mobility aids) if they are needed. These include: Canes. Walkers. Scooters. Crutches. Turn on the lights when you go into a dark area. Replace any light bulbs as soon as they burn out. Set up your furniture so you have a clear path. Avoid moving your furniture around. If any of your floors are uneven, fix them. If there are any pets around you, be aware of where they are. Review your medicines with your doctor. Some medicines can make you feel dizzy. This can increase your chance of falling. Ask your doctor what other things that you can do to help prevent falls. This information is not intended to replace advice given to you by your health care provider. Make sure you discuss any questions you have with your health care provider. Document Released: 06/06/2009 Document Revised: 01/16/2016 Document Reviewed: 09/14/2014 Elsevier Interactive Patient Education  2017 [employer].

## 2022-12-21 ENCOUNTER — Other Ambulatory Visit: Payer: Self-pay | Admitting: Family

## 2022-12-21 ENCOUNTER — Ambulatory Visit (INDEPENDENT_AMBULATORY_CARE_PROVIDER_SITE_OTHER): Payer: Medicare Other

## 2022-12-21 DIAGNOSIS — Z780 Asymptomatic menopausal state: Secondary | ICD-10-CM

## 2022-12-21 DIAGNOSIS — M8580 Other specified disorders of bone density and structure, unspecified site: Secondary | ICD-10-CM

## 2022-12-29 ENCOUNTER — Ambulatory Visit: Payer: Medicare Other | Admitting: Neurology

## 2022-12-29 ENCOUNTER — Encounter: Payer: Self-pay | Admitting: Neurology

## 2022-12-29 VITALS — BP 140/86 | HR 94 | Ht 64.0 in | Wt 163.2 lb

## 2022-12-29 DIAGNOSIS — Z9189 Other specified personal risk factors, not elsewhere classified: Secondary | ICD-10-CM | POA: Diagnosis not present

## 2022-12-29 DIAGNOSIS — R519 Headache, unspecified: Secondary | ICD-10-CM | POA: Diagnosis not present

## 2022-12-29 DIAGNOSIS — R030 Elevated blood-pressure reading, without diagnosis of hypertension: Secondary | ICD-10-CM | POA: Diagnosis not present

## 2022-12-29 MED ORDER — AMITRIPTYLINE HCL 50 MG PO TABS: 75.0000 mg | ORAL_TABLET | Freq: Every day | ORAL | 3 refills | Status: DC

## 2022-12-29 NOTE — Patient Instructions (Addendum)
Please stop the gabapentin, which we started in January 2024 for your headaches.  As you indicate that it does not help, we will stop it at this time.  You were supposed to come off the amitriptyline but indicate that you still take 50 mg each night, you can slightly increase it to 1-1/2 pills each night at this point, for total of 75 mg daily.  I have changed your prescription in that regard.  Please follow-up to see Shanda Bumps in 4 to 6 months but I do need for you to work on your lifestyle and hydration a little bit better, also blood pressure control.  Please follow-up with primary care regarding your blood pressure management.  Your blood pressure was elevated today as well.  Please try to hydrate well with water, as dehydration and suboptimal hydration with water can contribute to your headaches.  Please try to drink 3-4 bottles of water per day, 16.9 ounce size each.  Please avoid drinking caffeine and at least limit to 1 can of soda per day rather than 2 cans of soda per day.

## 2022-12-29 NOTE — Progress Notes (Signed)
Subjective:    Patient ID: Krista Finley is a 72 y.o. female.  HPI    Interim history:    Krista Finley is a 72 year old right-handed woman with an underlying medical history of hypertension, hyperlipidemia, vitamin D deficiency, history of stroke, and overweight state, who presents for follow-up consultation of her recurrent headaches.  The patient is unaccompanied today.  She was last seen in this clinic by Ihor Austin, NP on 09/01/2022, at which time she reported ongoing headaches, often waking up with a headache.  With her previous neurologist, Dr. Gerilyn Pilgrim she had tried propranolol, sumatriptan and topiramate.  She denied any photophobia, phonophobia, nausea or vomiting.  She was advised to increase amitriptyline from 25 mg to 50 mg nightly.  She had undergone a sleep study through our sleep lab on 04/28/2022 which did not show any significant sleep disordered breathing.  I first met her on 03/16/2022 at the request of her primary care nurse practitioner.  She was advised to start on amitriptyline for headache prevention.  Today, 12/29/2022: She reports still having recurrent headaches, no severe throbbing, no nausea or vomiting reported, no recent visual changes, has an eye exam yearly.  She takes amitriptyline around 9 PM, she reports taking it daily.  She is not sure why she takes gabapentin.  We started the gabapentin in January 2024 after she had called and that the amitriptyline was not helping.  She was advised to taper off the amitriptyline and start gabapentin but she is still taking the amitriptyline and also the gabapentin, does not believe the addition of gabapentin was helpful.  She is okay stopping the gabapentin and trying an increased dose of amitriptyline.  She has had elevated blood pressure values at home, systolic is in the 140s, she believes the diastolic numbers in the 80s.  She has a follow-up appointment with primary care coming up in the next couple of months.  She admits to  not drinking any water.  She reports that she drinks 2 cans of soda per day and also takes her medications with her soda, typically no water.  She does report difficulty remembering things.  The patient's allergies, current medications, family history, past medical history, past social history, past surgical history and problem list were reviewed and updated as appropriate.   Previously:   03/16/22: (She) reports recurrent headaches for the past 4 months, she has nearly daily headaches, describes these in the frontal area primarily, throbbing in nature but not associated with nausea or vomiting or photophobia.  She often wakes up with a headache.  She is not sure if she snores and she has not had a partner in over 20 years.  She lives with her elderly mom and her brother and is a caretaker for both.  She is retired from working in [employer] for 20+ years.  Of note, she does not drink much in the way of water, she likes to drink lemonade and some soda, occasional tea, she is a non-smoker and does not drink alcohol.  She denies any stress, other than being a caretaker for her mom and brother.  She feels that she has been more forgetful.  She got worse with her forgetfulness when she tried topiramate which has since then been stopped.  She does not take any over-the-counter medication for her headaches.  She has nocturia about once or twice per average night.  She goes to bed between 9 and 10 and rise time is around 8.  Her Epworth sleepiness  score is 2 out of 24.  She is not aware of any family history of sleep apnea.  She had a eye examination centimeters ago eyeglasses.  He has no family history of migraines, she has 1 daughter, no history of migraines.   I reviewed your office note from 01/26/2022.  She has been followed by Dr. Beryle Beams and has tried for migraine prevention propranolol as well as Topamax.  She had side effects with the Topamax including cognitive side effects.  Neurologic office notes are  not available for my review today.  She has a remote history of stroke with left basal ganglia infarct and had seen Dr. Marvel Plan in the past.    She has tried sumatriptan for acute management of headaches which was not effective.   She had a recent brain MRI without contrast through [facility] on 11/11/2021 with indication of hemiparesis, unspecified hemiparesis etiology, unspecified laterality.  New daily persistent headache.  Patient reports headaches for 6 weeks.  I reviewed the results: IMPRESSION: No evidence of acute intracranial abnormality.   Known chronic small-vessel infarct within the left corona radiata and basal ganglia.   Mild-to-moderate chronic small vessel ischemic changes elsewhere within the cerebral white matter, progressed from the prior brain MRI of 04/25/2015.   She had a prior brain MRI without contrast through [facility] on 04/25/2015 and I reviewed the results: IMPRESSION: Acute nonhemorrhagic infarct extends from the posterior left lenticular nucleus to posterior left corona radiata.   Very mild small vessel disease type changes.   She had a head CT without contrast through [facility] ED on 10/13/2021 with indication of worsening headache, memory issues and I reviewed the results: IMPRESSION: Stable chronic white matter microvascular ischemic changes.   No interval change or acute process by noncontrast CT.   She had a head CT without contrast through [facility] ED on 03/16/2017 with indication of chest pain and headaches following recent dental procedure.  I reviewed the results: IMPRESSION: No acute abnormality noted.   She had a remote CT angiogram of the head and neck with and without contrast on 04/25/2015 and I reviewed the results:IMPRESSION: 1. No emergent large vessel occlusion. Preliminary report of these findings discussed in person with Dr. Cyril Mourning at 1053 hours. 2. No significant stenosis. ICA siphon calcified plaque, with little to no  atherosclerosis elsewhere. 3. CT head without contrast reported separately.     Her Past Medical History Is Significant For: Past Medical History:  Diagnosis Date   CVA (cerebral vascular accident) (HCC)    Gastritis    Headache    Hyperlipidemia    Hypertension    Migraine     Her Past Surgical History Is Significant For: Past Surgical History:  Procedure Laterality Date   ELBOW SURGERY Right     Her Family History Is Significant For: Family History  Problem Relation Age of Onset   Hypertension Mother    Heart disease Mother    Heart disease Father        MI   Parkinson's disease Sister    Parkinson's disease Brother    Stomach cancer Maternal Grandmother    Stomach cancer Maternal Aunt    Colon cancer Neg Hx    Esophageal cancer Neg Hx    Rectal cancer Neg Hx    Breast cancer Neg Hx    Migraines Neg Hx     Her Social History Is Significant For: Social History   Socioeconomic History   Marital status: Single  Spouse name: Not on file   Number of children: Not on file   Years of education: Not on file   Highest education level: High school graduate  Occupational History   Occupation: retired    Associate Professor: [employer]  Tobacco Use   Smoking status: Never   Smokeless tobacco: Never  Vaping Use   Vaping Use: Never used  Substance and Sexual Activity   Alcohol use: Yes    Comment: 1 drink couple times of year   Drug use: No   Sexual activity: Not Currently  Other Topics Concern   Not on file  Social History Narrative   Stays with her mother and disabled brother   Left handed   Caffeine: tea, maybe 1 or 2 cups/day   Social Determinants of Health   Financial Resource Strain: Low Risk  (12/11/2022)   Overall Financial Resource Strain (CARDIA)    Difficulty of Paying Living Expenses: Not hard at all  Food Insecurity: No Food Insecurity (12/11/2022)   Hunger Vital Sign    Worried About Running Out of Food in the Last Year: Never true    Ran Out of Food  in the Last Year: Never true  Transportation Needs: No Transportation Needs (12/11/2022)   PRAPARE - Administrator, Civil Service (Medical): No    Lack of Transportation (Non-Medical): No  Physical Activity: Insufficiently Active (12/11/2022)   Exercise Vital Sign    Days of Exercise per Week: 3 days    Minutes of Exercise per Session: 30 min  Stress: No Stress Concern Present (12/11/2022)   [employer] of Occupational Health - Occupational Stress Questionnaire    Feeling of Stress : Not at all  Social Connections: Moderately Isolated (12/11/2022)   Social Connection and Isolation Panel [NHANES]    Frequency of Communication with Friends and Family: More than three times a week    Frequency of Social Gatherings with Friends and Family: More than three times a week    Attends Religious Services: More than 4 times per year    Active Member of [employer] or Organizations: No    Attends Banker Meetings: Never    Marital Status: Never married    Her Allergies Are:  No Known Allergies:   Her Current Medications Are:  Outpatient Encounter Medications as of 12/29/2022  Medication Sig   amitriptyline (ELAVIL) 50 MG tablet Take 1 tablet (50 mg total) by mouth at bedtime.   amLODipine (NORVASC) 5 MG tablet Take 1 tablet (5 mg total) by mouth daily.   aspirin EC (ASPIRIN LOW DOSE) 81 MG tablet Take 1 tablet (81 mg total) by mouth daily.   atorvastatin (LIPITOR) 20 MG tablet TAKE 1 TABLET BY MOUTH ONCE DAILY AT 6 IN THE EVENING   gabapentin (NEURONTIN) 100 MG capsule Take 1 capsule (100 mg total) by mouth at bedtime.   hydrocortisone 1 % lotion Apply 1 application topically 2 (two) times daily.   meloxicam (MOBIC) 7.5 MG tablet TAKE 1 TABLET BY MOUTH EVERY 12 HOURS AS NEEDED FOR PAIN WITH FOOD   metoprolol tartrate (LOPRESSOR) 25 MG tablet TAKE 1 TABLET BY MOUTH TWICE DAILY . APPOINTMENT REQUIRED FOR FUTURE REFILLS   potassium chloride SA (KLOR-CON M20) 20 MEQ tablet Take  1 tablet by mouth once daily   SUMAtriptan (IMITREX) 100 MG tablet TAKE 1 TABLET BY MOUTH EVERY 2 HOURS AS NEEDED FOR MIGRAINE. MAY REPEAT IN 2 HOURS IF HEADACHE   triamcinolone cream (KENALOG) 0.1 % Apply 1  Application topically 2 (two) times daily.   Vitamin D, Ergocalciferol, (DRISDOL) 1.25 MG (50000 UNIT) CAPS capsule Take 1 capsule by mouth once a week   No facility-administered encounter medications on file as of 12/29/2022.  :  Review of Systems:  Out of a complete 14 point review of systems, all are reviewed and negative with the exception of these symptoms as listed below:  Review of Systems  Neurological:        3 month follow up on headaches. Still having  headaches every day.  Has one today.  Level 5 today.  Pain frontal bi-temporal.  No n/v, or phono-photo sensitivity.     Objective:  Neurological Exam  Physical Exam Physical Examination:   Vitals:   12/29/22 1034 12/29/22 1044  BP: (!) 159/84 (!) 140/86  Pulse: 96 94    General Examination: The patient is a very pleasant 72 y.o. female in no acute distress. She appears well-developed and well-nourished and well groomed.   HEENT: Normocephalic, atraumatic, pupils are equal, round and reactive to light, corrective eyeglasses in place. Extraocular tracking is well-preserved, hearing grossly intact.  Face is symmetric with normal facial animation. Speech is clear with no dysarthria noted. There is no hypophonia. There is no lip, neck/head, jaw or voice tremor. Neck is supple with full range of passive and active motion. There are no carotid bruits on auscultation. Oropharynx exam reveals: mild to moderate mouth dryness, adequate dental hygiene with partial plate on top.  Tongue protrudes centrally and palate elevates symmetrically.    Chest: Clear to auscultation without wheezing, rhonchi or crackles noted.   Heart: S1+S2+0, regular and normal without murmurs, rubs or gallops noted.    Abdomen: Soft, non-tender and  non-distended.   Extremities: There is no pitting edema in the distal lower extremities bilaterally.  Good pedal pulses.   Skin: Warm and dry without trophic changes noted.    Musculoskeletal: exam reveals no obvious joint deformities.    Neurologically:  Mental status: The patient is awake, alert and oriented in all 4 spheres. Her immediate and remote memory, attention, language skills and fund of knowledge are appropriate. There is no evidence of aphasia, agnosia, apraxia or anomia. Speech is clear with normal prosody and enunciation. Thought process is linear. Mood is normal and affect is normal.  Cranial nerves II - XII are as described above under HEENT exam.  Motor exam: Normal bulk, strength and tone is noted. There is no obvious resting or action tremor. Fine motor skills and coordination: grossly intact.  Cerebellar testing: No dysmetria or intention tremor. There is no truncal or gait ataxia.  Sensory exam: intact to light touch in the upper and lower extremities.  Gait, station and balance: She stands easily. No veering to one side is noted. No leaning to one side is noted. Posture is age-appropriate and stance is narrow based. Gait shows normal stride length and normal pace. No problems turning are noted.    Assessment and Plan:    In summary, ALAYAH KITTEL is a 72 year old female with an underlying medical history of hypertension, hyperlipidemia, vitamin D deficiency, history of stroke, and overweight state, who presents for follow-up consultation of her recurrent headaches.  History is not telltale for migraine headaches.  Her headaches are probably from a combination of factors including not sleeping well, suboptimal hydration, caffeine overuse, elevated blood pressure values.  She is advised to follow-up with primary care regarding blood pressure management, she also reports that her blood pressure tends  to be elevated at home.  She is counseled again about her medications.   She never tapered off the amitriptyline and added the gabapentin which she does not believe has helped.  She is advised to stop the gabapentin at this time and increase amitriptyline to 75 mg at bedtime.  She still takes 50 mg at bedtime but has a very variable bedtime she indicates.  She does not drink any water.  She is advised to reduce her soda intake and limit herself to up to 1 can of soda per day and increase her water intake to 3-4 bottles of water per day, 16.9 ounce size each.  She was offered printed instructions but declined them, reporting that her daughter will check her online instructions.  I will discontinue her gabapentin prescription.  I will increase her amitriptyline to 75 mg at bedtime.  She is cautioned about side effects of gabapentin as well as amitriptyline.  She reports having no side effects of amitriptyline but is advised that we are increasing it slightly.  She is advised to let us know if she has any interim issues, she will follow-up in this clinic to see one of our nurse practitioners in 4 to 6 months.  Of note, her brain MRI without contrast from 11/11/2021 showed no intracranial acute abnormalities, chronic changes including chronic stroke in the left corona radiata and basal ganglia as well as mild to moderate chronic small vessel ischemic changes, progressed from 2016.  She is advised to make an appointment to discuss blood pressure management with primary care and advised that she probably does not have migraines at this point but more of a combination headache.  I answered all her questions today and she was in agreement with our plan. I spent 25 minutes in total face-to-face time and in reviewing records during pre-charting, more than 50% of which was spent in counseling and coordination of care, reviewing test results, reviewing medications and treatment regimen and/or in discussing or reviewing the diagnosis of mixed headache, the prognosis and treatment options. Pertinent  laboratory and imaging test results that were available during this visit with the patient were reviewed by me and considered in my medical decision making (see chart for details).

## 2023-01-20 ENCOUNTER — Ambulatory Visit
Admission: RE | Admit: 2023-01-20 | Discharge: 2023-01-20 | Disposition: A | Discharge status: Home / Self Care | Payer: Medicare Other | Source: Ambulatory Visit | Attending: Family | Admitting: Family

## 2023-01-20 DIAGNOSIS — Z1231 Encounter for screening mammogram for malignant neoplasm of breast: Secondary | ICD-10-CM

## 2023-02-18 ENCOUNTER — Ambulatory Visit (INDEPENDENT_AMBULATORY_CARE_PROVIDER_SITE_OTHER): Payer: Medicare Other

## 2023-02-18 ENCOUNTER — Ambulatory Visit (INDEPENDENT_AMBULATORY_CARE_PROVIDER_SITE_OTHER): Payer: Medicare Other | Admitting: Family Medicine

## 2023-02-18 ENCOUNTER — Encounter: Payer: Self-pay | Admitting: Family Medicine

## 2023-02-18 VITALS — BP 140/78 | HR 86 | Temp 97.3°F | Ht 64.0 in | Wt 158.0 lb

## 2023-02-18 DIAGNOSIS — W19XXXA Unspecified fall, initial encounter: Secondary | ICD-10-CM | POA: Diagnosis not present

## 2023-02-18 DIAGNOSIS — M25512 Pain in left shoulder: Secondary | ICD-10-CM

## 2023-02-18 MED ORDER — PREDNISONE 20 MG PO TABS
40.0000 mg | ORAL_TABLET | Freq: Every day | ORAL | 0 refills | Status: AC
Start: 2023-02-18 — End: 2023-02-23

## 2023-02-18 NOTE — Progress Notes (Signed)
Subjective:  Patient ID: Krista Finley, female    DOB: May 06, 1951, 72 y.o.   MRN: [id number]  Patient Care Team: Junie Spencer, FNP as PCP - General (Family Medicine) Beryle Beams, MD as Consulting Physician (Neurology) Delora Fuel, [place] (Optometry) Iva Boop, MD as Consulting Physician (Gastroenterology)   Chief Complaint:  Shoulder Pain (Left shoulder pain after a fall x 3 weeks ago )   HPI: Krista Finley is a 72 y.o. female presenting on 02/18/2023 for Shoulder Pain (Left shoulder pain after a fall x 3 weeks ago )   Pt had a fall three weeks ago and states he now has left shoulder pain. No new injuries since fall.   Shoulder Pain  The pain is present in the left shoulder. This is a new problem. The current episode started 1 to 4 weeks ago. There has been a history of trauma. The problem occurs intermittently. The problem has been waxing and waning. The quality of the pain is described as aching and sharp. The pain is moderate. Associated symptoms include a limited range of motion and stiffness. Pertinent negatives include no fever, inability to bear weight, itching, joint locking, joint swelling, numbness or tingling. The symptoms are aggravated by activity. She has tried acetaminophen for the symptoms. The treatment provided no relief.    Relevant past medical, surgical, family, and social history reviewed and updated as indicated.  Allergies and medications reviewed and updated. Data reviewed: Chart in Epic.   Past Medical History:  Diagnosis Date   CVA (cerebral vascular accident) (HCC)    Gastritis    Headache    Hyperlipidemia    Hypertension    Migraine     Past Surgical History:  Procedure Laterality Date   ELBOW SURGERY Right     Social History   Socioeconomic History   Marital status: Single    Spouse name: Not on file   Number of children: Not on file   Years of education: Not on file   Highest education level: High school  graduate  Occupational History   Occupation: retired    Associate Professor: [employer]  Tobacco Use   Smoking status: Never   Smokeless tobacco: Never  Vaping Use   Vaping Use: Never used  Substance and Sexual Activity   Alcohol use: Yes    Comment: 1 drink couple times of year   Drug use: No   Sexual activity: Not Currently  Other Topics Concern   Not on file  Social History Narrative   Stays with her mother and disabled brother   Left handed   Caffeine: tea, maybe 1 or 2 cups/day   Social Determinants of Health   Financial Resource Strain: Low Risk  (12/11/2022)   Overall Financial Resource Strain (CARDIA)    Difficulty of Paying Living Expenses: Not hard at all  Food Insecurity: No Food Insecurity (12/11/2022)   Hunger Vital Sign    Worried About Running Out of Food in the Last Year: Never true    Ran Out of Food in the Last Year: Never true  Transportation Needs: No Transportation Needs (12/11/2022)   PRAPARE - Administrator, Civil Service (Medical): No    Lack of Transportation (Non-Medical): No  Physical Activity: Insufficiently Active (12/11/2022)   Exercise Vital Sign    Days of Exercise per Week: 3 days    Minutes of Exercise per Session: 30 min  Stress: No Stress Concern Present (12/11/2022)   [employer]  of Occupational Health - Occupational Stress Questionnaire    Feeling of Stress : Not at all  Social Connections: Moderately Isolated (12/11/2022)   Social Connection and Isolation Panel [NHANES]    Frequency of Communication with Friends and Family: More than three times a week    Frequency of Social Gatherings with Friends and Family: More than three times a week    Attends Religious Services: More than 4 times per year    Active Member of [employer] or Organizations: No    Attends Banker Meetings: Never    Marital Status: Never married  Intimate Partner Violence: Not At Risk (12/11/2022)   Humiliation, Afraid, Rape, and Kick questionnaire     Fear of Current or Ex-Partner: No    Emotionally Abused: No    Physically Abused: No    Sexually Abused: No    Outpatient Encounter Medications as of 02/18/2023  Medication Sig   amitriptyline (ELAVIL) 50 MG tablet Take 1.5 tablets (75 mg total) by mouth at bedtime.   amLODipine (NORVASC) 5 MG tablet Take 1 tablet (5 mg total) by mouth daily.   aspirin EC (ASPIRIN LOW DOSE) 81 MG tablet Take 1 tablet (81 mg total) by mouth daily.   atorvastatin (LIPITOR) 20 MG tablet TAKE 1 TABLET BY MOUTH ONCE DAILY AT 6 IN THE EVENING   hydrocortisone 1 % lotion Apply 1 application topically 2 (two) times daily.   meloxicam (MOBIC) 7.5 MG tablet TAKE 1 TABLET BY MOUTH EVERY 12 HOURS AS NEEDED FOR PAIN WITH FOOD   metoprolol tartrate (LOPRESSOR) 25 MG tablet TAKE 1 TABLET BY MOUTH TWICE DAILY . APPOINTMENT REQUIRED FOR FUTURE REFILLS   potassium chloride SA (KLOR-CON M20) 20 MEQ tablet Take 1 tablet by mouth once daily   predniSONE (DELTASONE) 20 MG tablet Take 2 tablets (40 mg total) by mouth daily with breakfast for 5 days. 2 po daily for 5 days   SUMAtriptan (IMITREX) 100 MG tablet TAKE 1 TABLET BY MOUTH EVERY 2 HOURS AS NEEDED FOR MIGRAINE. MAY REPEAT IN 2 HOURS IF HEADACHE   triamcinolone cream (KENALOG) 0.1 % Apply 1 Application topically 2 (two) times daily.   Vitamin D, Ergocalciferol, (DRISDOL) 1.25 MG (50000 UNIT) CAPS capsule Take 1 capsule by mouth once a week   No facility-administered encounter medications on file as of 02/18/2023.    No Known Allergies  Review of Systems  Constitutional:  Negative for activity change, appetite change, chills, fatigue and fever.  HENT: Negative.    Eyes: Negative.   Respiratory:  Negative for cough, chest tightness and shortness of breath.   Cardiovascular:  Negative for chest pain, palpitations and leg swelling.  Gastrointestinal:  Negative for blood in stool, constipation, diarrhea, nausea and vomiting.  Endocrine: Negative.   Genitourinary:  Negative  for dysuria, frequency and urgency.  Musculoskeletal:  Positive for arthralgias, myalgias and stiffness. Negative for back pain, gait problem, joint swelling, neck pain and neck stiffness.  Skin: Negative.  Negative for itching.  Allergic/Immunologic: Negative.   Neurological:  Negative for dizziness, tingling, numbness and headaches.  Hematological: Negative.   Psychiatric/Behavioral:  Negative for confusion, hallucinations, sleep disturbance and suicidal ideas.   All other systems reviewed and are negative.       Objective:  BP (!) 140/78   Pulse 86   Temp (!) 97.3 F (36.3 C) (Temporal)   Ht 5\' 4"  (1.626 m)   Wt 158 lb (71.7 kg)   SpO2 98%   BMI 27.12 kg/m  Wt Readings from Last 3 Encounters:  02/18/23 158 lb (71.7 kg)  12/29/22 163 lb 3.2 oz (74 kg)  12/11/22 152 lb (68.9 kg)    Physical Exam Vitals and nursing note reviewed.  Constitutional:      General: She is not in acute distress.    Appearance: Normal appearance. She is well-developed, well-groomed and overweight. She is not ill-appearing, toxic-appearing or diaphoretic.  HENT:     Head: Normocephalic and atraumatic.     Jaw: There is normal jaw occlusion.     Right Ear: Hearing normal.     Left Ear: Hearing normal.     Nose: Nose normal.     Mouth/Throat:     Lips: Pink.     Mouth: Mucous membranes are moist.     Pharynx: Oropharynx is clear. Uvula midline.  Eyes:     General: Lids are normal.     Conjunctiva/sclera: Conjunctivae normal.     Pupils: Pupils are equal, round, and reactive to light.  Neck:     Thyroid: No thyroid mass, thyromegaly or thyroid tenderness.     Vascular: No JVD.     Trachea: Trachea and phonation normal.  Cardiovascular:     Rate and Rhythm: Normal rate and regular rhythm.     Chest Wall: PMI is not displaced.     Pulses: Normal pulses.     Heart sounds: Normal heart sounds. No murmur heard.    No friction rub. No gallop.  Pulmonary:     Effort: Pulmonary effort is  normal. No respiratory distress.     Breath sounds: Normal breath sounds. No wheezing.  Abdominal:     General: There is no abdominal bruit.     Palpations: There is no hepatomegaly or splenomegaly.  Musculoskeletal:     Left shoulder: Tenderness present. No swelling, deformity, effusion, laceration, bony tenderness or crepitus. Decreased range of motion. Normal strength. Normal pulse.     Left upper arm: Normal.     Cervical back: Normal.     Right lower leg: No edema.     Left lower leg: No edema.     Comments: Left shoulder: negative drop arm sign, positive neers and Hawkins sing.   Skin:    General: Skin is warm and dry.     Capillary Refill: Capillary refill takes less than 2 seconds.     Coloration: Skin is not cyanotic, jaundiced or pale.     Findings: No rash.  Neurological:     General: No focal deficit present.     Mental Status: She is alert and oriented to person, place, and time.     Sensory: Sensation is intact.     Motor: Motor function is intact.     Coordination: Coordination is intact.     Gait: Gait is intact.     Deep Tendon Reflexes: Reflexes are normal and symmetric.  Psychiatric:        Attention and Perception: Attention and perception normal.        Mood and Affect: Mood and affect normal.        Speech: Speech normal.        Behavior: Behavior normal. Behavior is cooperative.        Thought Content: Thought content normal.        Cognition and Memory: Cognition and memory normal.        Judgment: Judgment normal.     Results for orders placed or performed in visit on 08/28/22  CMP14+EGFR  Result Value Ref Range   Glucose 90 70 - 99 mg/dL   BUN 13 8 - 27 mg/dL   Creatinine, Ser 4.69 (H) 0.57 - 1.00 mg/dL   eGFR 54 (L) >62 XB/MWU/1.32   BUN/Creatinine Ratio 12 12 - 28   Sodium 145 (H) 134 - 144 mmol/L   Potassium 3.3 (L) 3.5 - 5.2 mmol/L   Chloride 105 96 - 106 mmol/L   CO2 25 20 - 29 mmol/L   Calcium 9.2 8.7 - 10.3 mg/dL   Total Protein 6.8  6.0 - 8.5 g/dL   Albumin 4.2 3.9 - 4.9 g/dL   Globulin, Total 2.6 1.5 - 4.5 g/dL   Albumin/Globulin Ratio 1.6 1.2 - 2.2   Bilirubin Total 0.5 0.0 - 1.2 mg/dL   Alkaline Phosphatase 104 44 - 121 IU/L   AST 15 0 - 40 IU/L   ALT 7 0 - 32 IU/L     X-Ray: shoulder: Degenerative changes, No acute findings. Preliminary x-ray reading by Kari Baars, FNP-C, [facility].   Pertinent labs & imaging results that were available during my care of the patient were reviewed by me and considered in my medical decision making.  Assessment & Plan:  Marysol was seen today for shoulder pain.  Diagnoses and all orders for this visit:  Fall, initial encounter Acute pain of left shoulder No acute findings on imaging. No indications of frozen shoulder. Likely impingement syndrome. Will burst with steroids and refer to PT. If this is not beneficial, will refer to ortho for further evaluation and treatment.  -     DG Shoulder Left; Future -     predniSONE (DELTASONE) 20 MG tablet; Take 2 tablets (40 mg total) by mouth daily with breakfast for 5 days. 2 po daily for 5 days -     Ambulatory referral to Physical Therapy     Continue all other maintenance medications.  Follow up plan: Return in 6 weeks (on 04/01/2023), or if symptoms worsen or fail to improve, for shoulder pain.   Continue healthy lifestyle choices, including diet (rich in fruits, vegetables, and lean proteins, and low in salt and simple carbohydrates) and exercise (at least 30 minutes of moderate physical activity daily).  Educational handout given for shoulder pain.   The above assessment and management plan was discussed with the patient. The patient verbalized understanding of and has agreed to the management plan. Patient is aware to call the clinic if they develop any new symptoms or if symptoms persist or worsen. Patient is aware when to return to the clinic for a follow-up visit. Patient educated on when it is appropriate to go to the  emergency department.   Kari Baars, FNP-C [facility] [phone]

## 2023-02-23 ENCOUNTER — Ambulatory Visit: Payer: Medicare Other | Attending: Family Medicine

## 2023-02-23 ENCOUNTER — Other Ambulatory Visit: Payer: Self-pay

## 2023-02-23 DIAGNOSIS — M25512 Pain in left shoulder: Secondary | ICD-10-CM | POA: Insufficient documentation

## 2023-02-23 DIAGNOSIS — M25612 Stiffness of left shoulder, not elsewhere classified: Secondary | ICD-10-CM | POA: Insufficient documentation

## 2023-02-23 NOTE — Therapy (Signed)
OUTPATIENT PHYSICAL THERAPY SHOULDER EVALUATION   Patient Name: Krista Finley MRN: [id number] DOB:[birth year], 72 y.o., female Today's Date: 02/23/2023  END OF SESSION:  PT End of Session - 02/23/23 0936     Visit Number 1    Number of Visits 12    Date for PT Re-Evaluation 05/21/23    PT Start Time 0938    PT Stop Time 1018    PT Time Calculation (min) 40 min    Activity Tolerance Patient tolerated treatment well    Behavior During Therapy [facility] for tasks assessed/performed             Past Medical History:  Diagnosis Date   CVA (cerebral vascular accident) (HCC)    Gastritis    Headache    Hyperlipidemia    Hypertension    Migraine    Past Surgical History:  Procedure Laterality Date   ELBOW SURGERY Right    Patient Active Problem List   Diagnosis Date Noted   Osteopenia 12/21/2022   Migraine 01/26/2022   Vitamin D deficiency 09/13/2017   Essential hypertension 06/11/2015   HLD (hyperlipidemia) 06/11/2015   REFERRING PROVIDER: Sonny Masters, FNP   REFERRING DIAG: Acute pain of left shoulder   THERAPY DIAG:  Acute pain of left shoulder  Stiffness of left shoulder, not elsewhere classified  Rationale for Evaluation and Treatment: Rehabilitation  ONSET DATE: June 2024  SUBJECTIVE:                                                                                                                                                                                      SUBJECTIVE STATEMENT: Patient reports that she feel while walking at [facility] on last month (she is unable to recall the exact date). She tried to catch herself, but her shoulder has been hurting since. She notes that her pain has been staying about the same since the initial fall. She has not tried using any ice or heat on her shoulder yet for the pain.  Hand dominance: Left  PERTINENT HISTORY: Hypertension, osteopenia, and history of CVA  PAIN:  Are you having pain? Yes: NPRS scale:  5-6/10 Pain location: left superior shoulder Pain description: intermittent sore and throbbing  Aggravating factors: moving her arm, left side lying Relieving factors: none known  PRECAUTIONS: Fall  WEIGHT BEARING RESTRICTIONS: No  FALLS:  Has patient fallen in last 6 months? Yes. 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  PLOF: Independent  PATIENT GOALS: be able to sleep on her left side, reduced pain,  and improved shoulder mobility  NEXT MD VISIT: none scheduled  OBJECTIVE:   PATIENT SURVEYS:  FOTO 46.23  COGNITION: Overall cognitive status: Within functional limits for tasks assessed     SENSATION: Patient reports no numbness or tingling  UPPER EXTREMITY ROM:   Active ROM Right eval Left eval  Shoulder flexion 128 90/110 (PROM); familiar pain  Shoulder extension    Shoulder abduction 122 82; familiar pain   Shoulder adduction    Shoulder internal rotation To T6 To greater trochanter; limited by familiar pain   Shoulder external rotation To T2 To T2  Elbow flexion    Elbow extension    Wrist flexion    Wrist extension    Wrist ulnar deviation    Wrist radial deviation    Wrist pronation    Wrist supination    (Blank rows = not tested)  UPPER EXTREMITY MMT:  MMT Right eval Left eval  Shoulder flexion 4+/5   Shoulder extension    Shoulder abduction 4+/5   Shoulder adduction    Shoulder internal rotation 4/5 4-/5  Shoulder external rotation 4/5 4-/5  Middle trapezius    Lower trapezius    Elbow flexion    Elbow extension    Wrist flexion    Wrist extension    Wrist ulnar deviation    Wrist radial deviation    Wrist pronation    Wrist supination    Grip strength (lbs)    (Blank rows = not tested)  PALPATION:  TTP: left scapular stabilizers, levator scapulae, infraspinatus, teres minor, and  biceps   TODAY'S TREATMENT:                                                                                                                                         DATE:  Modalities: no adverse reaction to today's modalities  Date: 02/23/23 Vaso: Shoulder, 34 degrees; low pressure, 10 mins, Pain; completely resolved pain after today's assessments  PATIENT EDUCATION: Education details: POC, healing, prognosis, and goals for therapy Person educated: Patient Education method: Explanation Education comprehension: verbalized understanding  HOME EXERCISE PROGRAM:   ASSESSMENT:  CLINICAL IMPRESSION: Patient is a 72 y.o. female who was seen today for physical therapy evaluation and treatment for acute left shoulder pain secondary to a fall. She presented with moderate pain severity and irritability with left shoulder active range of motion being the most aggravating to her familiar symptoms. She exhibited decreased left shoulder active range of motion compared to the right with pain being her primary limiting factor. Recommend that she continue with skilled physical therapy to address her impairments to return to her prior level of function.    OBJECTIVE IMPAIRMENTS: decreased activity tolerance, decreased mobility, decreased ROM, decreased strength, hypomobility, impaired UE functional use, and pain.   ACTIVITY LIMITATIONS: carrying, lifting, sleeping, reach over head, and caring for others  PARTICIPATION LIMITATIONS: meal prep, cleaning, laundry, and  shopping  PERSONAL FACTORS: 3+ comorbidities: Hypertension, osteopenia, and history of CVA  are also affecting patient's functional outcome.   REHAB POTENTIAL: Good  CLINICAL DECISION MAKING: Evolving/moderate complexity  EVALUATION COMPLEXITY: Moderate   GOALS: Goals reviewed with patient? Yes  SHORT TERM GOALS: Target date: 03/16/23  Patient will be independent with her initial HEP.  Baseline: Goal status: INITIAL  2.  Patient  will be able to complete her daily activities without her familiar pain exceeding 3/10.  Baseline:  Goal status: INITIAL  3.  Patient will be able to demonstrate at least 110 degrees of left shoulder flexion for improved function reaching overhead.  Baseline:  Goal status: INITIAL  4.  Patient will be able to demonstrate at least 110 degrees of left shoulder abduction for improved function reaching.  Baseline:  Goal status: INITIAL  LONG TERM GOALS: Target date: 04/06/23  Patient will be independent with her advanced HEP.  Baseline:  Goal status: INITIAL  2.  Patient will demonstrate at least 120 degrees of left shoulder flexion for improved function reaching overhead.  Baseline:  Goal status: INITIAL  3.  Patient will be able to cook without her familiar left shoulder pain exceeding 1/10.  Baseline:  Goal status: INITIAL  4.  Patient will be able to lift at least 3 pounds overhead for improved function putting her dishes away with her left hand.  Baseline:  Goal status: INITIAL  5.  Patient will report being able to sleep throughout the night without being awakened by her familiar left shoulder pain.  Baseline:  Goal status: INITIAL  6.  Patient will be able to demonstrate at least 120 degrees of left shoulder abduction for improved function with overhead activities.  Baseline:  Goal status: INITIAL  PLAN:  PT FREQUENCY: 2x/week  PT DURATION: 6 weeks  PLANNED INTERVENTIONS: Therapeutic exercises, Therapeutic activity, Neuromuscular re-education, Patient/Family education, Self Care, Joint mobilization, Electrical stimulation, Cryotherapy, Moist heat, Vasopneumatic device, Manual therapy, and Re-evaluation  PLAN FOR NEXT SESSION: pulleys, isometrics, AAROM, manual therapy, and modalities as needed   Granville Lewis, PT 02/23/2023, 12:25 PM

## 2023-02-26 ENCOUNTER — Ambulatory Visit: Payer: Medicare Other | Admitting: *Deleted

## 2023-02-26 ENCOUNTER — Encounter: Payer: Self-pay | Admitting: *Deleted

## 2023-02-26 DIAGNOSIS — M25512 Pain in left shoulder: Secondary | ICD-10-CM | POA: Diagnosis not present

## 2023-02-26 DIAGNOSIS — M25612 Stiffness of left shoulder, not elsewhere classified: Secondary | ICD-10-CM

## 2023-02-26 NOTE — Therapy (Signed)
OUTPATIENT PHYSICAL THERAPY SHOULDER TREATMENT   Patient Name: Krista Finley MRN: [id number] DOB:[birth year], 72 y.o., female Today's Date: 02/26/2023  END OF SESSION:  PT End of Session - 02/26/23 0930     Visit Number 2    Number of Visits 12    Date for PT Re-Evaluation 05/21/23    PT Start Time 0930    PT Stop Time 1020    PT Time Calculation (min) 50 min             Past Medical History:  Diagnosis Date   CVA (cerebral vascular accident) (HCC)    Gastritis    Headache    Hyperlipidemia    Hypertension    Migraine    Past Surgical History:  Procedure Laterality Date   ELBOW SURGERY Right    Patient Active Problem List   Diagnosis Date Noted   Osteopenia 12/21/2022   Migraine 01/26/2022   Vitamin D deficiency 09/13/2017   Essential hypertension 06/11/2015   HLD (hyperlipidemia) 06/11/2015   REFERRING PROVIDER: Sonny Masters, FNP   REFERRING DIAG: Acute pain of left shoulder   THERAPY DIAG:  Acute pain of left shoulder  Stiffness of left shoulder, not elsewhere classified  Rationale for Evaluation and Treatment: Rehabilitation  ONSET DATE: June 2024  SUBJECTIVE:                                                                                                                                                                                      SUBJECTIVE STATEMENT:  Pt reports 5-6/10 LT shldr throbbing today Hand dominance: Left  PERTINENT HISTORY: Hypertension, osteopenia, and history of CVA  PAIN:  Are you having pain? Yes: NPRS scale: 5-6/10 Pain location: left superior shoulder Pain description: intermittent sore and throbbing  Aggravating factors: moving her arm, left side lying Relieving factors: none known  PRECAUTIONS: Fall  WEIGHT BEARING RESTRICTIONS: No  FALLS:  Has patient fallen in last 6 months? Yes. 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  PLOF: Independent  PATIENT GOALS: be able to sleep on her left side, reduced pain, and improved shoulder mobility  NEXT MD VISIT: none scheduled  OBJECTIVE:   PATIENT SURVEYS:  FOTO 46.23  COGNITION: Overall cognitive status: Within functional limits for tasks assessed     SENSATION: Patient reports no numbness or tingling  UPPER EXTREMITY ROM:   Active ROM Right eval Left eval  Shoulder flexion 128 90/110 (PROM); familiar pain  Shoulder extension    Shoulder abduction 122 82; familiar pain   Shoulder  adduction    Shoulder internal rotation To T6 To greater trochanter; limited by familiar pain   Shoulder external rotation To T2 To T2  Elbow flexion    Elbow extension    Wrist flexion    Wrist extension    Wrist ulnar deviation    Wrist radial deviation    Wrist pronation    Wrist supination    (Blank rows = not tested)  UPPER EXTREMITY MMT:  MMT Right eval Left eval  Shoulder flexion 4+/5   Shoulder extension    Shoulder abduction 4+/5   Shoulder adduction    Shoulder internal rotation 4/5 4-/5  Shoulder external rotation 4/5 4-/5  Middle trapezius    Lower trapezius    Elbow flexion    Elbow extension    Wrist flexion    Wrist extension    Wrist ulnar deviation    Wrist radial deviation    Wrist pronation    Wrist supination    Grip strength (lbs)    (Blank rows = not tested)  PALPATION:  TTP: left scapular stabilizers, levator scapulae, infraspinatus, teres minor, and biceps   TODAY'S TREATMENT:                                                                                                                                         DATE:                                                          02-26-23                                    EXERCISE LOG       LT shldr  Exercise Repetitions and Resistance Comments  Pulleys X 4 mins   UE ranger seated X 4  mins   Supine cane press and flexion x10   Supine AROM punch x10        Blank cell = exercise not performed today  Manual PROM to LT shldr for elevation, IR, and ER   Modalities:   Date:  Vaso: Shoulder, 34 degrees; low pressure, 10 mins, Pain; completely resolved pain after today's assessments Premod x 15 mins LT shldr 80-150hz  PATIENT EDUCATION: Education details: POC, healing, prognosis, and goals for therapy Person educated: Patient Education method: Explanation Education comprehension: verbalized understanding  HOME EXERCISE PROGRAM:   ASSESSMENT:  CLINICAL IMPRESSION:   Pt arrived today doing fair with LT shldr, and  reports throbbing pain this morning. Rx focused on AAROM, AROM as well as PROM for LT shldr all motions. Flexion produced the most pain at ACJ, Did well with Premod/Vaso end of  session   OBJECTIVE IMPAIRMENTS: decreased activity tolerance, decreased mobility, decreased ROM, decreased strength, hypomobility, impaired UE functional use, and pain.   ACTIVITY LIMITATIONS: carrying, lifting, sleeping, reach over head, and caring for others  PARTICIPATION LIMITATIONS: meal prep, cleaning, laundry, and shopping  PERSONAL FACTORS: 3+ comorbidities: Hypertension, osteopenia, and history of CVA  are also affecting patient's functional outcome.   REHAB POTENTIAL: Good  CLINICAL DECISION MAKING: Evolving/moderate complexity  EVALUATION COMPLEXITY: Moderate   GOALS: Goals reviewed with patient? Yes  SHORT TERM GOALS: Target date: 03/16/23  Patient will be independent with her initial HEP.  Baseline: Goal status: INITIAL  2.  Patient will be able to complete her daily activities without her familiar pain exceeding 3/10.  Baseline:  Goal status: INITIAL  3.  Patient will be able to demonstrate at least 110 degrees of left shoulder flexion for improved function reaching overhead.  Baseline:  Goal status: INITIAL  4.  Patient will be able to demonstrate  at least 110 degrees of left shoulder abduction for improved function reaching.  Baseline:  Goal status: INITIAL  LONG TERM GOALS: Target date: 04/06/23  Patient will be independent with her advanced HEP.  Baseline:  Goal status: INITIAL  2.  Patient will demonstrate at least 120 degrees of left shoulder flexion for improved function reaching overhead.  Baseline:  Goal status: INITIAL  3.  Patient will be able to cook without her familiar left shoulder pain exceeding 1/10.  Baseline:  Goal status: INITIAL  4.  Patient will be able to lift at least 3 pounds overhead for improved function putting her dishes away with her left hand.  Baseline:  Goal status: INITIAL  5.  Patient will report being able to sleep throughout the night without being awakened by her familiar left shoulder pain.  Baseline:  Goal status: INITIAL  6.  Patient will be able to demonstrate at least 120 degrees of left shoulder abduction for improved function with overhead activities.  Baseline:  Goal status: INITIAL  PLAN:  PT FREQUENCY: 2x/week  PT DURATION: 6 weeks  PLANNED INTERVENTIONS: Therapeutic exercises, Therapeutic activity, Neuromuscular re-education, Patient/Family education, Self Care, Joint mobilization, Electrical stimulation, Cryotherapy, Moist heat, Vasopneumatic device, Manual therapy, and Re-evaluation  PLAN FOR NEXT SESSION: pulleys, isometrics, AAROM, manual therapy, and modalities as needed   Trinitie Mcgirr,CHRIS, PTA 02/26/2023, 12:44 PM

## 2023-03-02 ENCOUNTER — Encounter: Payer: Medicare Other | Admitting: *Deleted

## 2023-03-05 ENCOUNTER — Encounter: Payer: Medicare Other | Admitting: *Deleted

## 2023-03-22 ENCOUNTER — Encounter: Payer: Self-pay | Admitting: Nurse Practitioner

## 2023-03-22 ENCOUNTER — Ambulatory Visit (INDEPENDENT_AMBULATORY_CARE_PROVIDER_SITE_OTHER): Payer: Medicare Other | Admitting: Nurse Practitioner

## 2023-03-22 VITALS — BP 121/75 | HR 79 | Temp 97.2°F | Ht 64.0 in | Wt 163.4 lb

## 2023-03-22 DIAGNOSIS — G43109 Migraine with aura, not intractable, without status migrainosus: Secondary | ICD-10-CM | POA: Diagnosis not present

## 2023-03-22 MED ORDER — SUMATRIPTAN SUCCINATE 100 MG PO TABS: ORAL_TABLET | ORAL | 0 refills | Status: DC

## 2023-03-22 NOTE — Progress Notes (Signed)
Established Patient Office Visit  Subjective   Patient ID: Krista Finley, female    DOB: 02-Jun-1951  Age: 72 y.o. MRN: [id number]  Chief Complaint  Patient presents with   Migraine    Has had migraine everyday all day for past week.     HPI Krista Finley is a 72 yrs old female seen today as an acute visit for persistent HA. She PH of migraine " I was migraine free for over 1-yr, now it is back". She was seen at an UC and was started on Imitrex, and repots that she ran out of it. Headache: Patient complains of headache. She does have a headache at this time.  Description of Headaches: Location of pain: parietal Radiation of pain?:none Character of pain:aching and throbbing Severity of pain: 8 Accompanying symptoms: photophobia Prodromal sx?: photophobia Rapidity of onset: sudden Typical duration of individual headache: 10 minutes Are most headaches similar in presentation? yes Typical precipitants: None  Temporal Pattern of Headaches: Started having HAs 1 month ago Worst time of day: mid-day Awaken from sleep?: no Seasonal pattern?: no 'Clustering' of HAs over time? no Overall pattern since problem began: unchanged  Degree of Functional Impairment: severe  Current Use of Meds to Treat HA: Abortive meds? sumatriptan PO Daily use? yes  Prophylactic meds? none  Additional Relevant History: History of head/neck trauma? no History of head/neck surgery? no Family h/o headache problems? no Use of meds that might worsen HAs? no Exposure to carbon monoxide? no Substance use: caffeine: daily  Patient Active Problem List   Diagnosis Date Noted   Osteopenia 12/21/2022   Migraine 01/26/2022   Vitamin D deficiency 09/13/2017   Essential hypertension 06/11/2015   HLD (hyperlipidemia) 06/11/2015   Past Medical History:  Diagnosis Date   CVA (cerebral vascular accident) (HCC)    Gastritis    Headache    Hyperlipidemia    Hypertension    Migraine    Past  Surgical History:  Procedure Laterality Date   ELBOW SURGERY Right    Social History   Tobacco Use   Smoking status: Never   Smokeless tobacco: Never  Vaping Use   Vaping status: Never Used  Substance Use Topics   Alcohol use: Yes    Comment: 1 drink couple times of year   Drug use: No   Social History   Socioeconomic History   Marital status: Single    Spouse name: Not on file   Number of children: Not on file   Years of education: Not on file   Highest education level: High school graduate  Occupational History   Occupation: retired    Associate Professor: [employer]  Tobacco Use   Smoking status: Never   Smokeless tobacco: Never  Vaping Use   Vaping status: Never Used  Substance and Sexual Activity   Alcohol use: Yes    Comment: 1 drink couple times of year   Drug use: No   Sexual activity: Not Currently  Other Topics Concern   Not on file  Social History Narrative   Stays with her mother and disabled brother   Left handed   Caffeine: tea, maybe 1 or 2 cups/day   Social Determinants of Health   Financial Resource Strain: Low Risk  (12/11/2022)   Overall Financial Resource Strain (CARDIA)    Difficulty of Paying Living Expenses: Not hard at all  Food Insecurity: No Food Insecurity (12/11/2022)   Hunger Vital Sign    Worried About Radiation protection practitioner of Food  in the Last Year: Never true    Ran Out of Food in the Last Year: Never true  Transportation Needs: No Transportation Needs (12/11/2022)   PRAPARE - Administrator, Civil Service (Medical): No    Lack of Transportation (Non-Medical): No  Physical Activity: Insufficiently Active (12/11/2022)   Exercise Vital Sign    Days of Exercise per Week: 3 days    Minutes of Exercise per Session: 30 min  Stress: No Stress Concern Present (12/11/2022)   [employer] of Occupational Health - Occupational Stress Questionnaire    Feeling of Stress : Not at all  Social Connections: Moderately Isolated (12/11/2022)   Social  Connection and Isolation Panel [NHANES]    Frequency of Communication with Friends and Family: More than three times a week    Frequency of Social Gatherings with Friends and Family: More than three times a week    Attends Religious Services: More than 4 times per year    Active Member of [employer] or Organizations: No    Attends Banker Meetings: Never    Marital Status: Never married  Intimate Partner Violence: Not At Risk (12/11/2022)   Humiliation, Afraid, Rape, and Kick questionnaire    Fear of Current or Ex-Partner: No    Emotionally Abused: No    Physically Abused: No    Sexually Abused: No   Family Status  Relation Name Status   Mother  Alive   Father  Deceased   Sister  (Not Specified)   Brother  (Not Specified)   MGM  (Not Specified)   Mat Aunt  (Not Specified)   Neg Hx  (Not Specified)  No partnership data on file   Family History  Problem Relation Age of Onset   Hypertension Mother    Heart disease Mother    Heart disease Father        MI   Parkinson's disease Sister    Parkinson's disease Brother    Stomach cancer Maternal Grandmother    Stomach cancer Maternal Aunt    Colon cancer Neg Hx    Esophageal cancer Neg Hx    Rectal cancer Neg Hx    Breast cancer Neg Hx    Migraines Neg Hx    No Known Allergies    Review of Systems  Constitutional:  Negative for chills and fever.  Eyes:  Positive for photophobia. Negative for blurred vision and double vision.  Respiratory:  Negative for shortness of breath.   Cardiovascular:  Negative for chest pain.  Gastrointestinal:  Negative for nausea and vomiting.  Neurological:  Positive for headaches. Negative for dizziness, focal weakness and weakness.   Negative unless indicated in HPI   Objective:     BP 121/75   Pulse 79   Temp (!) 97.2 F (36.2 C) (Temporal)   Ht 5\' 4"  (1.626 m)   Wt 163 lb 6.4 oz (74.1 kg)   SpO2 99%   BMI 28.05 kg/m  BP Readings from Last 3 Encounters:  03/22/23 121/75   02/18/23 (!) 140/78  12/29/22 (!) 140/86   Wt Readings from Last 3 Encounters:  03/22/23 163 lb 6.4 oz (74.1 kg)  02/18/23 158 lb (71.7 kg)  12/29/22 163 lb 3.2 oz (74 kg)      Physical Exam Vitals and nursing note reviewed.  Constitutional:      General: She is not in acute distress.    Appearance: Normal appearance.  HENT:     Head: Normocephalic and atraumatic.  Eyes:     General: No scleral icterus.    Extraocular Movements: Extraocular movements intact.     Conjunctiva/sclera: Conjunctivae normal.     Pupils: Pupils are equal, round, and reactive to light.  Neck:     Thyroid: No thyroid mass, thyromegaly or thyroid tenderness.  Cardiovascular:     Rate and Rhythm: Normal rate and regular rhythm.  Pulmonary:     Effort: Pulmonary effort is normal.     Breath sounds: Normal breath sounds.  Musculoskeletal:        General: Normal range of motion.     Cervical back: Normal range of motion and neck supple. No rigidity or tenderness.     Right lower leg: No edema.     Left lower leg: No edema.  Skin:    General: Skin is warm and dry.     Findings: Lesion present. No rash.  Neurological:     Mental Status: She is alert and oriented to person, place, and time. Mental status is at baseline.     Motor: No weakness.     Coordination: Coordination is intact. Romberg sign negative.     Gait: Gait normal.     No results found for any visits on 03/22/23.  Last CBC Lab Results  Component Value Date   WBC 4.7 01/26/2022   HGB 12.2 01/26/2022   HCT 35.4 01/26/2022   MCV 89 01/26/2022   MCH 30.7 01/26/2022   RDW 12.8 01/26/2022   PLT 252 01/26/2022   Last metabolic panel Lab Results  Component Value Date   GLUCOSE 90 08/28/2022   NA 145 (H) 08/28/2022   K 3.3 (L) 08/28/2022   CL 105 08/28/2022   CO2 25 08/28/2022   BUN 13 08/28/2022   CREATININE 1.11 (H) 08/28/2022   EGFR 54 (L) 08/28/2022   CALCIUM 9.2 08/28/2022   PROT 6.8 08/28/2022   ALBUMIN 4.2  08/28/2022   LABGLOB 2.6 08/28/2022   AGRATIO 1.6 08/28/2022   BILITOT 0.5 08/28/2022   ALKPHOS 104 08/28/2022   AST 15 08/28/2022   ALT 7 08/28/2022   ANIONGAP 8 10/13/2021   Last lipids Lab Results  Component Value Date   CHOL 248 (H) 07/28/2021   HDL 77 07/28/2021   LDLCALC 160 (H) 07/28/2021   TRIG 68 07/28/2021   CHOLHDL 3.2 07/28/2021   Last hemoglobin A1c Lab Results  Component Value Date   HGBA1C 5.5 04/26/2015   Last thyroid functions Lab Results  Component Value Date   TSH 0.662 01/26/2022        Assessment & Plan:  Migraine with aura and without status migrainosus, not intractable -     SUMAtriptan Succinate; TAKE 1 TABLET BY MOUTH EVERY 2 HOURS AS NEEDED FOR MIGRAINE. MAY REPEAT IN 2 HOURS IF HEADACHE  Dispense: 30 tablet; Refill: 0   Krista Finley 72 yrs old female seen for HA, no acute distress  Refill for Imitrex provided Avoid Aged Cheeses: cheddar, blue cheese, and Parmesan.  Processed Meats: Items like salami, pepperoni, and hot dogs often contain nitrates and nitrites, which are preservatives that can trigger migraines.  Alcohol: Red wine, beer, and other alcoholic beverages can be migraine triggers. Red wine is particularly noted for its potential to cause migraines due to its histamine and tyramine content.  Caffeinated Drinks: While small amounts of caffeine can help with migraines for some people, excessive caffeine or sudden withdrawal from caffeine can trigger migraines. This includes coffee, tea, and energy drinks.  Chocolate: Contains  both caffeine and beta-phenylethylamine, which can be migraine triggers for some people.  Monosodium Glutamate (MSG): Often used as a flavor enhancer in processed foods and [place] food, MSG can trigger migraines in some individuals.  Pickled and Fermented Foods: Foods like pickles, sauerkraut, and soy sauce can contain high levels of tyramine.  Artificial Sweeteners: Aspartame and other artificial  sweeteners have been reported to trigger migraines in some individuals.  Citrus Fruits: Oranges, lemons, limes, and grapefruits can be migraine triggers for some people due to their acidity and high histamine content.  Foods Containing High Levels of Salt: Excessive salt can lead to dehydration and trigger migraines in some individuals. Processed foods and fast food are often high in salt.  Nuts and Seeds: Some people find that nuts and seeds, such as peanuts, almonds, and sunflower seeds, can trigger migraines.  Dried Fruits: Such as raisins and apricots, which can contain high levels of tyramine.  Ice Cream and Other Cold Foods: For some people, consuming very cold foods or drinks can trigger a migraine.  Keep a Food Diary: Track what you eat and drink, and note any migraine occurrences to help identify potential triggers.  Stay Hydrated: Dehydration can trigger migraines, so drink plenty of water throughout the day.  Eat Regularly: Skipping meals can lead to blood sugar fluctuations that may trigger migraines.   Continue healthy lifestyle choices, including diet (rich in fruits, vegetables, and lean proteins, and low in salt and simple carbohydrates) and exercise (at least 30 minutes of moderate physical activity daily).     The above assessment and management plan was discussed with the patient. The patient verbalized understanding of and has agreed to the management plan. Patient is aware to call the clinic if they develop any new symptoms or if symptoms persist or worsen. Patient is aware when to return to the clinic for a follow-up visit. Patient educated on when it is appropriate to go to the emergency department.   Return in about 1 month (around 04/22/2023) for follow-up with  PCP Christy.    Arrie Aran Santa Lighter, DNP [facility] [address] [place], [place] [address] [phone]

## 2023-04-01 ENCOUNTER — Other Ambulatory Visit: Payer: Self-pay | Admitting: Family

## 2023-04-01 DIAGNOSIS — I10 Essential (primary) hypertension: Secondary | ICD-10-CM

## 2023-04-01 DIAGNOSIS — E785 Hyperlipidemia, unspecified: Secondary | ICD-10-CM

## 2023-04-04 ENCOUNTER — Other Ambulatory Visit: Payer: Self-pay | Admitting: Family

## 2023-05-27 ENCOUNTER — Other Ambulatory Visit: Payer: Self-pay | Admitting: Family

## 2023-05-27 DIAGNOSIS — I10 Essential (primary) hypertension: Secondary | ICD-10-CM

## 2023-05-27 NOTE — Telephone Encounter (Signed)
[facility] pt NTBS 30-d given 04/02/23

## 2023-05-28 ENCOUNTER — Encounter: Payer: Self-pay | Admitting: Family

## 2023-05-28 NOTE — Telephone Encounter (Signed)
LMTCB to make an appt w/[facility]. Also, I sent her a letter.

## 2023-05-28 NOTE — Telephone Encounter (Signed)
Pt scheduled for 06/17/2023

## 2023-06-04 ENCOUNTER — Other Ambulatory Visit: Payer: Self-pay | Admitting: Family

## 2023-06-04 DIAGNOSIS — E785 Hyperlipidemia, unspecified: Secondary | ICD-10-CM

## 2023-06-17 ENCOUNTER — Ambulatory Visit: Payer: Medicare Other | Admitting: Family

## 2023-06-17 ENCOUNTER — Encounter: Payer: Self-pay | Admitting: Family

## 2023-06-17 VITALS — BP 138/82 | HR 76 | Temp 98.3°F | Ht 64.0 in | Wt 155.0 lb

## 2023-06-17 DIAGNOSIS — S29011A Strain of muscle and tendon of front wall of thorax, initial encounter: Secondary | ICD-10-CM

## 2023-06-17 DIAGNOSIS — I10 Essential (primary) hypertension: Secondary | ICD-10-CM

## 2023-06-17 DIAGNOSIS — M8580 Other specified disorders of bone density and structure, unspecified site: Secondary | ICD-10-CM

## 2023-06-17 DIAGNOSIS — E785 Hyperlipidemia, unspecified: Secondary | ICD-10-CM

## 2023-06-17 DIAGNOSIS — G43109 Migraine with aura, not intractable, without status migrainosus: Secondary | ICD-10-CM

## 2023-06-17 DIAGNOSIS — E559 Vitamin D deficiency, unspecified: Secondary | ICD-10-CM

## 2023-06-17 DIAGNOSIS — Z0000 Encounter for general adult medical examination without abnormal findings: Secondary | ICD-10-CM

## 2023-06-17 DIAGNOSIS — Z0001 Encounter for general adult medical examination with abnormal findings: Secondary | ICD-10-CM | POA: Diagnosis not present

## 2023-06-17 MED ORDER — METOPROLOL TARTRATE 25 MG PO TABS: 25.0000 mg | ORAL_TABLET | Freq: Two times a day (BID) | ORAL | 1 refills | Status: DC

## 2023-06-17 MED ORDER — POTASSIUM CHLORIDE CRYS ER 20 MEQ PO TBCR: 20.0000 meq | EXTENDED_RELEASE_TABLET | Freq: Every day | ORAL | 1 refills | Status: DC

## 2023-06-17 MED ORDER — VITAMIN D (ERGOCALCIFEROL) 1.25 MG (50000 UNIT) PO CAPS: 50000.0000 [IU] | ORAL_CAPSULE | ORAL | 0 refills | Status: DC

## 2023-06-17 MED ORDER — ATORVASTATIN CALCIUM 20 MG PO TABS: ORAL_TABLET | ORAL | 1 refills | Status: DC

## 2023-06-17 MED ORDER — AMLODIPINE BESYLATE 5 MG PO TABS: 5.0000 mg | ORAL_TABLET | Freq: Every day | ORAL | 1 refills | Status: DC

## 2023-06-17 MED ORDER — AMITRIPTYLINE HCL 150 MG PO TABS: 150.0000 mg | ORAL_TABLET | Freq: Every day | ORAL | 2 refills | Status: DC

## 2023-06-17 NOTE — Progress Notes (Signed)
Subjective:    Patient ID: Krista Finley, female    DOB: 1951-03-14, 72 y.o.   MRN: [id number]  Chief Complaint  Patient presents with   Medical Management of Chronic Issues    Patient been with out meds for 2 weeks states the office said we would not fill them until appt   PT presents to the office today for CPE and  chronic follow up. She has hx of CVA. She has been diagnosed with migraine and saw a Neurologists. She was started on Topamax, but this caused memory issues. She stopped this and only taking imitrex as needed.    Hypertension This is a chronic problem. The current episode started more than 1 year ago. The problem has been waxing and waning since onset. The problem is uncontrolled. Pertinent negatives include no malaise/fatigue, peripheral edema or shortness of breath. The current treatment provides moderate improvement.  Migraine  This is a chronic problem. The current episode started more than 1 year ago. Episode frequency: once a week. The pain is located in the Temporal region. The pain quality is similar to prior headaches. Pertinent negatives include no phonophobia, photophobia or vomiting. She has tried beta blockers and triptans for the symptoms. The treatment provided mild relief. Her past medical history is significant for hypertension.  Hyperlipidemia This is a chronic problem. The current episode started more than 1 year ago. Pertinent negatives include no shortness of breath. Current antihyperlipidemic treatment includes diet change. The current treatment provides no improvement of lipids. Risk factors for coronary artery disease include hypertension, a sedentary lifestyle, post-menopausal and dyslipidemia.      Review of Systems  Constitutional:  Negative for malaise/fatigue.  Eyes:  Negative for photophobia.  Respiratory:  Negative for shortness of breath.   Gastrointestinal:  Negative for vomiting.  All other systems reviewed and are  negative.   Family History  Problem Relation Age of Onset   Hypertension Mother    Heart disease Mother    Heart disease Father        MI   Parkinson's disease Sister    Parkinson's disease Brother    Stomach cancer Maternal Grandmother    Stomach cancer Maternal Aunt    Colon cancer Neg Hx    Esophageal cancer Neg Hx    Rectal cancer Neg Hx    Breast cancer Neg Hx    Migraines Neg Hx    Social History   Socioeconomic History   Marital status: Single    Spouse name: Not on file   Number of children: Not on file   Years of education: Not on file   Highest education level: High school graduate  Occupational History   Occupation: retired    Associate Professor: [employer]  Tobacco Use   Smoking status: Never   Smokeless tobacco: Never  Vaping Use   Vaping status: Never Used  Substance and Sexual Activity   Alcohol use: Yes    Comment: 1 drink couple times of year   Drug use: No   Sexual activity: Not Currently  Other Topics Concern   Not on file  Social History Narrative   Stays with her mother and disabled brother   Left handed   Caffeine: tea, maybe 1 or 2 cups/day   Social Determinants of Health   Financial Resource Strain: Low Risk  (12/11/2022)   Overall Financial Resource Strain (CARDIA)    Difficulty of Paying Living Expenses: Not hard at all  Food Insecurity: No Food Insecurity (12/11/2022)  Hunger Vital Sign    Worried About Running Out of Food in the Last Year: Never true    Ran Out of Food in the Last Year: Never true  Transportation Needs: No Transportation Needs (12/11/2022)   PRAPARE - Administrator, Civil Service (Medical): No    Lack of Transportation (Non-Medical): No  Physical Activity: Insufficiently Active (12/11/2022)   Exercise Vital Sign    Days of Exercise per Week: 3 days    Minutes of Exercise per Session: 30 min  Stress: No Stress Concern Present (12/11/2022)   [employer] of Occupational Health - Occupational Stress  Questionnaire    Feeling of Stress : Not at all  Social Connections: Moderately Isolated (12/11/2022)   Social Connection and Isolation Panel [NHANES]    Frequency of Communication with Friends and Family: More than three times a week    Frequency of Social Gatherings with Friends and Family: More than three times a week    Attends Religious Services: More than 4 times per year    Active Member of [employer] or Organizations: No    Attends Banker Meetings: Never    Marital Status: Never married       Objective:   Physical Exam Vitals reviewed.  Constitutional:      General: She is not in acute distress.    Appearance: She is well-developed.  HENT:     Head: Normocephalic and atraumatic.  Eyes:     Pupils: Pupils are equal, round, and reactive to light.  Neck:     Thyroid: No thyromegaly.  Cardiovascular:     Rate and Rhythm: Normal rate and regular rhythm.     Heart sounds: Normal heart sounds. No murmur heard. Pulmonary:     Effort: Pulmonary effort is normal. No respiratory distress.     Breath sounds: Normal breath sounds. No wheezing.  Abdominal:     General: Bowel sounds are normal. There is no distension.     Palpations: Abdomen is soft.     Tenderness: There is no abdominal tenderness.  Musculoskeletal:        General: No tenderness. Normal range of motion.     Cervical back: Normal range of motion and neck supple.  Skin:    General: Skin is warm and dry.  Neurological:     Mental Status: She is alert and oriented to person, place, and time.     Cranial Nerves: No cranial nerve deficit.     Deep Tendon Reflexes: Reflexes are normal and symmetric.  Psychiatric:        Behavior: Behavior normal.        Thought Content: Thought content normal.        Judgment: Judgment normal.        BP 138/82   Pulse 76   Temp 98.3 F (36.8 C) (Temporal)   Ht 5\' 4"  (1.626 m)   Wt 155 lb (70.3 kg)   SpO2 96%   BMI 26.61 kg/m   Assessment & Plan:  Krista Finley comes in today with chief complaint of Medical Management of Chronic Issues (Patient been with out meds for 2 weeks states the office said we would not fill them until appt)   Diagnosis and orders addressed:  1. Essential hypertension - amLODipine (NORVASC) 5 MG tablet; Take 1 tablet (5 mg total) by mouth daily. **NEEDS TO BE SEEN BEFORE NEXT REFILL**  Dispense: 90 tablet; Refill: 1 - metoprolol tartrate (LOPRESSOR) 25 MG tablet; Take  1 tablet (25 mg total) by mouth 2 (two) times daily.  Dispense: 180 tablet; Refill: 1 - CMP14+EGFR - CBC with Differential/Platelet  2. Hyperlipidemia, unspecified hyperlipidemia type - atorvastatin (LIPITOR) 20 MG tablet; TAKE 1 TABLET BY MOUTH ONCE DAILY AT  6  PM **NEEDS TO BE SEEN BEFORE NEXT REFILL**  Dispense: 90 tablet; Refill: 1 - CMP14+EGFR - CBC with Differential/Platelet - Lipid panel  3. Muscle strain of chest wall, initial encounter - CMP14+EGFR - CBC with Differential/Platelet  4. Annual physical exam - CMP14+EGFR - CBC with Differential/Platelet - Lipid panel - VITAMIN D 25 Hydroxy (Vit-D Deficiency, Fractures)  5. Migraine with aura and without status migrainosus, not intractable Amitriptyline increased to 150 mg from 100 mg  - amitriptyline (ELAVIL) 150 MG tablet; Take 1 tablet (150 mg total) by mouth at bedtime.  Dispense: 90 tablet; Refill: 2 - CMP14+EGFR - CBC with Differential/Platelet  6. Osteopenia, unspecified location - CMP14+EGFR - CBC with Differential/Platelet  7. Vitamin D deficiency - Vitamin D, Ergocalciferol, (DRISDOL) 1.25 MG (50000 UNIT) CAPS capsule; Take 1 capsule (50,000 Units total) by mouth once a week.  Dispense: 12 capsule; Refill: 0 - CMP14+EGFR - CBC with Differential/Platelet - VITAMIN D 25 Hydroxy (Vit-D Deficiency, Fractures)   Labs pending Continue current medications  Amitriptyline increased to 150 mg from 100 mg  Health Maintenance reviewed Diet and exercise encouraged  Follow up  plan: 4 months    Jannifer Rodney, FNP

## 2023-06-17 NOTE — Patient Instructions (Signed)

## 2023-06-18 ENCOUNTER — Other Ambulatory Visit: Payer: Self-pay | Admitting: Family

## 2023-06-18 LAB — CBC WITH DIFFERENTIAL/PLATELET
Basophils Absolute: 0 10*3/uL (ref 0.0–0.2)
Basos: 1 %
EOS (ABSOLUTE): 0.1 10*3/uL (ref 0.0–0.4)
Eos: 2 %
Hematocrit: 33.4 % — ABNORMAL LOW (ref 34.0–46.6)
Hemoglobin: 10.7 g/dL — ABNORMAL LOW (ref 11.1–15.9)
Immature Grans (Abs): 0 10*3/uL (ref 0.0–0.1)
Immature Granulocytes: 0 %
Lymphocytes Absolute: 1.5 10*3/uL (ref 0.7–3.1)
Lymphs: 32 %
MCH: 29.6 pg (ref 26.6–33.0)
MCHC: 32 g/dL (ref 31.5–35.7)
MCV: 92 fL (ref 79–97)
Monocytes Absolute: 0.5 10*3/uL (ref 0.1–0.9)
Monocytes: 11 %
Neutrophils Absolute: 2.5 10*3/uL (ref 1.4–7.0)
Neutrophils: 54 %
Platelets: 303 10*3/uL (ref 150–450)
RBC: 3.62 x10E6/uL — ABNORMAL LOW (ref 3.77–5.28)
RDW: 12.4 % (ref 11.7–15.4)
WBC: 4.6 10*3/uL (ref 3.4–10.8)

## 2023-06-18 LAB — CMP14+EGFR
ALT: 8 [IU]/L (ref 0–32)
AST: 13 [IU]/L (ref 0–40)
Albumin: 3.9 g/dL (ref 3.9–4.9)
Alkaline Phosphatase: 102 [IU]/L (ref 44–121)
BUN/Creatinine Ratio: 7 — ABNORMAL LOW (ref 12–28)
BUN: 9 mg/dL (ref 8–27)
Bilirubin Total: 0.2 mg/dL (ref 0.0–1.2)
CO2: 26 mmol/L (ref 20–29)
Calcium: 9.6 mg/dL (ref 8.7–10.3)
Chloride: 105 mmol/L (ref 96–106)
Creatinine, Ser: 1.34 mg/dL — ABNORMAL HIGH (ref 0.57–1.00)
Globulin, Total: 2.5 g/dL (ref 1.5–4.5)
Glucose: 78 mg/dL (ref 70–99)
Potassium: 4.2 mmol/L (ref 3.5–5.2)
Sodium: 145 mmol/L — ABNORMAL HIGH (ref 134–144)
Total Protein: 6.4 g/dL (ref 6.0–8.5)
eGFR: 43 mL/min/{1.73_m2} — ABNORMAL LOW (ref >59)

## 2023-06-18 LAB — LIPID PANEL
Chol/HDL Ratio: 2.8 ratio (ref 0.0–4.4)
Cholesterol, Total: 164 mg/dL (ref 100–199)
HDL: 59 mg/dL (ref >39)
LDL Chol Calc (NIH): 95 mg/dL (ref 0–99)
Triglycerides: 49 mg/dL (ref 0–149)
VLDL Cholesterol Cal: 10 mg/dL (ref 5–40)

## 2023-06-18 LAB — VITAMIN D 25 HYDROXY (VIT D DEFICIENCY, FRACTURES): Vit D, 25-Hydroxy: 135 ng/mL — ABNORMAL HIGH (ref 30.0–100.0)

## 2023-06-18 MED ORDER — ATORVASTATIN CALCIUM 40 MG PO TABS: 40.0000 mg | ORAL_TABLET | Freq: Every day | ORAL | 1 refills | Status: DC

## 2023-06-21 ENCOUNTER — Emergency Department (HOSPITAL_COMMUNITY): Payer: Medicare Other

## 2023-06-21 ENCOUNTER — Other Ambulatory Visit: Payer: Self-pay

## 2023-06-21 ENCOUNTER — Observation Stay (HOSPITAL_COMMUNITY)
Admission: EM | Admit: 2023-06-21 | Discharge: 2023-06-22 | Disposition: A | Discharge status: Home / Self Care | Payer: Medicare Other | Attending: Family Medicine | Admitting: Family Medicine

## 2023-06-21 ENCOUNTER — Encounter (HOSPITAL_COMMUNITY): Payer: Self-pay

## 2023-06-21 ENCOUNTER — Other Ambulatory Visit (HOSPITAL_COMMUNITY): Payer: Medicare Other

## 2023-06-21 DIAGNOSIS — I700 Atherosclerosis of aorta: Secondary | ICD-10-CM | POA: Diagnosis not present

## 2023-06-21 DIAGNOSIS — Z8673 Personal history of transient ischemic attack (TIA), and cerebral infarction without residual deficits: Secondary | ICD-10-CM | POA: Insufficient documentation

## 2023-06-21 DIAGNOSIS — I6782 Cerebral ischemia: Secondary | ICD-10-CM | POA: Diagnosis not present

## 2023-06-21 DIAGNOSIS — R2990 Unspecified symptoms and signs involving the nervous system: Secondary | ICD-10-CM | POA: Diagnosis not present

## 2023-06-21 DIAGNOSIS — Z7982 Long term (current) use of aspirin: Secondary | ICD-10-CM | POA: Insufficient documentation

## 2023-06-21 DIAGNOSIS — K047 Periapical abscess without sinus: Secondary | ICD-10-CM | POA: Diagnosis not present

## 2023-06-21 DIAGNOSIS — I10 Essential (primary) hypertension: Secondary | ICD-10-CM | POA: Diagnosis not present

## 2023-06-21 DIAGNOSIS — R4781 Slurred speech: Secondary | ICD-10-CM

## 2023-06-21 DIAGNOSIS — Z79899 Other long term (current) drug therapy: Secondary | ICD-10-CM | POA: Diagnosis not present

## 2023-06-21 DIAGNOSIS — E785 Hyperlipidemia, unspecified: Secondary | ICD-10-CM | POA: Diagnosis not present

## 2023-06-21 DIAGNOSIS — R262 Difficulty in walking, not elsewhere classified: Secondary | ICD-10-CM

## 2023-06-21 DIAGNOSIS — R29818 Other symptoms and signs involving the nervous system: Principal | ICD-10-CM | POA: Insufficient documentation

## 2023-06-21 DIAGNOSIS — R531 Weakness: Principal | ICD-10-CM

## 2023-06-21 LAB — CBC
HCT: 35.1 % — ABNORMAL LOW (ref 36.0–46.0)
Hemoglobin: 11.2 g/dL — ABNORMAL LOW (ref 12.0–15.0)
MCH: 29.2 pg (ref 26.0–34.0)
MCHC: 31.9 g/dL (ref 30.0–36.0)
MCV: 91.6 fL (ref 80.0–100.0)
Platelets: 303 10*3/uL (ref 150–400)
RBC: 3.83 MIL/uL — ABNORMAL LOW (ref 3.87–5.11)
RDW: 13.5 % (ref 11.5–15.5)
WBC: 4.6 10*3/uL (ref 4.0–10.5)
nRBC: 0 % (ref 0.0–0.2)

## 2023-06-21 LAB — DIFFERENTIAL
Abs Immature Granulocytes: 0.01 10*3/uL (ref 0.00–0.07)
Basophils Absolute: 0 10*3/uL (ref 0.0–0.1)
Basophils Relative: 0 %
Eosinophils Absolute: 0.2 10*3/uL (ref 0.0–0.5)
Eosinophils Relative: 3 %
Immature Granulocytes: 0 %
Lymphocytes Relative: 42 %
Lymphs Abs: 1.9 10*3/uL (ref 0.7–4.0)
Monocytes Absolute: 0.5 10*3/uL (ref 0.1–1.0)
Monocytes Relative: 11 %
Neutro Abs: 2 10*3/uL (ref 1.7–7.7)
Neutrophils Relative %: 44 %

## 2023-06-21 LAB — I-STAT CHEM 8, ED
BUN: 9 mg/dL (ref 8–23)
Calcium, Ion: 1.07 mmol/L — ABNORMAL LOW (ref 1.15–1.40)
Chloride: 105 mmol/L (ref 98–111)
Creatinine, Ser: 1.6 mg/dL — ABNORMAL HIGH (ref 0.44–1.00)
Glucose, Bld: 92 mg/dL (ref 70–99)
HCT: 35 % — ABNORMAL LOW (ref 36.0–46.0)
Hemoglobin: 11.9 g/dL — ABNORMAL LOW (ref 12.0–15.0)
Potassium: 3.5 mmol/L (ref 3.5–5.1)
Sodium: 141 mmol/L (ref 135–145)
TCO2: 26 mmol/L (ref 22–32)

## 2023-06-21 LAB — COMPREHENSIVE METABOLIC PANEL
ALT: 10 U/L (ref 0–44)
AST: 18 U/L (ref 15–41)
Albumin: 3.7 g/dL (ref 3.5–5.0)
Alkaline Phosphatase: 88 U/L (ref 38–126)
Anion gap: 13 (ref 5–15)
BUN: 10 mg/dL (ref 8–23)
CO2: 22 mmol/L (ref 22–32)
Calcium: 9.3 mg/dL (ref 8.9–10.3)
Chloride: 104 mmol/L (ref 98–111)
Creatinine, Ser: 1.48 mg/dL — ABNORMAL HIGH (ref 0.44–1.00)
GFR, Estimated: 38 mL/min — ABNORMAL LOW (ref >60)
Glucose, Bld: 94 mg/dL (ref 70–99)
Potassium: 3.3 mmol/L — ABNORMAL LOW (ref 3.5–5.1)
Sodium: 139 mmol/L (ref 135–145)
Total Bilirubin: 0.5 mg/dL (ref 0.3–1.2)
Total Protein: 7.4 g/dL (ref 6.5–8.1)

## 2023-06-21 LAB — PROTIME-INR
INR: 1 (ref 0.8–1.2)
Prothrombin Time: 13 s (ref 11.4–15.2)

## 2023-06-21 LAB — URINALYSIS, ROUTINE W REFLEX MICROSCOPIC
Bilirubin Urine: NEGATIVE
Glucose, UA: NEGATIVE mg/dL
Hgb urine dipstick: NEGATIVE
Ketones, ur: NEGATIVE mg/dL
Leukocytes,Ua: NEGATIVE
Nitrite: NEGATIVE
Protein, ur: NEGATIVE mg/dL
Specific Gravity, Urine: 1.009 (ref 1.005–1.030)
pH: 7 (ref 5.0–8.0)

## 2023-06-21 LAB — APTT: aPTT: 28 s (ref 24–36)

## 2023-06-21 LAB — ETHANOL: Alcohol, Ethyl (B): <10 mg/dL (ref <10)

## 2023-06-21 LAB — RAPID URINE DRUG SCREEN, HOSP PERFORMED
Amphetamines: NOT DETECTED
Barbiturates: NOT DETECTED
Benzodiazepines: NOT DETECTED
Cocaine: NOT DETECTED
Opiates: NOT DETECTED
Tetrahydrocannabinol: NOT DETECTED

## 2023-06-21 MED ORDER — ASPIRIN 81 MG PO TBEC
81.0000 mg | DELAYED_RELEASE_TABLET | Freq: Every day | ORAL | Status: DC
  Administered 2023-06-22: 81 mg via ORAL
  Filled 2023-06-21: qty 1

## 2023-06-21 MED ORDER — LORAZEPAM 2 MG/ML IJ SOLN
0.5000 mg | Freq: Once | INTRAMUSCULAR | Status: DC
  Filled 2023-06-21: qty 1

## 2023-06-21 MED ORDER — ACETAMINOPHEN 160 MG/5ML PO SOLN: 650.0000 mg | ORAL | Status: DC | PRN

## 2023-06-21 MED ORDER — ATORVASTATIN CALCIUM 40 MG PO TABS
40.0000 mg | ORAL_TABLET | Freq: Every day | ORAL | Status: DC
Start: 2023-06-21 — End: 2023-06-22
  Administered 2023-06-21 – 2023-06-22 (×2): 40 mg via ORAL
  Filled 2023-06-21 (×2): qty 1

## 2023-06-21 MED ORDER — CLOPIDOGREL BISULFATE 75 MG PO TABS
75.0000 mg | ORAL_TABLET | Freq: Every day | ORAL | Status: DC
  Administered 2023-06-22: 75 mg via ORAL
  Filled 2023-06-21: qty 1

## 2023-06-21 MED ORDER — ACETAMINOPHEN 325 MG PO TABS: 650.0000 mg | ORAL_TABLET | ORAL | Status: DC | PRN

## 2023-06-21 MED ORDER — ASPIRIN 81 MG PO CHEW
162.0000 mg | CHEWABLE_TABLET | Freq: Once | ORAL | Status: AC
  Administered 2023-06-21: 162 mg via ORAL
  Filled 2023-06-21: qty 2

## 2023-06-21 MED ORDER — AMOXICILLIN 500 MG PO CAPS
1000.0000 mg | ORAL_CAPSULE | Freq: Two times a day (BID) | ORAL | Status: DC
  Administered 2023-06-21 – 2023-06-22 (×2): 1000 mg via ORAL
  Filled 2023-06-21 (×2): qty 2
  Filled 2023-06-21: qty 4
  Filled 2023-06-21: qty 2
  Filled 2023-06-21: qty 4
  Filled 2023-06-21: qty 2

## 2023-06-21 MED ORDER — IOHEXOL 350 MG/ML SOLN
60.0000 mL | Freq: Once | INTRAVENOUS | Status: AC | PRN
  Administered 2023-06-21: 60 mL via INTRAVENOUS

## 2023-06-21 MED ORDER — CLOPIDOGREL BISULFATE 75 MG PO TABS
300.0000 mg | ORAL_TABLET | Freq: Once | ORAL | Status: AC
  Administered 2023-06-21: 300 mg via ORAL
  Filled 2023-06-21: qty 4

## 2023-06-21 MED ORDER — STROKE: EARLY STAGES OF RECOVERY BOOK
Freq: Once | Status: AC
  Filled 2023-06-21: qty 1

## 2023-06-21 MED ORDER — ENOXAPARIN SODIUM 40 MG/0.4ML IJ SOSY
40.0000 mg | PREFILLED_SYRINGE | INTRAMUSCULAR | Status: DC
  Administered 2023-06-21: 40 mg via SUBCUTANEOUS
  Filled 2023-06-21: qty 0.4

## 2023-06-21 MED ORDER — LABETALOL HCL 5 MG/ML IV SOLN: 10.0000 mg | INTRAVENOUS | Status: DC | PRN

## 2023-06-21 MED ORDER — ACETAMINOPHEN 650 MG RE SUPP: 650.0000 mg | RECTAL | Status: DC | PRN

## 2023-06-21 MED ORDER — POTASSIUM CHLORIDE CRYS ER 20 MEQ PO TBCR
40.0000 meq | EXTENDED_RELEASE_TABLET | Freq: Once | ORAL | Status: AC
  Administered 2023-06-21: 40 meq via ORAL
  Filled 2023-06-21: qty 2

## 2023-06-21 MED ORDER — SENNOSIDES-DOCUSATE SODIUM 8.6-50 MG PO TABS
1.0000 | ORAL_TABLET | Freq: Every evening | ORAL | Status: DC | PRN
Start: 2023-06-21 — End: 2023-06-22

## 2023-06-21 NOTE — ED Notes (Signed)
ED TO INPATIENT HANDOFF REPORT  ED Nurse Name and Phone #: Leanord Hawking RN [phone]  S Name/Age/Gender Krista Finley 72 y.o. female Room/Bed: APA02/APA02  Code Status   Code Status: Prior  Home/SNF/Other Home Patient oriented to: self, time, situation, and place Is this baseline? Yes   Triage Complete: Triage complete  Chief Complaint Stroke-like symptoms [R29.90]  Triage Note BIB EMS for possible stroke  REMS called code stroke in the field due to pt had slurred speech, abnormal gait, weakness, and LKW at 1230. Pt also started new medications recently.    Allergies No Known Allergies  Level of Care/Admitting Diagnosis ED Disposition     ED Disposition  Admit   Condition  --   Comment  Hospital Area: [facility] [[id number]]  Level of Care: Telemetry [5]  Covid Evaluation: Asymptomatic - no recent exposure (last 10 days) testing not required  Diagnosis: Stroke-like symptoms [725552]  Admitting Physician: Onnie Boer [[id number]]  Attending Physician: Onnie Boer [phone]  Certification:: I certify this patient will need inpatient services for at least 2 midnights  Expected Medical Readiness: 06/23/2023          B Medical/Surgery History Past Medical History:  Diagnosis Date   CVA (cerebral vascular accident) (HCC)    Gastritis    Headache    Hyperlipidemia    Hypertension    Migraine    Past Surgical History:  Procedure Laterality Date   ELBOW SURGERY Right      A IV Location/Drains/Wounds Patient Lines/Drains/Airways Status     Active Line/Drains/Airways     Name Placement date Placement time Site Days   Peripheral IV 06/21/23 22 G 1" Anterior;Right Forearm 06/21/23  1542  Forearm  less than 1            Intake/Output Last 24 hours No intake or output data in the 24 hours ending 06/21/23 1927  Labs/Imaging Results for orders placed or performed during the hospital encounter of 06/21/23 (from the past 48 hour(s))   Ethanol     Status: None   Collection Time: 06/21/23  3:19 PM  Result Value Ref Range   Alcohol, Ethyl (B) <10 <10 mg/dL    Comment: (NOTE) Lowest detectable limit for serum alcohol is 10 mg/dL.  For medical purposes only. Performed at [facility], [address] [place], [place] [address]   CBC     Status: Abnormal   Collection Time: 06/21/23  3:19 PM  Result Value Ref Range   WBC 4.6 4.0 - 10.5 K/uL   RBC 3.83 (L) 3.87 - 5.11 MIL/uL   Hemoglobin 11.2 (L) 12.0 - 15.0 g/dL   HCT 01.0 (L) 93.2 - 35.5 %   MCV 91.6 80.0 - 100.0 fL   MCH 29.2 26.0 - 34.0 pg   MCHC 31.9 30.0 - 36.0 g/dL   RDW 73.2 20.2 - 54.2 %   Platelets 303 150 - 400 K/uL   nRBC 0.0 0.0 - 0.2 %    Comment: Performed at [facility], [address] [place], [place] [address]  Differential     Status: None   Collection Time: 06/21/23  3:19 PM  Result Value Ref Range   Neutrophils Relative % 44 %   Neutro Abs 2.0 1.7 - 7.7 K/uL   Lymphocytes Relative 42 %   Lymphs Abs 1.9 0.7 - 4.0 K/uL   Monocytes Relative 11 %   Monocytes Absolute 0.5 0.1 - 1.0 K/uL   Eosinophils Relative 3 %  Eosinophils Absolute 0.2 0.0 - 0.5 K/uL   Basophils Relative 0 %   Basophils Absolute 0.0 0.0 - 0.1 K/uL   Immature Granulocytes 0 %   Abs Immature Granulocytes 0.01 0.00 - 0.07 K/uL    Comment: Performed at [facility], [address] [place], [place] [address]  Comprehensive metabolic panel     Status: Abnormal   Collection Time: 06/21/23  3:19 PM  Result Value Ref Range   Sodium 139 135 - 145 mmol/L   Potassium 3.3 (L) 3.5 - 5.1 mmol/L   Chloride 104 98 - 111 mmol/L   CO2 22 22 - 32 mmol/L   Glucose, Bld 94 70 - 99 mg/dL    Comment: Glucose reference range applies only to samples taken after fasting for at least 8 hours.   BUN 10 8 - 23 mg/dL   Creatinine, Ser 8.65 (H) 0.44 - 1.00 mg/dL   Calcium 9.3 8.9 - 78.4 mg/dL   Total Protein 7.4 6.5 - 8.1 g/dL   Albumin 3.7 3.5 - 5.0 g/dL   AST 18 15 - 41 U/L   ALT 10 0 - 44  U/L   Alkaline Phosphatase 88 38 - 126 U/L   Total Bilirubin 0.5 0.3 - 1.2 mg/dL   GFR, Estimated 38 (L) >60 mL/min    Comment: (NOTE) Calculated using the CKD-EPI Creatinine Equation (2021)    Anion gap 13 5 - 15    Comment: Performed at [facility], [address], [place], [place] [address]  I-stat chem 8, ed     Status: Abnormal   Collection Time: 06/21/23  3:21 PM  Result Value Ref Range   Sodium 141 135 - 145 mmol/L   Potassium 3.5 3.5 - 5.1 mmol/L   Chloride 105 98 - 111 mmol/L   BUN 9 8 - 23 mg/dL   Creatinine, Ser 5.28 (H) 0.44 - 1.00 mg/dL   Glucose, Bld 92 70 - 99 mg/dL    Comment: Glucose reference range applies only to samples taken after fasting for at least 8 hours.   Calcium, Ion 1.07 (L) 1.15 - 1.40 mmol/L   TCO2 26 22 - 32 mmol/L   Hemoglobin 11.9 (L) 12.0 - 15.0 g/dL   HCT 41.3 (L) 24.4 - 01.0 %  Urine rapid drug screen (hosp performed)     Status: None   Collection Time: 06/21/23  4:23 PM  Result Value Ref Range   Opiates NONE DETECTED NONE DETECTED   Cocaine NONE DETECTED NONE DETECTED   Benzodiazepines NONE DETECTED NONE DETECTED   Amphetamines NONE DETECTED NONE DETECTED   Tetrahydrocannabinol NONE DETECTED NONE DETECTED   Barbiturates NONE DETECTED NONE DETECTED    Comment: (NOTE) DRUG SCREEN FOR MEDICAL PURPOSES ONLY.  IF CONFIRMATION IS NEEDED FOR ANY PURPOSE, NOTIFY LAB WITHIN 5 DAYS.  LOWEST DETECTABLE LIMITS FOR URINE DRUG SCREEN Drug Class                     Cutoff (ng/mL) Amphetamine and metabolites    1000 Barbiturate and metabolites    200 Benzodiazepine                 200 Opiates and metabolites        300 Cocaine and metabolites        300 THC                            50 Performed at [facility], [address] [place], [place] [address]   Urinalysis, Routine w reflex microscopic -Urine, Clean Catch     Status: Abnormal   Collection Time: 06/21/23  4:23 PM  Result Value Ref Range   Color, Urine STRAW (A) YELLOW   APPearance  CLEAR CLEAR   Specific Gravity, Urine 1.009 1.005 - 1.030   pH 7.0 5.0 - 8.0   Glucose, UA NEGATIVE NEGATIVE mg/dL   Hgb urine dipstick NEGATIVE NEGATIVE   Bilirubin Urine NEGATIVE NEGATIVE   Ketones, ur NEGATIVE NEGATIVE mg/dL   Protein, ur NEGATIVE NEGATIVE mg/dL   Nitrite NEGATIVE NEGATIVE   Leukocytes,Ua NEGATIVE NEGATIVE    Comment: Performed at [facility], [address] [place], [place] [address]  Protime-INR     Status: None   Collection Time: 06/21/23  5:43 PM  Result Value Ref Range   Prothrombin Time 13.0 11.4 - 15.2 seconds   INR 1.0 0.8 - 1.2    Comment: (NOTE) INR goal varies based on device and disease states. Performed at [facility], [address], [place], [place] [address]   APTT     Status: None   Collection Time: 06/21/23  5:43 PM  Result Value Ref Range   aPTT 28 24 - 36 seconds    Comment: Performed at [facility], [address], [place], [place] [address]   CT ANGIO HEAD NECK W WO CM (CODE STROKE)  Result Date: 06/21/2023 CLINICAL DATA:  Neuro deficit, acute, stroke suspected. Slurred speech and right arm weakness. EXAM: CT ANGIOGRAPHY HEAD AND NECK WITH AND WITHOUT CONTRAST TECHNIQUE: Multidetector CT imaging of the head and neck was performed using the standard protocol during bolus administration of intravenous contrast. Multiplanar CT image reconstructions and MIPs were obtained to evaluate the vascular anatomy. Carotid stenosis measurements (when applicable) are obtained utilizing NASCET criteria, using the distal internal carotid diameter as the denominator. RADIATION DOSE REDUCTION: This exam was performed according to the departmental dose-optimization program which includes automated exposure control, adjustment of the mA and/or kV according to patient size and/or use of iterative reconstruction technique. CONTRAST:  60mL OMNIPAQUE IOHEXOL 350 MG/ML SOLN COMPARISON:  Head CT 06/21/2023. MRI brain 11/11/2021. CTA head/neck 04/25/2015. FINDINGS: CTA  NECK FINDINGS Aortic arch: Two-vessel arch configuration with common origin of the right brachiocephalic and left common carotid arteries. Atherosclerotic calcifications of the aortic arch and arch vessel origins. Arch vessel origins are patent. Right carotid system: No evidence of dissection, stenosis (50% or greater), or occlusion. Left carotid system: No evidence of dissection, stenosis (50% or greater), or occlusion. Vertebral arteries: Codominant. No evidence of dissection, stenosis (50% or greater), or occlusion. Skeleton: Mild cervical spondylosis without high-grade spinal canal stenosis. Other neck: Unremarkable. Upper chest: Unremarkable. Review of the MIP images confirms the above findings CTA HEAD FINDINGS Anterior circulation: Calcified plaque along the carotid siphons without hemodynamically significant stenosis. The proximal ACAs and MCAs are patent without stenosis or aneurysm. Distal branches are symmetric. Posterior circulation: Normal basilar artery. The SCAs, AICAs and PICAs are patent proximally. The PCAs are patent proximally without stenosis or aneurysm. Distal branches are symmetric. Venous sinuses: As permitted by contrast timing, patent. Anatomic variants: None. Review of the MIP images confirms the above findings IMPRESSION: No large vessel occlusion, hemodynamically significant stenosis, or aneurysm in the head or neck. Aortic Atherosclerosis (ICD10-I70.0). Code stroke imaging results were communicated on 06/21/2023 at 3:36 pm to provider Dr. Rhae Hammock via telephone, who verbally acknowledged these results. Electronically Signed   By: Orvan Falconer  M.D.   On: 06/21/2023 15:57   CT HEAD CODE STROKE WO CONTRAST  Result Date: 06/21/2023 CLINICAL DATA:  Code stroke. Neuro deficit, acute, stroke suspected. Slurred speech. Right arm weakness. EXAM: CT HEAD WITHOUT CONTRAST TECHNIQUE: Contiguous axial images were obtained from the base of the skull through the vertex without intravenous  contrast. RADIATION DOSE REDUCTION: This exam was performed according to the departmental dose-optimization program which includes automated exposure control, adjustment of the mA and/or kV according to patient size and/or use of iterative reconstruction technique. COMPARISON:  Head CT 10/13/2021.  MRI brain 11/11/2021. FINDINGS: Brain: No acute hemorrhage. Unchanged mild chronic small-vessel disease. Cortical gray-white differentiation is otherwise preserved. Prominence of the ventricles and sulci within expected range for age. No hydrocephalus or extra-axial collection. No mass effect or midline shift. Vascular: No hyperdense vessel or unexpected calcification. Skull: No calvarial fracture or suspicious bone lesion. Skull base is unremarkable. Sinuses/Orbits: No acute finding. Other: None. ASPECTS (Alberta Stroke Program Early CT Score) - Ganglionic level infarction (caudate, lentiform nuclei, internal capsule, insula, M1-M3 cortex): 7 - Supraganglionic infarction (M4-M6 cortex): 3 Total score (0-10 with 10 being normal): 10 IMPRESSION: No acute intracranial hemorrhage or evidence of acute large vessel territory infarct. ASPECT score is 10. Code stroke imaging results were communicated on 06/21/2023 at 3:36 pm to provider Dr. Rhae Hammock via telephone, who verbally acknowledged these results. Electronically Signed   By: Orvan Falconer M.D.   On: 06/21/2023 15:39    Pending Labs Unresulted Labs (From admission, onward)    None       Vitals/Pain Today's Vitals   06/21/23 1546 06/21/23 1630 06/21/23 1845 06/21/23 1915  BP:  (!) 157/93 (!) 172/94 (!) 167/95  Pulse:  85 78 78  Resp:   16 15  Temp:    98 F (36.7 C)  TempSrc:    Oral  SpO2:  98% 97% 99%  Weight:      Height: 5\' 1"  (1.549 m)     PainSc:        Isolation Precautions No active isolations  Medications Medications  iohexol (OMNIPAQUE) 350 MG/ML injection 60 mL (60 mLs Intravenous Contrast Given 06/21/23 1535)  aspirin chewable tablet  162 mg (162 mg Oral Given 06/21/23 1639)    Mobility Walks w/ standby assist      Focused Assessments Neuro Assessment Handoff:  Swallow screen pass? Yes    NIH Stroke Scale  Dizziness Present: No Headache Present: No Interval: Initial Level of Consciousness (1a.)   : Alert, keenly responsive LOC Questions (1b. )   : Answers both questions correctly LOC Commands (1c. )   : Performs both tasks correctly Best Gaze (2. )  : Normal Visual (3. )  : No visual loss Facial Palsy (4. )    : Normal symmetrical movements Motor Arm, Left (5a. )   : No drift Motor Arm, Right (5b. ) : No drift Motor Leg, Left (6a. )  : No drift Motor Leg, Right (6b. ) : No drift Limb Ataxia (7. ): Absent Sensory (8. )  : Normal, no sensory loss Best Language (9. )  : No aphasia Dysarthria (10. ): Mild-to-moderate dysarthria, patient slurs at least some words and, at worst, can be understood with some difficulty Extinction/Inattention (11.)   : No Abnormality Complete NIHSS TOTAL: 1     Neuro Assessment:   Neuro Checks:   Initial (06/21/23 1838)  Has TPA been given? No If patient is a Neuro Trauma and patient is going  to OR before floor call report to 4N Charge nurse: [phone] or [phone]   R Recommendations: See Admitting Provider Note  Report given to:   Additional Notes: N/A

## 2023-06-21 NOTE — Progress Notes (Signed)
1454 Stroke cart activated and elert sent to [facility] for prearrival of pt via EMS with stroke-like symptoms. 1500 Dr. Amada Jupiter with [facility] Neurology group paged. 1503 Dr. Amada Jupiter connected via stroke cart. 1510 Pt arrives via stretcher with EMS. Per EMS, pt's family called with pt's c/o slurred speech, visual disturbances, and unsteady gait with LKW 1230. Dr. Amada Jupiter and EDP Rhae Hammock, MD) at bedside assessing pt at this time. Per pt, she has been feeling off balanced since Friday. 1522 Pt transported to CT 1523 Code stroke cancelled per Dr. Amada Jupiter d/t LKW >24h.  1524 No further needs from TSRN and [facility]SRN disconnected from cart at this time.

## 2023-06-21 NOTE — Assessment & Plan Note (Addendum)
Unable to ambulate, reported right-sided weakness slurred speech.  At the time of my evaluation, slightly confused and cannot remember the events of today, speech mostly fluent, not slurred, misses a few words here and there when repeating sentences, with 5/5 strength in all extremities.  No history of stroke, daughter unsure of compliance with aspirin and statins. -Head CT, CTA head and neck negative for acute abnormality -Per teleneurologist, stroke workup, possibly acute ischemic stroke, differentials include Metabolic etiologies. - Aspirin - dose 81mg  and plavix 75mg  daily after 300mg  load  -MRI brain wo contrast -Lipid panel, hemoglobin A1c -Echocardiogram -PT, speech therapy consult -Patient's dose of amitriptyline was also doubled from 75 mg to 150 mg 10/24 unknown if this could be contributing, held for now -Allow for permissive hypertension.

## 2023-06-21 NOTE — ED Notes (Signed)
Pt assisted to the [facility] with standby assistance.

## 2023-06-21 NOTE — ED Triage Notes (Signed)
BIB EMS for possible stroke

## 2023-06-21 NOTE — ED Provider Notes (Signed)
EMERGENCY DEPARTMENT AT [facility] Provider Note   CSN: [id number] Arrival date & time: 06/21/23  1510     History  Chief Complaint  Patient presents with   Possible Stroke    Krista Finley is a 72 y.o. female.  72 year old female with past medical history of hypertension hyperlipidemia presenting to the emergency department today with apparent right sided weakness.  The patient apparently is been having difficulty ambulating now for the past 2 days or so.  There were some different reports from medics as well as the patient's family and her roommate.  She appeared to have worsening issues and was unable to ambulate earlier which is why she was brought to the hospital for further evaluation.  The patient denies any headaches.  According to medics there was a call out to her house over the weekend after she started new medication.  They are unsure which medication this was.  According to her family they did stop this and she has not been on this medication since then.  The patient did have some dysarthria as well.  Therefore that the dysarthria was noticeable this afternoon and when they spoke with her at 1230 this was not present.        Home Medications Prior to Admission medications   Medication Sig Start Date End Date Taking? Authorizing Provider  amitriptyline (ELAVIL) 150 MG tablet Take 1 tablet (150 mg total) by mouth at bedtime. 06/17/23  Yes Hawks, Christy A, FNP  amLODipine (NORVASC) 5 MG tablet Take 1 tablet (5 mg total) by mouth daily. **NEEDS TO BE SEEN BEFORE NEXT REFILL** 06/17/23  Yes Hawks, Christy A, FNP  amoxicillin (AMOXIL) 875 MG tablet Take 875 mg by mouth in the morning and at bedtime.   Yes [provider]  aspirin EC (ASPIRIN LOW DOSE) 81 MG tablet Take 1 tablet (81 mg total) by mouth daily. 09/17/22  Yes Hawks, Christy A, FNP  atorvastatin (LIPITOR) 40 MG tablet Take 1 tablet (40 mg total) by mouth daily. 06/18/23 06/17/24  Yes Hawks, Christy A, FNP  ergocalciferol (VITAMIN D2) 1.25 MG (50000 UT) capsule Take 50,000 Units by mouth once a week.   Yes [provider]  gabapentin (NEURONTIN) 100 MG capsule Take 100 mg by mouth at bedtime.   Yes [provider]  meloxicam (MOBIC) 7.5 MG tablet Take 7.5 mg by mouth 2 (two) times daily as needed for pain.   Yes [provider]  metoprolol tartrate (LOPRESSOR) 25 MG tablet Take 1 tablet (25 mg total) by mouth 2 (two) times daily. 06/17/23  Yes Hawks, Christy A, FNP  potassium chloride SA (KLOR-CON M20) 20 MEQ tablet Take 1 tablet (20 mEq total) by mouth daily. **NEEDS TO BE SEEN BEFORE NEXT REFILL** 06/17/23  Yes Hawks, Christy A, FNP  SUMAtriptan (IMITREX) 100 MG tablet TAKE 1 TABLET BY MOUTH EVERY 2 HOURS AS NEEDED FOR MIGRAINE. MAY REPEAT IN 2 HOURS IF HEADACHE 03/22/23   [place] Vena [place], NP      Allergies    Patient has no known allergies.    Review of Systems   Review of Systems  Reason unable to perform ROS: Patient is a poor historian.  Neurological:  Positive for speech difficulty and weakness.  All other systems reviewed and are negative.   Physical Exam Updated Vital Signs BP (!) 157/93   Pulse 85   Temp 97.9 F (36.6 C) (Oral)   Resp 20   Ht 5\' 1"  (  1.549 m)   Wt 77.1 kg   SpO2 98%   BMI 32.12 kg/m  Physical Exam Vitals and nursing note reviewed.   Gen: NAD Eyes: PERRL, EOMI HEENT: no oropharyngeal swelling Neck: trachea midline Resp: clear to auscultation bilaterally Card: RRR, no murmurs, rubs, or gallops Abd: nontender, nondistended Extremities: no calf tenderness, no edema Vascular: 2+ radial pulses bilaterally, 2+ DP pulses bilaterally Neuro: see NIH stroke scale Skin: no rashes Psyc: acting appropriately   ED Results / Procedures / Treatments   Labs (all labs ordered are listed, but only abnormal results are displayed) Labs Reviewed  CBC - Abnormal; Notable for the following components:       Result Value   RBC 3.83 (*)    Hemoglobin 11.2 (*)    HCT 35.1 (*)    All other components within normal limits  COMPREHENSIVE METABOLIC PANEL - Abnormal; Notable for the following components:   Potassium 3.3 (*)    Creatinine, Ser 1.48 (*)    GFR, Estimated 38 (*)    All other components within normal limits  URINALYSIS, ROUTINE W REFLEX MICROSCOPIC - Abnormal; Notable for the following components:   Color, Urine STRAW (*)    All other components within normal limits  I-STAT CHEM 8, ED - Abnormal; Notable for the following components:   Creatinine, Ser 1.60 (*)    Calcium, Ion 1.07 (*)    Hemoglobin 11.9 (*)    HCT 35.0 (*)    All other components within normal limits  ETHANOL  DIFFERENTIAL  RAPID URINE DRUG SCREEN, HOSP PERFORMED  PROTIME-INR  APTT  I-STAT CHEM 8, ED    EKG None  Radiology CT ANGIO HEAD NECK W WO CM (CODE STROKE)  Result Date: 06/21/2023 CLINICAL DATA:  Neuro deficit, acute, stroke suspected. Slurred speech and right arm weakness. EXAM: CT ANGIOGRAPHY HEAD AND NECK WITH AND WITHOUT CONTRAST TECHNIQUE: Multidetector CT imaging of the head and neck was performed using the standard protocol during bolus administration of intravenous contrast. Multiplanar CT image reconstructions and MIPs were obtained to evaluate the vascular anatomy. Carotid stenosis measurements (when applicable) are obtained utilizing NASCET criteria, using the distal internal carotid diameter as the denominator. RADIATION DOSE REDUCTION: This exam was performed according to the departmental dose-optimization program which includes automated exposure control, adjustment of the mA and/or kV according to patient size and/or use of iterative reconstruction technique. CONTRAST:  60mL OMNIPAQUE IOHEXOL 350 MG/ML SOLN COMPARISON:  Head CT 06/21/2023. MRI brain 11/11/2021. CTA head/neck 04/25/2015. FINDINGS: CTA NECK FINDINGS Aortic arch: Two-vessel arch configuration with common origin of the right  brachiocephalic and left common carotid arteries. Atherosclerotic calcifications of the aortic arch and arch vessel origins. Arch vessel origins are patent. Right carotid system: No evidence of dissection, stenosis (50% or greater), or occlusion. Left carotid system: No evidence of dissection, stenosis (50% or greater), or occlusion. Vertebral arteries: Codominant. No evidence of dissection, stenosis (50% or greater), or occlusion. Skeleton: Mild cervical spondylosis without high-grade spinal canal stenosis. Other neck: Unremarkable. Upper chest: Unremarkable. Review of the MIP images confirms the above findings CTA HEAD FINDINGS Anterior circulation: Calcified plaque along the carotid siphons without hemodynamically significant stenosis. The proximal ACAs and MCAs are patent without stenosis or aneurysm. Distal branches are symmetric. Posterior circulation: Normal basilar artery. The SCAs, AICAs and PICAs are patent proximally. The PCAs are patent proximally without stenosis or aneurysm. Distal branches are symmetric. Venous sinuses: As permitted by contrast timing, patent. Anatomic variants: None. Review of the MIP  images confirms the above findings IMPRESSION: No large vessel occlusion, hemodynamically significant stenosis, or aneurysm in the head or neck. Aortic Atherosclerosis (ICD10-I70.0). Code stroke imaging results were communicated on 06/21/2023 at 3:36 pm to provider Dr. Rhae Hammock via telephone, who verbally acknowledged these results. Electronically Signed   By: Orvan Falconer M.D.   On: 06/21/2023 15:57   CT HEAD CODE STROKE WO CONTRAST  Result Date: 06/21/2023 CLINICAL DATA:  Code stroke. Neuro deficit, acute, stroke suspected. Slurred speech. Right arm weakness. EXAM: CT HEAD WITHOUT CONTRAST TECHNIQUE: Contiguous axial images were obtained from the base of the skull through the vertex without intravenous contrast. RADIATION DOSE REDUCTION: This exam was performed according to the departmental  dose-optimization program which includes automated exposure control, adjustment of the mA and/or kV according to patient size and/or use of iterative reconstruction technique. COMPARISON:  Head CT 10/13/2021.  MRI brain 11/11/2021. FINDINGS: Brain: No acute hemorrhage. Unchanged mild chronic small-vessel disease. Cortical gray-white differentiation is otherwise preserved. Prominence of the ventricles and sulci within expected range for age. No hydrocephalus or extra-axial collection. No mass effect or midline shift. Vascular: No hyperdense vessel or unexpected calcification. Skull: No calvarial fracture or suspicious bone lesion. Skull base is unremarkable. Sinuses/Orbits: No acute finding. Other: None. ASPECTS ([place] Stroke Program Early CT Score) - Ganglionic level infarction (caudate, lentiform nuclei, internal capsule, insula, M1-M3 cortex): 7 - Supraganglionic infarction (M4-M6 cortex): 3 Total score (0-10 with 10 being normal): 10 IMPRESSION: No acute intracranial hemorrhage or evidence of acute large vessel territory infarct. ASPECT score is 10. Code stroke imaging results were communicated on 06/21/2023 at 3:36 pm to provider Dr. Rhae Hammock via telephone, who verbally acknowledged these results. Electronically Signed   By: Orvan Falconer M.D.   On: 06/21/2023 15:39    Procedures Procedures    Medications Ordered in ED Medications  iohexol (OMNIPAQUE) 350 MG/ML injection 60 mL (60 mLs Intravenous Contrast Given 06/21/23 1535)  aspirin chewable tablet 162 mg (162 mg Oral Given 06/21/23 1639)    ED Course/ Medical Decision Making/ A&P                   NIH Stroke Scale: 5              Medical Decision Making 72 year old female with past medical history of hypertension hyperlipidemia presenting to the emergency department today with difficulty ambulating over the past few days as well as some dysarthria.  The patient is difficult historian is difficult to say when exactly her last known well was.   Will further evaluate the patient here with a code stroke with a CT head and CT angiogram of her head neck for further evaluation.  I will reevaluate for ultimate disposition.  The patient's EKG interpreted by me shows a sinus rhythm with a rate of 83 with normal axis, normal intervals with exception of QTc of 510, no significant ST-T changes.  I did speak with Dr. Amada Jupiter.  He recommends against tenecteplase given the unclear last known well time for the patient.  He recommended admission for routine stroke workup if her CT angiogram was negative.  The patient CT angiogram is negative for large vessel occlusion.  She is given aspirin here.  Her blood pressure is elevated but we will allow for permissive hypertension.  She will be admitted for further evaluation management.  Critical care time 36 minutes including reassessments, review of old records, coordination of care with neurology as well as radiology and hospitalist for admission.  Amount and/or Complexity of Data Reviewed Labs: ordered. Radiology: ordered.  Risk OTC drugs. Decision regarding hospitalization.           Final Clinical Impression(s) / ED Diagnoses Final diagnoses:  Right sided weakness    Rx / DC Orders ED Discharge Orders     None         Durwin Glaze, MD 06/21/23 [phone]

## 2023-06-21 NOTE — ED Triage Notes (Signed)
REMS called code stroke in the field due to pt had slurred speech, abnormal gait, weakness, and LKW at 1230. Pt also started new medications recently.

## 2023-06-21 NOTE — Assessment & Plan Note (Addendum)
Blood pressure elevated. -Allow for permissive hypertension in setting of possible acute CVA -IV labetalol for systolic greater than 200/120. -Hold Norvasc, metoprolol

## 2023-06-21 NOTE — H&P (Addendum)
History and Physical    Krista Finley Krista Finley:[id number] DOB: 1951/08/19 DOA: 06/21/2023  PCP: Junie Spencer, FNP   Patient coming from: Home  I have personally briefly reviewed patient's old medical records in [facility]  Chief Complaint: Difficulty walking, slurred speech  HPI: Krista Finley is a 72 y.o. female with medical history significant for hypertension, CVA, gastritis. Patient was brought to the ED with complaints of slurred speech, and difficulty walking.  At the time of my evaluation, patient is awake and alert, she is slightly confused but does not exactly remember the events of today.  Patient's daughter Krista Finley, is at bedside and assist with the history.  Patient lives with her mother.  She reports that 3 days ago, patient was hallucinating- "seeing snakes".  That resolved, and over the weekend she was okay.  This afternoon at about 1245, patient's daughter called patient and talked to her, she sounded normal.  This morning she was able to ambulate, about an hour later, patient's mother called Krista Finley again that patient speech was abnormal.  Patient was sleeping, and she was woken up to talk to her daughter, daughter could not understand what patient was saying-she sounded slurred.  So she went over to see patient, patient was sleeping, was awaked again, patient could not stand, or ambulate, she was a bit confused, and speech was a bit abnormal.  No facial asymmetry noted. Patient has a history of stroke, its unknown if she has been compliant with her medications.  ED Course: Temperature 97.9.  Heart rate 70s to 90s.  Respirate rate 15-20.  Blood pressure systolic 160s to 323.  Sats greater than 97% on room air. Head CT without acute abnormality, CTA head and neck no large vessel occlusion. Neurology was consulted, suspicion for acute ischemic, also metabolic issues in the differential.  Recommended stroke workup.  Review of Systems: As per HPI all other systems  reviewed and negative.  Past Medical History:  Diagnosis Date   CVA (cerebral vascular accident) (HCC)    Gastritis    Headache    Hyperlipidemia    Hypertension    Migraine     Past Surgical History:  Procedure Laterality Date   ELBOW SURGERY Right      reports that she has never smoked. She has never used smokeless tobacco. She reports current alcohol use. She reports that she does not use drugs.  No Known Allergies  Family History  Problem Relation Age of Onset   Hypertension Mother    Heart disease Mother    Heart disease Father        MI   Parkinson's disease Sister    Parkinson's disease Brother    Stomach cancer Maternal Grandmother    Stomach cancer Maternal Aunt    Colon cancer Neg Hx    Esophageal cancer Neg Hx    Rectal cancer Neg Hx    Breast cancer Neg Hx    Migraines Neg Hx     Prior to Admission medications   Medication Sig Start Date End Date Taking? Authorizing Provider  amitriptyline (ELAVIL) 150 MG tablet Take 1 tablet (150 mg total) by mouth at bedtime. 06/17/23  Yes Hawks, Christy A, FNP  amLODipine (NORVASC) 5 MG tablet Take 1 tablet (5 mg total) by mouth daily. **NEEDS TO BE SEEN BEFORE NEXT REFILL** 06/17/23  Yes Hawks, Christy A, FNP  amoxicillin (AMOXIL) 875 MG tablet Take 875 mg by mouth in the morning and at bedtime.   Yes  [provider]  aspirin EC (ASPIRIN LOW DOSE) 81 MG tablet Take 1 tablet (81 mg total) by mouth daily. 09/17/22  Yes Hawks, Christy A, FNP  atorvastatin (LIPITOR) 40 MG tablet Take 1 tablet (40 mg total) by mouth daily. 06/18/23 06/17/24 Yes Hawks, Christy A, FNP  ergocalciferol (VITAMIN D2) 1.25 MG (50000 UT) capsule Take 50,000 Units by mouth once a week.   Yes [provider]  gabapentin (NEURONTIN) 100 MG capsule Take 100 mg by mouth at bedtime.   Yes [provider]  meloxicam (MOBIC) 7.5 MG tablet Take 7.5 mg by mouth 2 (two) times daily as needed for pain.   Yes [provider]   metoprolol tartrate (LOPRESSOR) 25 MG tablet Take 1 tablet (25 mg total) by mouth 2 (two) times daily. 06/17/23  Yes Hawks, Christy A, FNP  potassium chloride SA (KLOR-CON M20) 20 MEQ tablet Take 1 tablet (20 mEq total) by mouth daily. **NEEDS TO BE SEEN BEFORE NEXT REFILL** 06/17/23  Yes Hawks, Christy A, FNP  SUMAtriptan (IMITREX) 100 MG tablet TAKE 1 TABLET BY MOUTH EVERY 2 HOURS AS NEEDED FOR MIGRAINE. MAY REPEAT IN 2 HOURS IF HEADACHE 03/22/23   Martina Sinner, NP    Physical Exam: Vitals:   06/21/23 1543 06/21/23 1545 06/21/23 1546 06/21/23 1630  BP: (!) 181/97 (!) 200/101  (!) 157/93  Pulse: 94 96  85  Resp: 20     Temp: 97.9 F (36.6 C)     TempSrc: Oral     SpO2: 98% 98%  98%  Weight:  77.1 kg    Height:  5\' 4"  (1.626 m) 5\' 1"  (1.549 m)     Constitutional: NAD, calm, comfortable Vitals:   06/21/23 1543 06/21/23 1545 06/21/23 1546 06/21/23 1630  BP: (!) 181/97 (!) 200/101  (!) 157/93  Pulse: 94 96  85  Resp: 20     Temp: 97.9 F (36.6 C)     TempSrc: Oral     SpO2: 98% 98%  98%  Weight:  77.1 kg    Height:  5\' 4"  (1.626 m) 5\' 1"  (1.549 m)    Eyes: PERRL, lids and conjunctivae normal ENMT: Mucous membranes are moist.  Neck: normal, supple, no masses, no thyromegaly Respiratory: clear to auscultation bilaterally, no wheezing, no crackles. Normal respiratory effort. No accessory muscle use.  Cardiovascular: Regular rate and rhythm, no murmurs / rubs / gallops. No extremity edema.  Abdomen: no tenderness, no masses palpated. No hepatosplenomegaly. Bowel sounds positive.  Musculoskeletal: no clubbing / cyanosis. No joint deformity upper and lower extremities. Skin: no rashes, lesions, ulcers. No induration Neurologic:  Neurological:     Mental Status: She is alert.     GCS: GCS eye subscore is 4. GCS verbal subscore is 5. GCS motor subscore is 6.     Comments: Mental Status:  Alert, oriented, thought content appropriate,  Speech mostly fluent without  evidence of aphasia.  She is unable to fully repeat back sentences, keeping a few words here and there. Cranial Nerves:  II:  Peripheral visual fields grossly normal, pupils equal, round, reactive to light III,IV, VI: ptosis not present, extra-ocular motions intact bilaterally  V,VII: smile symmetric, eyebrows raise symmetric, facial light touch sensation equal VIII: hearing grossly normal to voice  XII: midline tongue extension without fassiculations Motor:  Normal tone.  Sensation intact globally.  5/5 strength bilateral lower and upper extremities.    Psychiatric: Awake and alert, slightly confused.   Labs on Admission: I  have personally reviewed following labs and imaging studies  CBC: Recent Labs  Lab 06/17/23 1220 06/21/23 1519 06/21/23 1521  WBC 4.6 4.6  --   NEUTROABS 2.5 2.0  --   HGB 10.7* 11.2* 11.9*  HCT 33.4* 35.1* 35.0*  MCV 92 91.6  --   PLT 303 303  --    Basic Metabolic Panel: Recent Labs  Lab 06/17/23 1220 06/21/23 1519 06/21/23 1521  NA 145* 139 141  K 4.2 3.3* 3.5  CL 105 104 105  CO2 26 22  --   GLUCOSE 78 94 92  BUN 9 10 9   CREATININE 1.34* 1.48* 1.60*  CALCIUM 9.6 9.3  --    Liver Function Tests: Recent Labs  Lab 06/17/23 1220 06/21/23 1519  AST 13 18  ALT 8 10  ALKPHOS 102 88  BILITOT 0.2 0.5  PROT 6.4 7.4  ALBUMIN 3.9 3.7   Urine analysis:    Component Value Date/Time   COLORURINE STRAW (A) 06/21/2023 1623   APPEARANCEUR CLEAR 06/21/2023 1623   APPEARANCEUR Clear 02/16/2019 1020   LABSPEC 1.009 06/21/2023 1623   PHURINE 7.0 06/21/2023 1623   GLUCOSEU NEGATIVE 06/21/2023 1623   GLUCOSEU NEGATIVE 06/20/2013 1039   HGBUR NEGATIVE 06/21/2023 1623   BILIRUBINUR NEGATIVE 06/21/2023 1623   BILIRUBINUR Negative 02/16/2019 1020   KETONESUR NEGATIVE 06/21/2023 1623   PROTEINUR NEGATIVE 06/21/2023 1623   UROBILINOGEN 0.2 04/25/2015 1231   NITRITE NEGATIVE 06/21/2023 1623   LEUKOCYTESUR NEGATIVE 06/21/2023 1623    Radiological  Exams on Admission: CT ANGIO HEAD NECK W WO CM (CODE STROKE)  Result Date: 06/21/2023 CLINICAL DATA:  Neuro deficit, acute, stroke suspected. Slurred speech and right arm weakness. EXAM: CT ANGIOGRAPHY HEAD AND NECK WITH AND WITHOUT CONTRAST TECHNIQUE: Multidetector CT imaging of the head and neck was performed using the standard protocol during bolus administration of intravenous contrast. Multiplanar CT image reconstructions and MIPs were obtained to evaluate the vascular anatomy. Carotid stenosis measurements (when applicable) are obtained utilizing NASCET criteria, using the distal internal carotid diameter as the denominator. RADIATION DOSE REDUCTION: This exam was performed according to the departmental dose-optimization program which includes automated exposure control, adjustment of the mA and/or kV according to patient size and/or use of iterative reconstruction technique. CONTRAST:  60mL OMNIPAQUE IOHEXOL 350 MG/ML SOLN COMPARISON:  Head CT 06/21/2023. MRI brain 11/11/2021. CTA head/neck 04/25/2015. FINDINGS: CTA NECK FINDINGS Aortic arch: Two-vessel arch configuration with common origin of the right brachiocephalic and left common carotid arteries. Atherosclerotic calcifications of the aortic arch and arch vessel origins. Arch vessel origins are patent. Right carotid system: No evidence of dissection, stenosis (50% or greater), or occlusion. Left carotid system: No evidence of dissection, stenosis (50% or greater), or occlusion. Vertebral arteries: Codominant. No evidence of dissection, stenosis (50% or greater), or occlusion. Skeleton: Mild cervical spondylosis without high-grade spinal canal stenosis. Other neck: Unremarkable. Upper chest: Unremarkable. Review of the MIP images confirms the above findings CTA HEAD FINDINGS Anterior circulation: Calcified plaque along the carotid siphons without hemodynamically significant stenosis. The proximal ACAs and MCAs are patent without stenosis or aneurysm.  Distal branches are symmetric. Posterior circulation: Normal basilar artery. The SCAs, AICAs and PICAs are patent proximally. The PCAs are patent proximally without stenosis or aneurysm. Distal branches are symmetric. Venous sinuses: As permitted by contrast timing, patent. Anatomic variants: None. Review of the MIP images confirms the above findings IMPRESSION: No large vessel occlusion, hemodynamically significant stenosis, or aneurysm in the head or neck. Aortic Atherosclerosis (ICD10-I70.0).  Code stroke imaging results were communicated on 06/21/2023 at 3:36 pm to provider Dr. Rhae Hammock via telephone, who verbally acknowledged these results. Electronically Signed   By: Orvan Falconer M.D.   On: 06/21/2023 15:57   CT HEAD CODE STROKE WO CONTRAST  Result Date: 06/21/2023 CLINICAL DATA:  Code stroke. Neuro deficit, acute, stroke suspected. Slurred speech. Right arm weakness. EXAM: CT HEAD WITHOUT CONTRAST TECHNIQUE: Contiguous axial images were obtained from the base of the skull through the vertex without intravenous contrast. RADIATION DOSE REDUCTION: This exam was performed according to the departmental dose-optimization program which includes automated exposure control, adjustment of the mA and/or kV according to patient size and/or use of iterative reconstruction technique. COMPARISON:  Head CT 10/13/2021.  MRI brain 11/11/2021. FINDINGS: Brain: No acute hemorrhage. Unchanged mild chronic small-vessel disease. Cortical gray-white differentiation is otherwise preserved. Prominence of the ventricles and sulci within expected range for age. No hydrocephalus or extra-axial collection. No mass effect or midline shift. Vascular: No hyperdense vessel or unexpected calcification. Skull: No calvarial fracture or suspicious bone lesion. Skull base is unremarkable. Sinuses/Orbits: No acute finding. Other: None. ASPECTS ([place]berta Stroke Program Early CT Score) - Ganglionic level infarction (caudate, lentiform nuclei,  internal capsule, insula, M1-M3 cortex): 7 - Supraganglionic infarction (M4-M6 cortex): 3 Total score (0-10 with 10 being normal): 10 IMPRESSION: No acute intracranial hemorrhage or evidence of acute large vessel territory infarct. ASPECT score is 10. Code stroke imaging results were communicated on 06/21/2023 at 3:36 pm to provider Dr. Rhae Hammock via telephone, who verbally acknowledged these results. Electronically Signed   By: Orvan Falconer M.D.   On: 06/21/2023 15:39    EKG: Pending   Assessment/Plan Principal Problem:   Stroke-like symptoms Active Problems:   Essential hypertension   HLD (hyperlipidemia)   Assessment and Plan: * Stroke-like symptoms Unable to ambulate, reported right-sided weakness slurred speech.  At the time of my evaluation, slightly confused and cannot remember the events of today, speech mostly fluent, not slurred, misses a few words here and there when repeating sentences, with 5/5 strength in all extremities.  No history of stroke, daughter unsure of compliance with aspirin and statins. -Head CT, CTA head and neck negative for acute abnormality -Per teleneurologist, stroke workup, possibly acute ischemic stroke, differentials include Metabolic etiologies. - Aspirin - dose 81mg  and plavix 75mg  daily after 300mg  load  -MRI brain wo contrast -Lipid panel, hemoglobin A1c -Echocardiogram -PT, speech therapy consult -Patient's dose of amitriptyline was also doubled from 75 mg to 150 mg 10/24 unknown if this could be contributing, held for now -Allow for permissive hypertension.   HLD (hyperlipidemia) Resume statins.  Essential hypertension Blood pressure elevated. -Allow for permissive hypertension in setting of possible acute CVA -IV labetalol for systolic greater than 200/120. -Hold Norvasc, metoprolol  Dental infection -Complete outpatient course of antibiotics with amoxicillin  DVT prophylaxis: Lovenox Code Status: FULL code Family Communication: Daughter-  Darcia at bedside Disposition Plan: ~ 1- 2 days Consults called: neurology Admission status: Inpt tele I certify that at the point of admission it is my clinical judgment that the patient will require inpatient hospital care spanning beyond 2 midnights from the point of admission due to high intensity of service, high risk for further deterioration and high frequency of surveillance required.    Author: Onnie Boer, MD 06/21/2023 8:05 PM  For on call review [email or web address].

## 2023-06-21 NOTE — Assessment & Plan Note (Signed)
Resume statins 

## 2023-06-21 NOTE — Consult Note (Addendum)
[facility] Telemedicine Consult   Requesting Provider: Beckey Downing Consult Participants: Referring physician, bedside nursing Location of the provider: [facility] Location of the patient: [facility]  This consult was provided via telemedicine with 2-way video and audio communication. The patient/family was informed that care would be provided in this way and agreed to receive care in this manner.    Chief Complaint: Difficulty walking  HPI: 72 year old female who states that she has been having difficulty walking since "Saturday or Sunday" but at least a "couple of days."  She was dragging her leg and having some slurred speech and therefore she was brought in by EMS, and family reported worsening this morning and therefore she was activated as a code stroke.    LKW: Saturday tpa given?: No, outside of window IR Thrombectomy? No, outside of window Time of teleneurologist evaluation: 15:03  Exam: There were no vitals filed for this visit.  General: On stretcher, NAD  1A: Level of Consciousness - 0 1B: Ask Month and Age - 0 1C: 'Blink Eyes' & 'Squeeze Hands' - 0 2: Test Horizontal Extraocular Movements - 0 3: Test Visual Fields - 1(appears to have a possible right upper quadrantanopia, but I think this could also be patient cooperation/understanding) 4: Test Facial Palsy - 0 5A: Test Left Arm Motor Drift - 0 5B: Test Right Arm Motor Drift - 1 6A: Test Left Leg Motor Drift - 0 6B: Test Right Leg Motor Drift - 1 7: Test Limb Ataxia - 0(she is slow with some tremor bilaterally, but no definite past-pointing) 8: Test Sensation -endorses symmetric sensation 9: Test Language/Aphasia- 1(her speech is slow, and at times it is difficult to get her to follow commands appropriately) 10: Test Dysarthria - 0 11: Test Extinction/Inattention - 1(I am not sure if this was a understanding issue, but she did seem to extinguish in the left lower  extremity a single time.) NIHSS score: Four   Imaging Reviewed: CT head-negative  Labs reviewed in epic and pertinent values follow: Creatinine 1.6   Assessment: 72 year old female with disequilibrium and slurred speech that has been going on for a couple of days.  My suspicion is that she likely has had an acute ischemic stroke, though metabolic issues could be in the differential as well including liver failure, toxic encephalopathy, etc.  She will need further evaluation with MRI, and secondary risk factor modification.  She does appear to have some focal right-sided weakness and will need evaluation for this.  Recommendations:  - HgbA1c, fasting lipid panel - MRI of the brain without contrast - Frequent neuro checks - Echocardiogram - CTA head and neck - Prophylactic therapy-Antiplatelet med: Aspirin - dose 81mg  and plavix 75mg  daily  after 300mg  load  - Risk factor modification - Telemetry monitoring - PT consult, OT consult, Speech consult    Ritta Slot, MD [facility] [phone]  If 7pm- 7am, please page neurology on call as listed in AMION.

## 2023-06-22 ENCOUNTER — Inpatient Hospital Stay (HOSPITAL_BASED_OUTPATIENT_CLINIC_OR_DEPARTMENT_OTHER): Payer: Medicare Other

## 2023-06-22 ENCOUNTER — Inpatient Hospital Stay (HOSPITAL_COMMUNITY): Payer: Medicare Other

## 2023-06-22 DIAGNOSIS — R2990 Unspecified symptoms and signs involving the nervous system: Secondary | ICD-10-CM

## 2023-06-22 DIAGNOSIS — I10 Essential (primary) hypertension: Secondary | ICD-10-CM | POA: Diagnosis not present

## 2023-06-22 LAB — ECHOCARDIOGRAM COMPLETE
Area-P 1/2: 2.19 cm2
Height: 61 in
S' Lateral: 3 cm
Weight: 2720 [oz_av]

## 2023-06-22 LAB — HEMOGLOBIN A1C
Hgb A1c MFr Bld: 4.8 % (ref 4.8–5.6)
Mean Plasma Glucose: 91.06 mg/dL

## 2023-06-22 LAB — LIPID PANEL
Cholesterol: 140 mg/dL (ref 0–200)
HDL: 50 mg/dL (ref >40)
LDL Cholesterol: 78 mg/dL (ref 0–99)
Total CHOL/HDL Ratio: 2.8 {ratio}
Triglycerides: 62 mg/dL (ref <150)
VLDL: 12 mg/dL (ref 0–40)

## 2023-06-22 LAB — HIV ANTIBODY (ROUTINE TESTING W REFLEX): HIV Screen 4th Generation wRfx: NONREACTIVE

## 2023-06-22 NOTE — Care Management CC44 (Signed)
Condition Code 44 Documentation Completed  Patient Details  Name: ATHENIA TOOR MRN: [id number] Date of Birth: 1950-09-06   Condition Code 44 given:  Yes Patient signature on Condition Code 44 notice:  Yes Documentation of 2 MD's agreement:  Yes Code 44 added to claim:  Yes    Villa Herb, LCSWA 06/22/2023, 2:48 PM

## 2023-06-22 NOTE — Progress Notes (Signed)
SLP Cancellation Note  Patient Details Name: Krista Finley MRN: [id number] DOB: November 28, 1950   Cancelled treatment:       Reason Eval/Treat Not Completed: SLP screened, no needs identified, will sign off; SLP screened Pt in room. Pt denies any changes in swallowing, speech, language, or cognition. CT negative for acute changes. SLE will be deferred at this time. Reconsult if indicated. SLP will sign off.  Thank you,  Havery Moros, CCC-SLP [phone]   Lilliah Priego 06/22/2023, 1:43 PM

## 2023-06-22 NOTE — Care Management Obs Status (Signed)
MEDICARE OBSERVATION STATUS NOTIFICATION   Patient Details  Name: Krista Finley MRN: [id number] Date of Birth: 1951/04/24   Medicare Observation Status Notification Given:  Yes    Villa Herb, LCSWA 06/22/2023, 2:48 PM

## 2023-06-22 NOTE — Evaluation (Signed)
Physical Therapy Evaluation Patient Details Name: Krista Finley MRN: [id number] DOB: 1950/10/16 Today's Date: 06/22/2023  History of Present Illness  Krista Finley is a 72 y.o. female with medical history significant for hypertension, CVA, gastritis.  Patient was brought to the ED with complaints of slurred speech, and difficulty walking.  At the time of my evaluation, patient is awake and alert, she is slightly confused but does not exactly remember the events of today.   Patient's daughter Krista Finley, is at bedside and assist with the history.  Patient lives with her mother.  She reports that 3 days ago, patient was hallucinating- "seeing snakes".  That resolved, and over the weekend she was okay.  This afternoon at about 1245, patient's daughter called patient and talked to her, she sounded normal.  This morning she was able to ambulate, about an hour later, patient's mother called Krista Finley again that patient speech was abnormal.  Patient was sleeping, and she was woken up to talk to her daughter, daughter could not understand what patient was saying-she sounded slurred.  So she went over to see patient, patient was sleeping, was awaked again, patient could not stand, or ambulate, she was a bit confused, and speech was a bit abnormal.  No facial asymmetry noted.  Patient has a history of stroke, its unknown if she has been compliant with her medications.   Clinical Impression  Patient functioning near baseline for functional mobility and gait with good return for bed mobility, transfers and ambulating in room/hallway without loss of balance or need for an AD.  Plan:  Patient discharged from physical therapy to care of nursing for ambulation daily as tolerated for length of stay.          If plan is discharge home, recommend the following: Help with stairs or ramp for entrance   Can travel by private vehicle        Equipment Recommendations None recommended by PT  Recommendations for  Other Services       Functional Status Assessment Patient has not had a recent decline in their functional status     Precautions / Restrictions Precautions Precautions: None Restrictions Weight Bearing Restrictions: No      Mobility  Bed Mobility Overal bed mobility: Independent                  Transfers Overall transfer level: Modified independent                      Ambulation/Gait Ambulation/Gait assistance: Modified independent (Device/Increase time) Gait Distance (Feet): 100 Feet Assistive device: None Gait Pattern/deviations: WFL(Within Functional Limits) Gait velocity: decreased     General Gait Details: grossly WFL with good return for ambulating in room and hallway without loss of balance  Stairs            Wheelchair Mobility     Tilt Bed    Modified Rankin (Stroke Patients Only)       Balance Overall balance assessment: No apparent balance deficits (not formally assessed)                                           Pertinent Vitals/Pain Pain Assessment Pain Assessment: No/denies pain    Home Living Family/patient expects to be discharged to:: Private residence Living Arrangements: Parent Available Help at Discharge: Family;Available 24 hours/day Type of Home: Mobile  home Home Access: Ramped entrance       Home Layout: One level Home Equipment: Agricultural consultant (2 wheels);Cane - single point;Grab bars - tub/shower      Prior Function Prior Level of Function : Independent/Modified Independent             Mobility Comments: Tourist information centre manager without AD ADLs Comments: Independent     Extremity/Trunk Assessment   Upper Extremity Assessment Upper Extremity Assessment: Overall WFL for tasks assessed    Lower Extremity Assessment Lower Extremity Assessment: Overall WFL for tasks assessed    Cervical / Trunk Assessment Cervical / Trunk Assessment: Normal  Communication    Communication Communication: No apparent difficulties  Cognition Arousal: Alert Behavior During Therapy: WFL for tasks assessed/performed Overall Cognitive Status: Within Functional Limits for tasks assessed                                          General Comments      Exercises     Assessment/Plan    PT Assessment Patient does not need any further PT services  PT Problem List         PT Treatment Interventions      PT Goals (Current goals can be found in the Care Plan section)  Acute Rehab PT Goals Patient Stated Goal: return home with family to assist PT Goal Formulation: With patient/family Time For Goal Achievement: 06/22/23 Potential to Achieve Goals: Good    Frequency       Co-evaluation               AM-PAC PT "6 Clicks" Mobility  Outcome Measure Help needed turning from your back to your side while in a flat bed without using bedrails?: None Help needed moving from lying on your back to sitting on the side of a flat bed without using bedrails?: None Help needed moving to and from a bed to a chair (including a wheelchair)?: None Help needed standing up from a chair using your arms (e.g., wheelchair or bedside chair)?: None Help needed to walk in hospital room?: None Help needed climbing 3-5 steps with a railing? : A Little 6 Click Score: 23    End of Session   Activity Tolerance: Patient tolerated treatment well Patient left: in bed;with call bell/phone within reach;with family/visitor present Nurse Communication: Mobility status PT Visit Diagnosis: Unsteadiness on feet (R26.81);Other abnormalities of gait and mobility (R26.89);Muscle weakness (generalized) (M62.81)    Time: 1610-9604 PT Time Calculation (min) (ACUTE ONLY): 13 min   Charges:   PT Evaluation $PT Eval Low Complexity: 1 Low PT Treatments $Therapeutic Activity: 8-22 mins PT General Charges $$ ACUTE PT VISIT: 1 Visit         12:05 PM, 06/22/23 Ocie Bob, MPT Physical Therapist with [facility] [phone] office [phone] mobile phone

## 2023-06-22 NOTE — Progress Notes (Addendum)
Patient discharged home with instructions given on medications and follow up visits verbalized  understanding. IV discontinued,catheter intact.Accompanied by staff to an awaiting vehicle.

## 2023-06-22 NOTE — TOC CM/SW Note (Signed)
Transition of Care [facility]) - Inpatient Brief Assessment   Patient Details  Name: Krista Finley MRN: [id number] Date of Birth: December 10, 1950  Transition of Care [facility]) CM/SW Contact:    Villa Herb, LCSWA Phone Number: 06/22/2023, 11:30 AM   Clinical Narrative: Transition of Care [facility]) has reviewed patient and no TOC needs have been identified at this time. We will continue to monitor patient advancement through interdisciplinary progression rounds. If new patient transition needs arise, please place a TOC consult.  Transition of Care Asessment: Insurance and Status: Insurance coverage has been reviewed Patient has primary care physician: Yes Home environment has been reviewed: From home Prior level of function:: Independent Prior/Current Home Services: No current home services Social Determinants of Health Reivew: SDOH reviewed no interventions necessary Readmission risk has been reviewed: Yes Transition of care needs: no transition of care needs at this time

## 2023-06-22 NOTE — Discharge Summary (Signed)
Physician Discharge Summary   Patient: Krista Finley MRN: [id number] DOB: 04-03-1951  Admit date:     06/21/2023  Discharge date: 06/22/23  Discharge Physician: Tyrone Nine   PCP: Junie Spencer, FNP   Recommendations at discharge:  Follow up with PCP for continued migraine management. Stopped amitriptyline with concern for causing stroke like symptoms (negative work up here and symptoms resolved with discontinuation).   Discharge Diagnoses: Principal Problem:   Stroke-like symptoms Active Problems:   Essential hypertension   HLD (hyperlipidemia)   Stroke-like episode  Hospital Course: HPI: Krista Finley is a 72 y.o. female with medical history significant for hypertension, CVA, gastritis. Patient was brought to the ED with complaints of slurred speech, and difficulty walking.  At the time of my evaluation, patient is awake and alert, she is slightly confused but does not exactly remember the events of today.  Patient's daughter Krista Finley, is at bedside and assist with the history.  Patient lives with her mother.  She reports that 3 days ago, patient was hallucinating- "seeing snakes".  That resolved, and over the weekend she was okay.  This afternoon at about 1245, patient's daughter called patient and talked to her, she sounded normal.  This morning she was able to ambulate, about an hour later, patient's mother called Krista Finley again that patient speech was abnormal.  Patient was sleeping, and she was woken up to talk to her daughter, daughter could not understand what patient was saying-she sounded slurred.  So she went over to see patient, patient was sleeping, was awaked again, patient could not stand, or ambulate, she was a bit confused, and speech was a bit abnormal.  No facial asymmetry noted. Patient has a history of stroke, its unknown if she has been compliant with her medications.   ED Course: Temperature 97.9.  Heart rate 70s to 90s.  Respirate rate 15-20.  Blood  pressure systolic 160s to 409.  Sats greater than 97% on room air. Head CT without acute abnormality, CTA head and neck no large vessel occlusion. Neurology was consulted, felt low suspicion for acute ischemic, also metabolic issues in the differential.  Recommended stroke workup. MRI was negative and symptoms remained resolved. Therapy evaluations have recommended no continued follow up. She is stable for discharge per neurology and medical teams with plans to stop amitriptyline.   Assessment and Plan: * Stroke-like symptoms Unable to ambulate, reported right-sided weakness slurred speech.  At the time of my evaluation, slightly confused and cannot remember the events of today, speech mostly fluent, not slurred, misses a few words here and there when repeating sentences, with 5/5 strength in all extremities.  - With supportive care and holding amitriptyline, her symptoms resolved. MRI negative for acute abnormality. Head CT, CTA head and neck negative for acute abnormality. Case discussed with teleneurology on 10/28, Dr. Amada Jupiter, and Dr. Melynda Ripple on 10/29. Ok to DC without addition to home medications.  - Patient's dose of amitriptyline was also doubled from 75 mg to 150 mg 10/24 which is concordant with symptom onset. Holding.    HLD (hyperlipidemia) Resume statins.  Essential hypertension: Continue home Tx's.  Consultants: Neurology Procedures performed: None  Disposition: Home Diet recommendation:  Cardiac diet DISCHARGE MEDICATION: Allergies as of 06/22/2023   No Known Allergies      Medication List     STOP taking these medications    amitriptyline 150 MG tablet Commonly known as: ELAVIL       TAKE these medications  amLODipine 5 MG tablet Commonly known as: NORVASC Take 1 tablet (5 mg total) by mouth daily. **NEEDS TO BE SEEN BEFORE NEXT REFILL**   amoxicillin 875 MG tablet Commonly known as: AMOXIL Take 875 mg by mouth in the morning and at bedtime.   aspirin  EC 81 MG tablet Commonly known as: Aspirin Low Dose Take 1 tablet (81 mg total) by mouth daily.   atorvastatin 40 MG tablet Commonly known as: Lipitor Take 1 tablet (40 mg total) by mouth daily.   ergocalciferol 1.25 MG (50000 UT) capsule Commonly known as: VITAMIN D2 Take 50,000 Units by mouth once a week.   gabapentin 100 MG capsule Commonly known as: NEURONTIN Take 100 mg by mouth at bedtime.   meloxicam 7.5 MG tablet Commonly known as: MOBIC Take 7.5 mg by mouth 2 (two) times daily as needed for pain.   metoprolol tartrate 25 MG tablet Commonly known as: LOPRESSOR Take 1 tablet (25 mg total) by mouth 2 (two) times daily.   potassium chloride SA 20 MEQ tablet Commonly known as: Klor-Con M20 Take 1 tablet (20 mEq total) by mouth daily. **NEEDS TO BE SEEN BEFORE NEXT REFILL**   SUMAtriptan 100 MG tablet Commonly known as: IMITREX TAKE 1 TABLET BY MOUTH EVERY 2 HOURS AS NEEDED FOR MIGRAINE. MAY REPEAT IN 2 HOURS IF HEADACHE        Follow-up Information     Junie Spencer, FNP Follow up.   Specialty: Family Medicine Contact information: [address] [place] [address] [phone]                Discharge Exam: Ceasar Mons Weights   06/21/23 1545  Weight: 77.1 kg  BP (!) 145/98   Pulse 83   Temp 97.8 F (36.6 C) (Oral)   Resp 16   Ht 5\' 1"  (1.549 m)   Wt 77.1 kg   SpO2 100%   BMI 32.12 kg/m   Well-appearing 72yo F with daughter in room reporting back to baseline speech and mentation, asking when she can go home.  Clear, nonlabored RRR, no MRG or edema Alert, oriented, normal speech cranial and peripheral nerves without sensory or motor deficit. Slight endpoint tremble on right (nondominant) hand FTN testing though within functional limits.   Condition at discharge: stable  The results of significant diagnostics from this hospitalization (including imaging, microbiology, ancillary and laboratory) are listed below for reference.   Imaging  Studies: MR BRAIN WO CONTRAST  Result Date: 06/22/2023 CLINICAL DATA:  Sluured speech, cant ambulate- ? right sided weakness. EXAM: MRI HEAD WITHOUT CONTRAST TECHNIQUE: Multiplanar, multiecho pulse sequences of the brain and surrounding structures were obtained without intravenous contrast. COMPARISON:  Brain MR 11/11/21 FINDINGS: Brain: Negative for an acute infarct. No hemorrhage. No hydrocephalus. No extra-axial fluid collection. Sequela of moderate chronic microvascular ischemic change. Mass effect. No mass lesion. Vascular: Normal flow voids. Skull and upper cervical spine: Normal marrow signal. Sinuses/Orbits: No middle ear or mastoid effusion. Paranasal sinuses are clear. Orbits are unremarkable. Other: None. IMPRESSION: No acute intracranial process. Electronically Signed   By: Lorenza Cambridge M.D.   On: 06/22/2023 14:41   CT ANGIO HEAD NECK W WO CM (CODE STROKE)  Result Date: 06/21/2023 CLINICAL DATA:  Neuro deficit, acute, stroke suspected. Slurred speech and right arm weakness. EXAM: CT ANGIOGRAPHY HEAD AND NECK WITH AND WITHOUT CONTRAST TECHNIQUE: Multidetector CT imaging of the head and neck was performed using the standard protocol during bolus administration of intravenous contrast. Multiplanar CT image  reconstructions and MIPs were obtained to evaluate the vascular anatomy. Carotid stenosis measurements (when applicable) are obtained utilizing NASCET criteria, using the distal internal carotid diameter as the denominator. RADIATION DOSE REDUCTION: This exam was performed according to the departmental dose-optimization program which includes automated exposure control, adjustment of the mA and/or kV according to patient size and/or use of iterative reconstruction technique. CONTRAST:  60mL OMNIPAQUE IOHEXOL 350 MG/ML SOLN COMPARISON:  Head CT 06/21/2023. MRI brain 11/11/2021. CTA head/neck 04/25/2015. FINDINGS: CTA NECK FINDINGS Aortic arch: Two-vessel arch configuration with common origin of  the right brachiocephalic and left common carotid arteries. Atherosclerotic calcifications of the aortic arch and arch vessel origins. Arch vessel origins are patent. Right carotid system: No evidence of dissection, stenosis (50% or greater), or occlusion. Left carotid system: No evidence of dissection, stenosis (50% or greater), or occlusion. Vertebral arteries: Codominant. No evidence of dissection, stenosis (50% or greater), or occlusion. Skeleton: Mild cervical spondylosis without high-grade spinal canal stenosis. Other neck: Unremarkable. Upper chest: Unremarkable. Review of the MIP images confirms the above findings CTA HEAD FINDINGS Anterior circulation: Calcified plaque along the carotid siphons without hemodynamically significant stenosis. The proximal ACAs and MCAs are patent without stenosis or aneurysm. Distal branches are symmetric. Posterior circulation: Normal basilar artery. The SCAs, AICAs and PICAs are patent proximally. The PCAs are patent proximally without stenosis or aneurysm. Distal branches are symmetric. Venous sinuses: As permitted by contrast timing, patent. Anatomic variants: None. Review of the MIP images confirms the above findings IMPRESSION: No large vessel occlusion, hemodynamically significant stenosis, or aneurysm in the head or neck. Aortic Atherosclerosis (ICD10-I70.0). Code stroke imaging results were communicated on 06/21/2023 at 3:36 pm to provider Dr. Rhae Hammock via telephone, who verbally acknowledged these results. Electronically Signed   By: Orvan Falconer M.D.   On: 06/21/2023 15:57   CT HEAD CODE STROKE WO CONTRAST  Result Date: 06/21/2023 CLINICAL DATA:  Code stroke. Neuro deficit, acute, stroke suspected. Slurred speech. Right arm weakness. EXAM: CT HEAD WITHOUT CONTRAST TECHNIQUE: Contiguous axial images were obtained from the base of the skull through the vertex without intravenous contrast. RADIATION DOSE REDUCTION: This exam was performed according to the  departmental dose-optimization program which includes automated exposure control, adjustment of the mA and/or kV according to patient size and/or use of iterative reconstruction technique. COMPARISON:  Head CT 10/13/2021.  MRI brain 11/11/2021. FINDINGS: Brain: No acute hemorrhage. Unchanged mild chronic small-vessel disease. Cortical gray-white differentiation is otherwise preserved. Prominence of the ventricles and sulci within expected range for age. No hydrocephalus or extra-axial collection. No mass effect or midline shift. Vascular: No hyperdense vessel or unexpected calcification. Skull: No calvarial fracture or suspicious bone lesion. Skull base is unremarkable. Sinuses/Orbits: No acute finding. Other: None. ASPECTS ([place] Stroke Program Early CT Score) - Ganglionic level infarction (caudate, lentiform nuclei, internal capsule, insula, M1-M3 cortex): 7 - Supraganglionic infarction (M4-M6 cortex): 3 Total score (0-10 with 10 being normal): 10 IMPRESSION: No acute intracranial hemorrhage or evidence of acute large vessel territory infarct. ASPECT score is 10. Code stroke imaging results were communicated on 06/21/2023 at 3:36 pm to provider Dr. Rhae Hammock via telephone, who verbally acknowledged these results. Electronically Signed   By: Orvan Falconer M.D.   On: 06/21/2023 15:39    Microbiology: Results for orders placed or performed in visit on 02/06/21  Novel Coronavirus, NAA ([facility])     Status: Abnormal   Collection Time: 02/06/21 10:25 AM   Specimen: Nasopharyngeal(NP) swabs in vial transport medium  Result Value  Ref Range Status   SARS-CoV-2, NAA Detected (A) Not Detected Final    Comment: Patients who have a positive COVID-19 test result may now have treatment options. Treatment options are available for patients with mild to moderate symptoms and for hospitalized patients. Visit our website at [email or web address] for resources and information. This nucleic acid amplification  test was developed and its performance characteristics determined by [employer]. Nucleic acid amplification tests include RT-PCR and TMA. This test has not been FDA cleared or approved. This test has been authorized by FDA under an Emergency Use Authorization (EUA). This test is only authorized for the duration of time the declaration that circumstances exist justifying the authorization of the emergency use of in vitro diagnostic tests for detection of SARS-CoV-2 virus and/or diagnosis of COVID-19 infection under section 564(b)(1) of the Act, 21 U.S.C. 161WRU-0(A) (1), unless the authorization is terminated or revoked sooner. When diagnostic testing is negativ e, the possibility of a false negative result should be considered in the context of a patient's recent exposures and the presence of clinical signs and symptoms consistent with COVID-19. An individual without symptoms of COVID-19 and who is not shedding SARS-CoV-2 virus would expect to have a negative (not detected) result in this assay.   SARS-COV-2, NAA 2 DAY TAT     Status: None   Collection Time: 02/06/21 10:25 AM  Result Value Ref Range Status   SARS-CoV-2, NAA 2 DAY TAT Performed  Final    Labs: CBC: Recent Labs  Lab 06/17/23 1220 06/21/23 1519 06/21/23 1521  WBC 4.6 4.6  --   NEUTROABS 2.5 2.0  --   HGB 10.7* 11.2* 11.9*  HCT 33.4* 35.1* 35.0*  MCV 92 91.6  --   PLT 303 303  --    Basic Metabolic Panel: Recent Labs  Lab 06/17/23 1220 06/21/23 1519 06/21/23 1521  NA 145* 139 141  K 4.2 3.3* 3.5  CL 105 104 105  CO2 26 22  --   GLUCOSE 78 94 92  BUN 9 10 9   CREATININE 1.34* 1.48* 1.60*  CALCIUM 9.6 9.3  --    Liver Function Tests: Recent Labs  Lab 06/17/23 1220 06/21/23 1519  AST 13 18  ALT 8 10  ALKPHOS 102 88  BILITOT 0.2 0.5  PROT 6.4 7.4  ALBUMIN 3.9 3.7   CBG: No results for input(s): "GLUCAP" in the last 168 hours.  Discharge time spent: greater than 30  minutes.  Signed: Tyrone Nine, MD [facility] 06/22/2023

## 2023-06-23 ENCOUNTER — Telehealth: Payer: Self-pay

## 2023-06-23 ENCOUNTER — Telehealth: Payer: Self-pay | Admitting: Family

## 2023-06-23 NOTE — Telephone Encounter (Signed)
Aware they still need to do the labs.

## 2023-06-23 NOTE — Transitions of Care (Post Inpatient/ED Visit) (Signed)
06/23/2023  Name: Krista Finley MRN: [id number] DOB: 09-19-1950  Today's TOC FU Call Status: Today's TOC FU Call Status:: Successful TOC FU Call Completed TOC FU Call Complete Date: 06/23/23 Patient's Name and Date of Birth confirmed.  Transition Care Management Follow-up Telephone Call Date of Discharge: 06/22/23 [facility] Type of Discharge: Inpatient Admission Primary Inpatient Discharge Diagnosis:: weakness How have you been since you were released from the hospital?: Better Any questions or concerns?: No  Items Reviewed: Did you receive and understand the discharge instructions provided?: Yes Medications obtained,verified, and reconciled?: Yes (Medications Reviewed) Any new allergies since your discharge?: No Dietary orders reviewed?: Yes Do you have support at home?: Yes People in Home: child(ren), adult  Medications Reviewed Today: Medications Reviewed Today     Reviewed by Karena Addison, LPN (Licensed Practical Nurse) on 06/23/23 at 1219  Med List Status: <None>   Medication Order Taking? Sig Documenting Provider Last Dose Status Informant  amLODipine (NORVASC) 5 MG tablet [id number] No Take 1 tablet (5 mg total) by mouth daily. **NEEDS TO BE SEEN BEFORE NEXT REFILL** Junie Spencer, [place] 06/17/2023 Active Family Member, Child  amoxicillin (AMOXIL) 875 MG tablet [id number] No Take 875 mg by mouth in the morning and at bedtime. [provider] 06/17/2023 Active Family Member, Child  aspirin EC (ASPIRIN LOW DOSE) 81 MG tablet [id number] No Take 1 tablet (81 mg total) by mouth daily. Junie Spencer, FNP unknown Active Family Member, Child  atorvastatin (LIPITOR) 40 MG tablet [id number] No Take 1 tablet (40 mg total) by mouth daily. Junie Spencer, [place] 06/17/2023 Active Family Member, Child  ergocalciferol (VITAMIN D2) 1.25 MG (50000 UT) capsule [id number] No Take 50,000 Units by mouth once a week. [provider] 06/17/2023  Active Family Member, Child  gabapentin (NEURONTIN) 100 MG capsule [id number] No Take 100 mg by mouth at bedtime. [provider] unknown Active Family Member, Child  meloxicam (MOBIC) 7.5 MG tablet [id number] No Take 7.5 mg by mouth 2 (two) times daily as needed for pain. [provider] unknown Active Family Member, Child  metoprolol tartrate (LOPRESSOR) 25 MG tablet [id number] No Take 1 tablet (25 mg total) by mouth 2 (two) times daily. Junie Spencer, [place] 06/17/2023 Active Family Member, Child  potassium chloride SA (KLOR-CON M20) 20 MEQ tablet [id number] No Take 1 tablet (20 mEq total) by mouth daily. **NEEDS TO BE SEEN BEFORE NEXT REFILL** Junie Spencer, [place] 06/17/2023 Active Family Member, Child  SUMAtriptan (IMITREX) 100 MG tablet [id number] No TAKE 1 TABLET BY MOUTH EVERY 2 HOURS AS NEEDED FOR MIGRAINE. MAY REPEAT IN 2 HOURS IF HEADACHE [facility] [place] [facility], [place], NP unknown Active Family Member, Child            Home Care and Equipment/Supplies: Were Home Health Services Ordered?: NA Any new equipment or medical supplies ordered?: NA  Functional Questionnaire: Do you need assistance with bathing/showering or dressing?: No Do you need assistance with meal preparation?: No Do you need assistance with eating?: No Do you have difficulty maintaining continence: No Do you need assistance with getting out of bed/getting out of a chair/moving?: No Do you have difficulty managing or taking your medications?: No  Follow up appointments reviewed: PCP Follow-up appointment confirmed?: Yes Date of PCP follow-up appointment?: 06/28/23 Follow-up Provider: [facility] Follow-up appointment confirmed?: NA Do you need transportation to your follow-up appointment?: No Do you understand care options if your condition(s) worsen?: Yes-patient verbalized understanding  SIGNATURE Karena Addison, LPN [facility] Nurse Health Advisor Direct Dial [phone]

## 2023-06-24 ENCOUNTER — Other Ambulatory Visit: Payer: Self-pay | Admitting: Family

## 2023-06-24 ENCOUNTER — Other Ambulatory Visit: Payer: Medicare Other

## 2023-06-24 DIAGNOSIS — I10 Essential (primary) hypertension: Secondary | ICD-10-CM

## 2023-06-24 DIAGNOSIS — R2990 Unspecified symptoms and signs involving the nervous system: Secondary | ICD-10-CM

## 2023-06-24 DIAGNOSIS — E785 Hyperlipidemia, unspecified: Secondary | ICD-10-CM

## 2023-06-24 DIAGNOSIS — Z09 Encounter for follow-up examination after completed treatment for conditions other than malignant neoplasm: Secondary | ICD-10-CM

## 2023-06-24 DIAGNOSIS — R531 Weakness: Secondary | ICD-10-CM

## 2023-06-24 LAB — GLUCOSE, CAPILLARY: Glucose-Capillary: 93 mg/dL (ref 70–99)

## 2023-06-25 LAB — CMP14+EGFR
ALT: 8 [IU]/L (ref 0–32)
AST: 14 [IU]/L (ref 0–40)
Albumin: 3.9 g/dL (ref 3.9–4.9)
Alkaline Phosphatase: 103 [IU]/L (ref 44–121)
BUN/Creatinine Ratio: 10 — ABNORMAL LOW (ref 12–28)
BUN: 18 mg/dL (ref 8–27)
Bilirubin Total: 0.3 mg/dL (ref 0.0–1.2)
CO2: 24 mmol/L (ref 20–29)
Calcium: 9.7 mg/dL (ref 8.7–10.3)
Chloride: 104 mmol/L (ref 96–106)
Creatinine, Ser: 1.79 mg/dL — ABNORMAL HIGH (ref 0.57–1.00)
Globulin, Total: 2.5 g/dL (ref 1.5–4.5)
Glucose: 113 mg/dL — ABNORMAL HIGH (ref 70–99)
Potassium: 4.6 mmol/L (ref 3.5–5.2)
Sodium: 141 mmol/L (ref 134–144)
Total Protein: 6.4 g/dL (ref 6.0–8.5)
eGFR: 30 mL/min/{1.73_m2} — ABNORMAL LOW (ref >59)

## 2023-06-25 LAB — CBC WITH DIFFERENTIAL/PLATELET
Basophils Absolute: 0 10*3/uL (ref 0.0–0.2)
Basos: 0 %
EOS (ABSOLUTE): 0.1 10*3/uL (ref 0.0–0.4)
Eos: 2 %
Hematocrit: 34.3 % (ref 34.0–46.6)
Hemoglobin: 10.8 g/dL — ABNORMAL LOW (ref 11.1–15.9)
Immature Grans (Abs): 0 10*3/uL (ref 0.0–0.1)
Immature Granulocytes: 0 %
Lymphocytes Absolute: 1.4 10*3/uL (ref 0.7–3.1)
Lymphs: 28 %
MCH: 29.3 pg (ref 26.6–33.0)
MCHC: 31.5 g/dL (ref 31.5–35.7)
MCV: 93 fL (ref 79–97)
Monocytes Absolute: 0.4 10*3/uL (ref 0.1–0.9)
Monocytes: 8 %
Neutrophils Absolute: 3.2 10*3/uL (ref 1.4–7.0)
Neutrophils: 62 %
Platelets: 313 10*3/uL (ref 150–450)
RBC: 3.69 x10E6/uL — ABNORMAL LOW (ref 3.77–5.28)
RDW: 12.7 % (ref 11.7–15.4)
WBC: 5.2 10*3/uL (ref 3.4–10.8)

## 2023-06-28 ENCOUNTER — Ambulatory Visit: Payer: Medicare Other | Admitting: Family

## 2023-06-28 ENCOUNTER — Encounter: Payer: Self-pay | Admitting: Family

## 2023-06-28 VITALS — BP 129/74 | HR 70 | Temp 97.3°F | Ht 61.0 in | Wt 156.4 lb

## 2023-06-28 DIAGNOSIS — E785 Hyperlipidemia, unspecified: Secondary | ICD-10-CM

## 2023-06-28 DIAGNOSIS — Z09 Encounter for follow-up examination after completed treatment for conditions other than malignant neoplasm: Secondary | ICD-10-CM

## 2023-06-28 DIAGNOSIS — R2990 Unspecified symptoms and signs involving the nervous system: Secondary | ICD-10-CM

## 2023-06-28 DIAGNOSIS — G43109 Migraine with aura, not intractable, without status migrainosus: Secondary | ICD-10-CM

## 2023-06-28 DIAGNOSIS — I10 Essential (primary) hypertension: Secondary | ICD-10-CM | POA: Diagnosis not present

## 2023-06-28 DIAGNOSIS — G44209 Tension-type headache, unspecified, not intractable: Secondary | ICD-10-CM

## 2023-06-28 NOTE — Patient Instructions (Signed)
Tension Headache, Adult A tension headache is a feeling of pain, pressure, or aching over the front and sides of the head. The pain can be dull, or it can feel tight. There are two types of tension headache: Episodic tension headache. This is when the headaches happen fewer than 15 days a month. Chronic tension headache. This is when the headaches happen more than 15 days a month during a 3-month period. A tension headache can last from 30 minutes to several days. It is the most common kind of headache. Tension headaches are not normally associated with nausea or vomiting, and they do not get worse with physical activity. What are the causes? The exact cause of this condition is not known. Tension headaches are often triggered by stress, anxiety, or depression. Other triggers may include: Alcohol. Too much caffeine or caffeine withdrawal. Respiratory infections, such as colds, flu, or sinus infections. Dental problems or teeth clenching. Fatigue. Holding your head and neck in the same position for a long period of time, such as while using a computer. Smoking. Arthritis of the neck. What are the signs or symptoms? Symptoms of this condition include: A feeling of pressure or tightness around the head. Dull, aching head pain. Pain over the front and sides of the head. Tenderness in the muscles of the head, neck, and shoulders. How is this diagnosed? This condition may be diagnosed based on your symptoms, your medical history, and a physical exam. If your symptoms are severe or unusual, you may have imaging tests, such as a CT scan or an MRI of your head. Your vision may also be checked. How is this treated? This condition may be treated with lifestyle changes and with medicines that help relieve symptoms. Follow these instructions at home: Managing pain Take over-the-counter and prescription medicines only as told by your health care provider. When you have a headache, lie down in a dark,  quiet room. If directed, put ice on your head and neck. To do this: Put ice in a plastic bag. Place a towel between your skin and the bag. Leave the ice on for 20 minutes, 2-3 times a day. Remove the ice if your skin turns bright red. This is very important. If you cannot feel pain, heat, or cold, you have a greater risk of damage to the area. If directed, apply heat to the back of your neck as often as told by your health care provider. Use the heat source that your health care provider recommends, such as a moist heat pack or a heating pad. Place a towel between your skin and the heat source. Leave the heat on for 20-30 minutes. Remove the heat if your skin turns bright red. This is especially important if you are unable to feel pain, heat, or cold. You have a greater risk of getting burned. Eating and drinking Eat meals on a regular schedule. If you drink alcohol: Limit how much you have to: 0-1 drink a day for women who are not pregnant. 0-2 drinks a day for men. Know how much alcohol is in your drink. In the [place] one drink equals one 12 oz bottle of beer (355 mL), one 5 oz glass of wine (148 mL), or one 1 oz glass of hard liquor (44 mL). Drink enough fluid to keep your urine pale yellow. Decrease your caffeine intake, or stop using caffeine. Lifestyle Get 7-9 hours of sleep each night, or get the amount of sleep recommended by your health care provider. At bedtime,   remove computers, phones, and tablets from your room. Find ways to manage your stress. This may include: Exercise. Deep breathing exercises. Yoga. Listening to music. Positive mental imagery. Try to sit up straight and avoid tensing your muscles. Do not use any products that contain nicotine or tobacco. These include cigarettes, chewing tobacco, and vaping devices, such as e-cigarettes. If you need help quitting, ask your health care provider. General instructions  Avoid any headache triggers. Keep a journal to help  find out what may trigger your headaches. For example, write down: What you eat and drink. How much sleep you get. Any change to your diet or medicines. Keep all follow-up visits. This is important. Contact a health care provider if: Your headache does not get better. Your headache comes back. You are sensitive to sounds, light, or smells because of a headache. You have nausea or you vomit. Your stomach hurts. Get help right away if: You suddenly develop a severe headache, along with any of the following: A stiff neck. Nausea and vomiting. Confusion. Weakness in one part or one side of your body. Double vision or loss of vision. Shortness of breath. Rash. Unusual sleepiness. Fever or chills. Trouble speaking. Pain in your eye or ear. Trouble walking or balancing. Feeling faint or passing out. Summary A tension headache is a feeling of pain, pressure, or aching over the front and sides of the head. A tension headache can last from 30 minutes to several days. It is the most common kind of headache. This condition may be diagnosed based on your symptoms, your medical history, and a physical exam. This condition may be treated with lifestyle changes and with medicines that help relieve symptoms. This information is not intended to replace advice given to you by your health care provider. Make sure you discuss any questions you have with your health care provider. Document Revised: 05/09/2020 Document Reviewed: 05/09/2020 Elsevier Patient Education  2024 Elsevier Inc.  

## 2023-06-28 NOTE — Progress Notes (Signed)
Subjective:    Patient ID: Krista Finley, female    DOB: 04/29/1951, 72 y.o.   MRN: [id number]  Chief Complaint  Patient presents with   Medical Management of Chronic Issues   Today's visit was for Transitional Care Management.  The patient was discharged from [facility] on 06/22/23 with a primary diagnosis of weakness.   Contact with the patient and/or caregiver, by a clinical staff member, was made on 06/23/23 and was documented as a telephone encounter within the EMR.  Through chart review and discussion with the patient I have determined that management of their condition is of moderate complexity.   She went with right sided weakness and slurred speech. She had negative Head CT. MRI negative for acute finding, CTA head and neck negative.   Her confusion and stroke like symptoms was thought to be related her amitriptyline increasing her dose to 150 mg from 75 mg.   She has stopped her amitriptyline. She has been taking CBD oil as needed when she feels like she is getting a headache.  Hypertension This is a chronic problem. The current episode started more than 1 year ago. The problem has been resolved since onset. Pertinent negatives include no malaise/fatigue, peripheral edema or shortness of breath. The current treatment provides moderate improvement.  Migraine  This is a chronic problem. The current episode started more than 1 year ago. Episode frequency: weekly. The problem has been gradually worsening. The pain is located in the Temporal region. The pain quality is similar to prior headaches. Pertinent negatives include no nausea, phonophobia, photophobia or vomiting. Nothing aggravates the symptoms. Treatments tried: cbd oil. The treatment provided mild relief. Her past medical history is significant for hypertension and obesity.  Hyperlipidemia This is a chronic problem. The current episode started more than 1 year ago. Exacerbating diseases include obesity. Pertinent  negatives include no shortness of breath. The current treatment provides moderate improvement of lipids.      Review of Systems  Constitutional:  Negative for malaise/fatigue.  Eyes:  Negative for photophobia.  Respiratory:  Negative for shortness of breath.   Gastrointestinal:  Negative for nausea and vomiting.  All other systems reviewed and are negative.  Family History  Problem Relation Age of Onset   Hypertension Mother    Heart disease Mother    Heart disease Father        MI   Parkinson's disease Sister    Parkinson's disease Brother    Stomach cancer Maternal Grandmother    Stomach cancer Maternal Aunt    Colon cancer Neg Hx    Esophageal cancer Neg Hx    Rectal cancer Neg Hx    Breast cancer Neg Hx    Migraines Neg Hx    Social History   Socioeconomic History   Marital status: Single    Spouse name: Not on file   Number of children: Not on file   Years of education: Not on file   Highest education level: High school graduate  Occupational History   Occupation: retired    Associate Professor: [employer]  Tobacco Use   Smoking status: Never   Smokeless tobacco: Never  Vaping Use   Vaping status: Never Used  Substance and Sexual Activity   Alcohol use: Yes    Comment: 1 drink couple times of year   Drug use: No   Sexual activity: Not Currently  Other Topics Concern   Not on file  Social History Narrative   Stays with her  mother and disabled brother   Left handed   Caffeine: tea, maybe 1 or 2 cups/day   Social Determinants of Health   Financial Resource Strain: Low Risk  (12/11/2022)   Overall Financial Resource Strain (CARDIA)    Difficulty of Paying Living Expenses: Not hard at all  Food Insecurity: No Food Insecurity (12/11/2022)   Hunger Vital Sign    Worried About Running Out of Food in the Last Year: Never true    Ran Out of Food in the Last Year: Never true  Transportation Needs: No Transportation Needs (12/11/2022)   PRAPARE - Scientist, research (physical sciences) (Medical): No    Lack of Transportation (Non-Medical): No  Physical Activity: Insufficiently Active (12/11/2022)   Exercise Vital Sign    Days of Exercise per Week: 3 days    Minutes of Exercise per Session: 30 min  Stress: No Stress Concern Present (12/11/2022)   [employer] of Occupational Health - Occupational Stress Questionnaire    Feeling of Stress : Not at all  Social Connections: Moderately Isolated (12/11/2022)   Social Connection and Isolation Panel [NHANES]    Frequency of Communication with Friends and Family: More than three times a week    Frequency of Social Gatherings with Friends and Family: More than three times a week    Attends Religious Services: More than 4 times per year    Active Member of [employer] or Organizations: No    Attends Banker Meetings: Never    Marital Status: Never married       Objective:   Physical Exam Vitals reviewed.  Constitutional:      General: She is not in acute distress.    Appearance: She is well-developed.  HENT:     Head: Normocephalic and atraumatic.     Right Ear: Tympanic membrane normal.     Left Ear: Tympanic membrane normal.  Eyes:     Pupils: Pupils are equal, round, and reactive to light.  Neck:     Thyroid: No thyromegaly.  Cardiovascular:     Rate and Rhythm: Normal rate and regular rhythm.     Heart sounds: Normal heart sounds. No murmur heard. Pulmonary:     Effort: Pulmonary effort is normal. No respiratory distress.     Breath sounds: Normal breath sounds. No wheezing.  Abdominal:     General: Bowel sounds are normal. There is no distension.     Palpations: Abdomen is soft.     Tenderness: There is no abdominal tenderness.  Musculoskeletal:        General: No tenderness. Normal range of motion.     Cervical back: Normal range of motion and neck supple.  Skin:    General: Skin is warm and dry.  Neurological:     Mental Status: She is alert and oriented to person, place, and  time.     Cranial Nerves: No cranial nerve deficit.     Deep Tendon Reflexes: Reflexes are normal and symmetric.  Psychiatric:        Behavior: Behavior normal.        Thought Content: Thought content normal.        Judgment: Judgment normal.       BP 129/74   Pulse 70   Temp (!) 97.3 F (36.3 C) (Temporal)   Ht 5\' 1"  (1.549 m)   Wt 156 lb 6.4 oz (70.9 kg)   SpO2 98%   BMI 29.55 kg/m      Assessment &  Plan:  Krista Finley comes in today with chief complaint of Medical Management of Chronic Issues   Diagnosis and orders addressed:  1. Essential hypertension - CBC with Differential/Platelet - CMP14+EGFR  2. Hyperlipidemia, unspecified hyperlipidemia type - CBC with Differential/Platelet - CMP14+EGFR  3. Stroke-like symptoms - CBC with Differential/Platelet - CMP14+EGFR  4. Migraine with aura and without status migrainosus, not intractable - CBC with Differential/Platelet - CMP14+EGFR  5. Tension headache - CBC with Differential/Platelet - CMP14+EGFR  6. Hospital discharge follow-up - CBC with Differential/Platelet - CMP14+EGFR   Labs pending Will continue to hold amitriptyline, will hold gabapentin as needed CBD oil as needed  Health Maintenance reviewed Diet and exercise encouraged  Follow up plan: Keep chronic follow up   Jannifer Rodney, FNP

## 2023-06-29 ENCOUNTER — Other Ambulatory Visit: Payer: Self-pay | Admitting: Family

## 2023-06-29 LAB — CMP14+EGFR
ALT: 7 [IU]/L (ref 0–32)
AST: 17 [IU]/L (ref 0–40)
Albumin: 4.1 g/dL (ref 3.9–4.9)
Alkaline Phosphatase: 118 [IU]/L (ref 44–121)
BUN/Creatinine Ratio: 8 — ABNORMAL LOW (ref 12–28)
BUN: 12 mg/dL (ref 8–27)
Bilirubin Total: 0.3 mg/dL (ref 0.0–1.2)
CO2: 25 mmol/L (ref 20–29)
Calcium: 10.1 mg/dL (ref 8.7–10.3)
Chloride: 104 mmol/L (ref 96–106)
Creatinine, Ser: 1.46 mg/dL — ABNORMAL HIGH (ref 0.57–1.00)
Globulin, Total: 2.5 g/dL (ref 1.5–4.5)
Glucose: 87 mg/dL (ref 70–99)
Potassium: 5.3 mmol/L — ABNORMAL HIGH (ref 3.5–5.2)
Sodium: 142 mmol/L (ref 134–144)
Total Protein: 6.6 g/dL (ref 6.0–8.5)
eGFR: 38 mL/min/{1.73_m2} — ABNORMAL LOW (ref >59)

## 2023-06-29 LAB — CBC WITH DIFFERENTIAL/PLATELET
Basophils Absolute: 0 10*3/uL (ref 0.0–0.2)
Basos: 1 %
EOS (ABSOLUTE): 0.3 10*3/uL (ref 0.0–0.4)
Eos: 5 %
Hematocrit: 36.1 % (ref 34.0–46.6)
Hemoglobin: 11.5 g/dL (ref 11.1–15.9)
Immature Grans (Abs): 0 10*3/uL (ref 0.0–0.1)
Immature Granulocytes: 0 %
Lymphocytes Absolute: 1.7 10*3/uL (ref 0.7–3.1)
Lymphs: 30 %
MCH: 29.3 pg (ref 26.6–33.0)
MCHC: 31.9 g/dL (ref 31.5–35.7)
MCV: 92 fL (ref 79–97)
Monocytes Absolute: 0.5 10*3/uL (ref 0.1–0.9)
Monocytes: 8 %
Neutrophils Absolute: 3.1 10*3/uL (ref 1.4–7.0)
Neutrophils: 56 %
Platelets: 301 10*3/uL (ref 150–450)
RBC: 3.93 x10E6/uL (ref 3.77–5.28)
RDW: 12.7 % (ref 11.7–15.4)
WBC: 5.6 10*3/uL (ref 3.4–10.8)

## 2023-07-11 ENCOUNTER — Other Ambulatory Visit: Payer: Self-pay | Admitting: Family

## 2023-07-12 NOTE — Progress Notes (Deleted)
[facility] [address] [place]. [place] [address] [phone]       OFFICE FOLLOW UP NOTE  Ms. CORNETTA KEISTER Date of Birth:  05/15/1951 Medical Record Number:  [id number]   Primary neurologist: Dr. Frances Furbish Reason for visit: Chronic headache    SUBJECTIVE:   CHIEF COMPLAINT:  No chief complaint on file.   Follow-up visit:  Prior visit: 12/29/2022 with Dr. Frances Furbish  Brief HPI: Krista Finley is a 72 y.o. female with history of left BG infarct who was seen by Dr. Frances Furbish for recurrent headaches that started around 10/2021, occurring nearly daily primarily frontal area, throbbing in nature not associated with nausea/vomiting or photophobia.  Can frequently wake with headache.  She was previously followed by Dr. Gerilyn Pilgrim and has previously tried propranolol and topiramate as well as sumatriptan.  Sleep study 04/2022 no significant apnea noted.  At prior visit, amitriptyline dosage increased to 75 mg nightly and discontinued gabapentin 100 mg as no noticeable benefit   Interval history:  Returns today for scheduled follow-up visit.  She presented to [facility] ED on 10/28 with complaints of slurred speech, right-sided weakness, mild confusion and difficulty walking and family reporting 3 days prior she had episode of visual hallucinations. CTH,  CTA and MRI brain negative for acute findings and symptoms resolved.  Evaluated by teleneurology, suspected symptoms from amitriptyline dosage being doubled from 75 mg to 150 mg by PCP 4 days prior therefore amitriptyline discontinued.  She is not currently on headache prophylactic therapy.   Reports no significant improvement after starting amitriptyline.  Has been experience about 3-4 headaches per week but worsened over the past week to daily headache. Still present over frontal/bilateral temporal area with throbbing sensation not associated with photophobia, phonophobia or N/V.  Denies any OTC overuse.  Blood pressure  elevated today, occasionally monitors at home and typically stable.      ROS:   14 system review of systems performed and negative with exception of those listed in HPI  PMH:  Past Medical History:  Diagnosis Date   CVA (cerebral vascular accident) (HCC)    Gastritis    Headache    Hyperlipidemia    Hypertension    Migraine     PSH:  Past Surgical History:  Procedure Laterality Date   ELBOW SURGERY Right     Social History:  Social History   Socioeconomic History   Marital status: Single    Spouse name: Not on file   Number of children: Not on file   Years of education: Not on file   Highest education level: High school graduate  Occupational History   Occupation: retired    Associate Professor: [employer]  Tobacco Use   Smoking status: Never   Smokeless tobacco: Never  Vaping Use   Vaping status: Never Used  Substance and Sexual Activity   Alcohol use: Yes    Comment: 1 drink couple times of year   Drug use: No   Sexual activity: Not Currently  Other Topics Concern   Not on file  Social History Narrative   Stays with her mother and disabled brother   Left handed   Caffeine: tea, maybe 1 or 2 cups/day   Social Determinants of Health   Financial Resource Strain: Low Risk  (12/11/2022)   Overall Financial Resource Strain (CARDIA)    Difficulty of Paying Living Expenses: Not hard at all  Food Insecurity: No Food Insecurity (12/11/2022)   Hunger Vital Sign    Worried  About Running Out of Food in the Last Year: Never true    Ran Out of Food in the Last Year: Never true  Transportation Needs: No Transportation Needs (12/11/2022)   PRAPARE - Administrator, Civil Service (Medical): No    Lack of Transportation (Non-Medical): No  Physical Activity: Insufficiently Active (12/11/2022)   Exercise Vital Sign    Days of Exercise per Week: 3 days    Minutes of Exercise per Session: 30 min  Stress: No Stress Concern Present (12/11/2022)   [employer] of  Occupational Health - Occupational Stress Questionnaire    Feeling of Stress : Not at all  Social Connections: Moderately Isolated (12/11/2022)   Social Connection and Isolation Panel [NHANES]    Frequency of Communication with Friends and Family: More than three times a week    Frequency of Social Gatherings with Friends and Family: More than three times a week    Attends Religious Services: More than 4 times per year    Active Member of [employer] or Organizations: No    Attends Banker Meetings: Never    Marital Status: Never married  Intimate Partner Violence: Not At Risk (12/11/2022)   Humiliation, Afraid, Rape, and Kick questionnaire    Fear of Current or Ex-Partner: No    Emotionally Abused: No    Physically Abused: No    Sexually Abused: No    Family History:  Family History  Problem Relation Age of Onset   Hypertension Mother    Heart disease Mother    Heart disease Father        MI   Parkinson's disease Sister    Parkinson's disease Brother    Stomach cancer Maternal Grandmother    Stomach cancer Maternal Aunt    Colon cancer Neg Hx    Esophageal cancer Neg Hx    Rectal cancer Neg Hx    Breast cancer Neg Hx    Migraines Neg Hx     Medications:   Current Outpatient Medications on File Prior to Visit  Medication Sig Dispense Refill   amLODipine (NORVASC) 5 MG tablet Take 1 tablet (5 mg total) by mouth daily. **NEEDS TO BE SEEN BEFORE NEXT REFILL** 90 tablet 1   Apoaequorin (PREVAGEN) 10 MG CAPS Take by mouth.     aspirin EC (ASPIRIN LOW DOSE) 81 MG tablet Take 1 tablet by mouth once daily 90 tablet 1   atorvastatin (LIPITOR) 40 MG tablet Take 1 tablet (40 mg total) by mouth daily. 90 tablet 1   gabapentin (NEURONTIN) 100 MG capsule Take 100 mg by mouth at bedtime.     metoprolol tartrate (LOPRESSOR) 25 MG tablet Take 1 tablet (25 mg total) by mouth 2 (two) times daily. 180 tablet 1   No current facility-administered medications on file prior to visit.     Allergies:  No Known Allergies    OBJECTIVE:  Physical Exam  There were no vitals filed for this visit.  There is no height or weight on file to calculate BMI. No results found.   General: well developed, well nourished, very pleasant middle-aged African-American female, seated, in no evident distress Head: head normocephalic and atraumatic.   Neck: supple with no carotid or supraclavicular bruits Cardiovascular: regular rate and rhythm, no murmurs Musculoskeletal: no deformity Skin:  no rash/petichiae Vascular:  Normal pulses all extremities   Neurologic Exam Mental Status: Awake and fully alert. Oriented to place and time. Recent and remote memory intact. Attention span, concentration and  fund of knowledge appropriate. Mood and affect appropriate.  Cranial Nerves: Pupils equal, briskly reactive to light. Extraocular movements full without nystagmus. Visual fields full to confrontation. Hearing intact. Facial sensation intact. Face, tongue, palate moves normally and symmetrically.  Motor: Normal bulk and tone. Normal strength in all tested extremity muscles Sensory.: intact to touch , pinprick , position and vibratory sensation.  Coordination: Rapid alternating movements normal in all extremities. Finger-to-nose and heel-to-shin performed accurately bilaterally. Gait and Station: Arises from chair without difficulty. Stance is normal. Gait demonstrates normal stride length and balance without use of AD.  Reflexes: 1+ and symmetric. Toes downgoing.         ASSESSMENT/PLAN: Krista Finley is a 72 y.o. year old female with recurrent migraine headaches worsening around 10/2021.  Patient of Dr. Frances Furbish. 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  Completed sleep study 04/2022 which did not show evidence of sleep apnea therefore started on amitriptyline for headache prevention.  Continues to experience about 3-4 migraines per  week although over the past week has been experiencing daily.    Recommend increasing amitriptyline from 25 mg to 50 mg nightly, if no benefit over the next 3 to 4 weeks advised patient to call office to discuss other treatment options or consider dosage increase.  She would like to avoid injectable medication or Botox if able.  Previously tried/failed: Topiramate and propranolol      Follow up in 3 months or call earlier if needed   CC:  PCP: Junie Spencer, FNP    I spent 21 minutes of face-to-face and non-face-to-face time with patient.  This included previsit chart review, lab review, study review, order entry, electronic health record documentation, patient education regarding and discussion regarding the above and answered all the questions to patient's satisfaction   Ihor Austin, [facility]  [facility] [address] [place], [place] [address]  Phone [phone] Fax [phone] Note: This document was prepared with digital dictation and possible smart phrase technology. Any transcriptional errors that result from this process are unintentional.

## 2023-07-13 ENCOUNTER — Encounter: Payer: Self-pay | Admitting: Adult Health

## 2023-07-13 ENCOUNTER — Ambulatory Visit: Payer: Medicare Other | Admitting: Adult Health

## 2023-11-03 ENCOUNTER — Encounter: Payer: Self-pay | Admitting: Nurse Practitioner

## 2023-11-03 ENCOUNTER — Ambulatory Visit: Admitting: Nurse Practitioner

## 2023-11-03 ENCOUNTER — Ambulatory Visit: Payer: Self-pay | Admitting: Family

## 2023-11-03 VITALS — BP 130/79 | HR 80 | Temp 97.2°F | Ht 61.0 in | Wt 155.8 lb

## 2023-11-03 DIAGNOSIS — R051 Acute cough: Secondary | ICD-10-CM | POA: Diagnosis not present

## 2023-11-03 MED ORDER — GUAIFENESIN 400 MG PO TABS: 400.0000 mg | ORAL_TABLET | Freq: Four times a day (QID) | ORAL | 0 refills | Status: AC | PRN

## 2023-11-03 NOTE — Telephone Encounter (Addendum)
 Copied from CRM [id number]. Topic: Clinical - Red Word Triage >> Nov 03, 2023  8:06 AM Gildardo Pounds wrote: Red Word that prompted transfer to Nurse Triage: [facility] or [facility] on Monday, rib cage and stomach sore from coughing. Benzonatate 100 mg cough medicine. callback number is [phone]   Chief Complaint: Cough Frequency: Ongoing since Sunday Pertinent Negatives: Patient denies SOB, fever Disposition: [] ED /[] Urgent Care (no appt availability in office) / [x] Appointment(In office/virtual)/ []  [facility]/ [] Home Care/ [] Refused Recommended Disposition /[] [place]/ []  Follow-up with PCP Additional Notes: Patient has been having a bad cough since Sunday. She went to Urgent Care on Monday and was prescribed a cough medicine which she has been taking 3 times a day. Patient stated there has been no improvement, and she has actually been feeling worse. Appointment scheduled for this morning.  Reason for Disposition  [1] Continuous (nonstop) coughing interferes with work or school AND [2] no improvement using cough treatment per Care Advice  Answer Assessment - Initial Assessment Questions 1. ONSET: "When did the cough begin?"      Sunday  2. SEVERITY: "How bad is the cough today?"      Bad enough to cause rib and stomach pain   3. SPUTUM: "Describe the color of your sputum" (none, dry cough; clear, white, yellow, green)     N/A  4. DIFFICULTY BREATHING: "Are you having difficulty breathing?" If Yes, ask: "How bad is it?" (e.g., mild, moderate, severe)      No  5. FEVER: "Do you have a fever?" If Yes, ask: "What is your temperature, how was it measured, and when did it start?"     No  Protocols used: Cough - Acute Non-Productive-A-AH

## 2023-11-03 NOTE — Telephone Encounter (Signed)
 Appt today

## 2023-11-03 NOTE — Progress Notes (Signed)
 Acute Office Visit  Subjective:     Patient ID: Krista Finley, female    DOB: 08/04/1951, 73 y.o.   MRN: [id number]  Chief Complaint  Patient presents with   Cough    Cough started over weekend Went to urgent care Monday for cough was given medicine but not helping    HPI Krista Finley 73 year old female present November 03, 2023 complaining of worsening cough She was seen as an urgent care on November 01, 2023, she was negative flu daily and COVID and prescribed Z-Pak and Tessalon Perles.  Client reports that she only pick up Tessalon Perles at the pharmacy and reports that it is not seems like it is helping.  Report productive cough " yellow sputum".  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  HENT:  Negative for congestion and sore throat.   Respiratory:  Positive for cough and sputum production.        Yellow  Cardiovascular:  Negative for chest pain and leg swelling.  Gastrointestinal:  Negative for constipation, diarrhea, nausea and vomiting.   Neurological:  Negative for dizziness and headaches.   Negative unless indicated in HPI    Objective:    BP 130/79   Pulse 80   Temp (!) 97.2 F (36.2 C) (Temporal)   Ht 5\' 1"  (1.549 m)   Wt 155 lb 12.8 oz (70.7 kg)   SpO2 100%   BMI 29.44 kg/m  BP Readings from Last 3 Encounters:  11/03/23 130/79  06/28/23 129/74  06/22/23 (!) 145/98   Wt Readings from Last 3 Encounters:  11/03/23 155 lb 12.8 oz (70.7 kg)  06/28/23 156 lb 6.4 oz (70.9 kg)  06/21/23 170 lb (77.1 kg)      Physical Exam Vitals and nursing note reviewed.  Constitutional:      General: She is not in acute distress. HENT:     Head: Normocephalic and atraumatic.     Nose: Nose normal. No congestion.     Mouth/Throat:     Mouth: Mucous membranes are moist.  Eyes:     General: No scleral icterus.    Extraocular Movements: Extraocular movements intact.     Conjunctiva/sclera: Conjunctivae normal.     Pupils: Pupils are equal, round, and reactive to light.  Cardiovascular:     Heart sounds: Normal heart sounds.  Pulmonary:     Effort: Pulmonary effort is normal.     Breath sounds: Normal breath sounds.  Musculoskeletal:        General: Normal range  of motion.     Right lower leg: No edema.     Left lower leg: No edema.  Skin:    General: Skin is warm and dry.     Findings: No rash.  Neurological:     Mental Status: She is alert and oriented to person, place, and time.  Psychiatric:        Mood and Affect: Mood normal.        Behavior: Behavior normal.        Thought Content: Thought content normal.        Judgment: Judgment normal.     No results found for any visits on 11/03/23.      Assessment & Plan:  Acute cough -     guaiFENesin; Take 1 tablet (400 mg total) by mouth every 6 (six) hours as needed.  Dispense: 30 tablet; Refill: 0  Krista Finley is a 48-year-old African-American female seen today for cough, no acute distress Cough: guaifenesin, 400 mg every 6 hours as needed, client  instructed to take each pill with at least a full glass of water  She is advised to go to the pharmacy and pick up Zithromax that was prescribed during her visit to the urgent care and to take it as prescribed Okay to continue taking Tessalon Perles while taking guaifenesin All question answered Call the clinic with any question or concerns   The above assessment and management plan was discussed with the patient. The patient verbalized understanding of and has agreed to the management plan. Patient is aware to call the clinic if they develop any new symptoms or if symptoms persist or worsen. Patient is aware when to return to the clinic for a follow-up visit. Patient educated on when it is appropriate to go to the emergency department.  Return if symptoms worsen or fail to improve.  Arrie Aran Santa Lighter, [facility] [address] [place], [place] [address] [phone]  Note: This document was prepared by Reubin Milan voice dictation technology and any errors that results from this process are unintentional.

## 2023-12-15 ENCOUNTER — Ambulatory Visit: Payer: Medicare Other

## 2023-12-15 VITALS — BP 130/79 | HR 80 | Ht 61.0 in | Wt 155.0 lb

## 2023-12-15 DIAGNOSIS — Z0000 Encounter for general adult medical examination without abnormal findings: Secondary | ICD-10-CM

## 2023-12-15 NOTE — Progress Notes (Signed)
 Subjective:   Dannia J Gabor is a 73 y.o. who presents for a Medicare Wellness preventive visit.  Visit Complete: Virtual I connected with  Destini J Demarco on 12/15/23 by a audio enabled telemedicine application and verified that I am speaking with the correct person using two identifiers.  Patient Location: Home  Provider Location: Home Office  I discussed the limitations of evaluation and management by telemedicine. The patient expressed understanding and agreed to proceed.  Vital Signs: Because this visit was a virtual/telehealth visit, some criteria may be missing or patient reported. Any vitals not documented were not able to be obtained and vitals that have been documented are patient reported.  VideoDeclined- This patient declined Librarian, academic. Therefore the visit was completed with audio only.  Persons Participating in Visit: Patient.  AWV Questionnaire: No: Patient Medicare AWV questionnaire was not completed prior to this visit.  Cardiac Risk Factors include: advanced age (>33men, >62 women);hypertension;sedentary lifestyle     Objective:    Today's Vitals   12/15/23 0832  BP: 130/79  Pulse: 80  Weight: 155 lb (70.3 kg)  Height: 5\' 1"  (1.549 m)   Body mass index is 29.29 kg/m.     12/15/2023    8:22 AM 06/21/2023    9:14 PM 06/21/2023    3:46 PM 02/23/2023   10:29 AM 12/11/2022    9:20 AM 12/09/2021    2:27 PM 10/13/2021   11:21 AM  Advanced Directives  Does Patient Have a Medical Advance Directive? No No No No No No No  Would patient like information on creating a medical advance directive?  No - Patient declined No - Patient declined  No - Patient declined No - Patient declined No - Patient declined    Current Medications (verified) Outpatient Encounter Medications as of 12/15/2023  Medication Sig   amLODipine  (NORVASC ) 5 MG tablet Take 1 tablet (5 mg total) by mouth daily. **NEEDS TO BE SEEN BEFORE NEXT REFILL**    Apoaequorin (PREVAGEN) 10 MG CAPS Take by mouth.   aspirin  EC (ASPIRIN  LOW DOSE) 81 MG tablet Take 1 tablet by mouth once daily   atorvastatin  (LIPITOR) 40 MG tablet Take 1 tablet (40 mg total) by mouth daily.   gabapentin  (NEURONTIN ) 100 MG capsule Take 100 mg by mouth at bedtime.   guaifenesin  (HUMIBID E) 400 MG TABS tablet Take 1 tablet (400 mg total) by mouth every 6 (six) hours as needed.   metoprolol  tartrate (LOPRESSOR ) 25 MG tablet Take 1 tablet (25 mg total) by mouth 2 (two) times daily.   No facility-administered encounter medications on file as of 12/15/2023.    Allergies (verified) Patient has no known allergies.   History: Past Medical History:  Diagnosis Date   CVA (cerebral vascular accident) (HCC)    Gastritis    Headache    Hyperlipidemia    Hypertension    Migraine    Past Surgical History:  Procedure Laterality Date   ELBOW SURGERY Right    Family History  Problem Relation Age of Onset   Hypertension Mother    Heart disease Mother    Heart disease Father        MI   Parkinson's disease Sister    Parkinson's disease Brother    Stomach cancer Maternal Grandmother    Stomach cancer Maternal Aunt    Colon cancer Neg Hx    Esophageal cancer Neg Hx    Rectal cancer Neg Hx    Breast cancer Neg Hx  Migraines Neg Hx    Social History   Socioeconomic History   Marital status: Single    Spouse name: Not on file   Number of children: Not on file   Years of education: Not on file   Highest education level: High school graduate  Occupational History   Occupation: retired    Associate Professor: [employer]  Tobacco Use   Smoking status: Never   Smokeless tobacco: Never  Vaping Use   Vaping status: Never Used  Substance and Sexual Activity   Alcohol use: Yes    Comment: 1 drink couple times of year   Drug use: No   Sexual activity: Not Currently  Other Topics Concern   Not on file  Social History Narrative   Stays with her mother and disabled brother   Left  handed   Caffeine: tea, maybe 1 or 2 cups/day   Social Drivers of Corporate investment banker Strain: Low Risk  (12/15/2023)   Overall Financial Resource Strain (CARDIA)    Difficulty of Paying Living Expenses: Not hard at all  Food Insecurity: No Food Insecurity (12/15/2023)   Hunger Vital Sign    Worried About Running Out of Food in the Last Year: Never true    Ran Out of Food in the Last Year: Never true  Transportation Needs: No Transportation Needs (12/15/2023)   PRAPARE - Administrator, Civil Service (Medical): No    Lack of Transportation (Non-Medical): No  Physical Activity: Unknown (12/15/2023)   Exercise Vital Sign    Days of Exercise per Week: Patient declined    Minutes of Exercise per Session: Not on file  Stress: No Stress Concern Present (12/15/2023)   [employer] of Occupational Health - Occupational Stress Questionnaire    Feeling of Stress : Not at all  Social Connections: Moderately Integrated (12/15/2023)   Social Connection and Isolation Panel [NHANES]    Frequency of Communication with Friends and Family: More than three times a week    Frequency of Social Gatherings with Friends and Family: More than three times a week    Attends Religious Services: More than 4 times per year    Active Member of [employer] or Organizations: Yes    Attends Banker Meetings: Never    Marital Status: Never married    Tobacco Counseling Counseling given: Yes    Clinical Intake:  Pre-visit preparation completed: Yes  Pain : No/denies pain     BMI - recorded: 29.29 Nutritional Status: BMI 25 -29 Overweight Nutritional Risks: None Diabetes: No  Lab Results  Component Value Date   HGBA1C 4.8 06/21/2023   HGBA1C 5.5 04/26/2015     How often do you need to have someone help you when you read instructions, pamphlets, or other written materials from your doctor or pharmacy?: 1 - Never  Interpreter Needed?: No  Information entered by :: Alia  t/cma   Activities of Daily Living     12/15/2023    8:20 AM 06/21/2023    9:14 PM  In your present state of health, do you have any difficulty performing the following activities:  Hearing? 0 0  Vision? 0 0  Difficulty concentrating or making decisions? 0 0  Walking or climbing stairs? 0   Dressing or bathing? 0   Doing errands, shopping? 0 0  Preparing Food and eating ? N   Using the Toilet? N   In the past six months, have you accidently leaked urine? N  Do you have problems with loss of bowel control? N   Managing your Medications? N   Managing your Finances? N   Housekeeping or managing your Housekeeping? N     Patient Care Team: Yevette Hem, FNP as PCP - General (Family Medicine) Jeneen Mire, MD as Consulting Physician (Neurology) Alexia Idler, [place] (Optometry) Kenney Peacemaker, MD as Consulting Physician (Gastroenterology)  Indicate any recent Medical Services you may have received from other than [facility] providers in the past year (date may be approximate).     Assessment:   This is a routine wellness examination for [place].  Hearing/Vision screen Hearing Screening - Comments:: Pt denies hearing dif Vision Screening - Comments:: Pt denies vision dif Pt goes to [facility] in [place], [place]   Goals Addressed             This Visit's Progress    DIET - INCREASE WATER INTAKE   On track    Recommend 6-8 glasses per day     Patient Stated   On track    She would like to lose a little weight       Depression Screen     12/15/2023    8:26 AM 06/28/2023   12:00 PM 06/17/2023   11:33 AM 03/22/2023   11:55 AM 12/11/2022    9:19 AM 08/28/2022    9:33 AM 03/09/2022   10:05 AM  PHQ 2/9 Scores  PHQ - 2 Score 0 0 0 0 0 0 0  PHQ- 9 Score 0 0 0 0  0 0    Fall Risk     12/15/2023    8:18 AM 06/17/2023   11:33 AM 03/22/2023   11:55 AM 12/11/2022    9:18 AM 08/28/2022    9:33 AM  Fall Risk   Falls in the past year? 0 0 0 0 0  Number falls in past yr: 0 0 0 0    Injury with Fall? 0 0 0 0   Risk for fall due to : No Fall Risks Orthopedic patient No Fall Risks No Fall Risks   Follow up Falls prevention discussed;Falls evaluation completed Falls evaluation completed;Education provided Falls evaluation completed Falls prevention discussed     MEDICARE RISK AT HOME:  Medicare Risk at Home Any stairs in or around the home?: Yes If so, are there any without handrails?: Yes Home free of loose throw rugs in walkways, pet beds, electrical cords, etc?: Yes Adequate lighting in your home to reduce risk of falls?: Yes Life alert?: No Use of a cane, walker or w/c?: No Grab bars in the bathroom?: Yes Shower chair or bench in shower?: No Elevated toilet seat or a handicapped toilet?: No  TIMED UP AND GO:  Was the test performed?  No  Cognitive Function: 6CIT completed    08/28/2022    9:39 AM 08/28/2022    9:38 AM  MMSE - Mini Mental State Exam  Orientation to time  4  Orientation to Place  5  Registration  3  Attention/ Calculation  5  Recall  3  Language- name 2 objects  2  Language- repeat  1  Language- follow 3 step command  3  Language- read & follow direction  1  Write a sentence  1  Copy design 1         12/15/2023    8:27 AM 12/11/2022    9:20 AM 12/09/2021    2:25 PM 11/27/2020    1:35 PM  6CIT  Screen  What Year? 0 points 0 points 0 points 0 points  What month? 0 points 0 points 0 points 0 points  What time? 0 points 0 points 0 points 0 points  Count back from 20 0 points 0 points 0 points 0 points  Months in reverse 0 points 0 points 2 points 0 points  Repeat phrase 2 points 0 points 4 points 0 points  Total Score 2 points 0 points 6 points 0 points    Immunizations Immunization History  Administered Date(s) Administered   PFIZER(Purple Top)SARS-COV-2 Vaccination 11/16/2019, 12/09/2019, 08/23/2020   PPD Test 04/11/2021   Tdap 09/09/2017   Zoster, Live 04/20/2013    Screening Tests Health Maintenance  Topic Date Due    Pneumonia Vaccine 61+ Years old (1 of 1 - PCV) Never done   COVID-19 Vaccine (4 - 2024-25 season) 12/30/2024 (Originally 04/25/2023)   Zoster Vaccines- Shingrix (1 of 2) 03/15/2025 (Originally 06/25/2003)   MAMMOGRAM  01/20/2024   INFLUENZA VACCINE  03/24/2024   Medicare Annual Wellness (AWV)  12/14/2024   DEXA SCAN  12/20/2024   DTaP/Tdap/Td (2 - Td or Tdap) 09/10/2027   Colonoscopy  05/14/2029   Hepatitis C Screening  Completed   HPV VACCINES  Aged Out   Meningococcal B Vaccine  Aged Out    Health Maintenance  Health Maintenance Due  Topic Date Due   Pneumonia Vaccine 39+ Years old (1 of 1 - PCV) Never done   Health Maintenance Items Addressed: See Nurse Notes  Additional Screening:  Vision Screening: Recommended annual ophthalmology exams for early detection of glaucoma and other disorders of the eye.  Dental Screening: Recommended annual dental exams for proper oral hygiene  Community Resource Referral / Chronic Care Management: CRR required this visit?  No   CCM required this visit?  No     Plan:     I have personally reviewed and noted the following in the patient's chart:   Medical and social history Use of alcohol, tobacco or illicit drugs  Current medications and supplements including opioid prescriptions. Patient is not currently taking opioid prescriptions. Functional ability and status Nutritional status Physical activity Advanced directives List of other physicians Hospitalizations, surgeries, and ER visits in previous 12 months Vitals Screenings to include cognitive, depression, and falls Referrals and appointments  In addition, I have reviewed and discussed with patient certain preventive protocols, quality metrics, and best practice recommendations. A written personalized care plan for preventive services as well as general preventive health recommendations were provided to patient.     Michaelle Adolphus, CMA   12/15/2023   After Visit Summary:  (Declined) Due to this being a telephonic visit, with patients personalized plan was offered to patient but patient Declined AVS at this time   Notes: Nothing significant to report at this time.

## 2023-12-15 NOTE — Patient Instructions (Signed)
 [name] , Thank you for taking time to come for your Medicare Wellness Visit. I appreciate your ongoing commitment to your health goals. Please review the following plan we discussed and let me know if I can assist you in the future.   Referrals/Orders/Follow-Ups/Clinician Recommendations: n/a  This is a list of the screening recommended for you and due dates:  Health Maintenance  Topic Date Due   Pneumonia Vaccine (1 of 1 - PCV) Never done   COVID-19 Vaccine (4 - 2024-25 season) 12/30/2024*   Zoster (Shingles) Vaccine (1 of 2) 03/15/2025*   Mammogram  01/20/2024   Flu Shot  03/24/2024   Medicare Annual Wellness Visit  12/14/2024   DEXA scan (bone density measurement)  12/20/2024   DTaP/Tdap/Td vaccine (2 - Td or Tdap) 09/10/2027   Colon Cancer Screening  05/14/2029   Hepatitis C Screening  Completed   HPV Vaccine  Aged Out   Meningitis B Vaccine  Aged Out  *Topic was postponed. The date shown is not the original due date.    Advanced directives: (Declined) Advance directive discussed with you today. Even though you declined this today, please call our office should you change your mind, and we can give you the proper paperwork for you to fill out.  Next Medicare Annual Wellness Visit scheduled for next year: Yes

## 2024-01-18 ENCOUNTER — Other Ambulatory Visit: Payer: Self-pay | Admitting: Family

## 2024-01-18 NOTE — Telephone Encounter (Unsigned)
 Copied from CRM [id number]. Topic: Clinical - Medication Refill >> Jan 18, 2024 11:57 AM Blair Bumpers wrote: Medication: atorvastatin  (LIPITOR) 40 MG tablet  Has the patient contacted their pharmacy? No (Agent: If no, request that the patient contact the pharmacy for the refill. If patient does not wish to contact the pharmacy document the reason why and proceed with request.) (Agent: If yes, when and what did the pharmacy advise?)  This is the patient's preferred pharmacy:  [facility] - [place], [place] - [address] [place] [address] Phone: [phone] Fax: [phone]  Is this the correct pharmacy for this prescription? Yes If no, delete pharmacy and type the correct one.   Has the prescription been filled recently? Yes  Is the patient out of the medication? Yes, states she has been out since last Wednesday.  Has the patient been seen for an appointment in the last year OR does the patient have an upcoming appointment? Yes  Can we respond through MyChart? No  Agent: Please be advised that Rx refills may take up to 3 business days. We ask that you follow-up with your pharmacy.

## 2024-01-19 MED ORDER — ATORVASTATIN CALCIUM 40 MG PO TABS: 40.0000 mg | ORAL_TABLET | Freq: Every day | ORAL | 1 refills | Status: DC

## 2024-01-24 ENCOUNTER — Telehealth: Payer: Self-pay | Admitting: Family

## 2024-01-24 NOTE — Telephone Encounter (Signed)
 Copied from CRM [id number]. Topic: Clinical - Medication Refill >> Jan 24, 2024 10:07 AM Bearl Botts E wrote: Medication: atorvastatin  (LIPITOR) 40 MG tablet  Has the patient contacted their pharmacy? Yes (Agent: If no, request that the patient contact the pharmacy for the refill. If patient does not wish to contact the pharmacy document the reason why and proceed with request.) (Agent: If yes, when and what did the pharmacy advise?)  This is the patient's preferred pharmacy:  [facility] - [place], [place] - [address] [place] [address] Phone: [phone] Fax: [phone]  Is this the correct pharmacy for this prescription? Yes If no, delete pharmacy and type the correct one.   Has the prescription been filled recently? Yes  Is the patient out of the medication? Yes  Has the patient been seen for an appointment in the last year OR does the patient have an upcoming appointment? Yes  Can we respond through MyChart? Yes  Agent: Please be advised that Rx refills may take up to 3 business days. We ask that you follow-up with your pharmacy.

## 2024-01-24 NOTE — Telephone Encounter (Signed)
 Sent to pharmacy on 01/19/24

## 2024-02-14 ENCOUNTER — Other Ambulatory Visit: Payer: Self-pay | Admitting: Family

## 2024-02-14 DIAGNOSIS — I10 Essential (primary) hypertension: Secondary | ICD-10-CM

## 2024-02-21 ENCOUNTER — Other Ambulatory Visit: Payer: Self-pay | Admitting: Family

## 2024-02-21 ENCOUNTER — Telehealth: Payer: Self-pay | Admitting: Family

## 2024-02-21 DIAGNOSIS — I10 Essential (primary) hypertension: Secondary | ICD-10-CM

## 2024-02-21 NOTE — Telephone Encounter (Signed)
 Copied from CRM [id number]. Topic: Clinical - Medication Refill >> Feb 21, 2024 10:32 AM Powell HERO wrote: Medication: aspirin  EC (ASPIRIN  LOW DOSE) 81 MG tablet   Has the patient contacted their pharmacy? Yes   This is the patient's preferred pharmacy:  [facility] - [place], [place] - [address] [place] [address] Phone: [phone] Fax: [phone]  Is this the correct pharmacy for this prescription? Yes If no, delete pharmacy and type the correct one.   Has the prescription been filled recently? Yes  Is the patient out of the medication? Yes  Has the patient been seen for an appointment in the last year OR does the patient have an upcoming appointment? Yes  Can we respond through MyChart? Yes  Agent: Please be advised that Rx refills may take up to 3 business days. We ask that you follow-up with your pharmacy.

## 2024-02-21 NOTE — Telephone Encounter (Signed)
 Copied from CRM [id number]. Topic: Clinical - Medication Refill >> Feb 21, 2024 10:23 AM Montie POUR wrote: Medication: amLODipine  (NORVASC ) 5 MG tablet She is out of medications and wants to see if FNP Lavell will call her in enough to do her until her appointment on July 7.   Has the patient contacted their pharmacy? Yes (Agent: If no, request that the patient contact the pharmacy for the refill. If patient does not wish to contact the pharmacy document the reason why and proceed with request.) (Agent: If yes, when and what did the pharmacy advise?)Pharmacy order to refill  This is the patient's preferred pharmacy:  [facility] - [place], [place] - [address] [place] [address] Phone: [phone] Fax: [phone]  Is this the correct pharmacy for this prescription? Yes If no, delete pharmacy and type the correct one.   Has the prescription been filled recently? No  Is the patient out of the medication? Yes  Has the patient been seen for an appointment in the last year OR does the patient have an upcoming appointment? Yes - Next available appointment is February 28, 2024 at 8:40 AM   Can we respond through MyChart? No  Agent: Please be advised that Rx refills may take up to 3 business days. We ask that you follow-up with your pharmacy.

## 2024-02-28 ENCOUNTER — Ambulatory Visit: Admitting: Family

## 2024-03-01 ENCOUNTER — Encounter: Payer: Self-pay | Admitting: Family

## 2024-03-26 ENCOUNTER — Other Ambulatory Visit: Payer: Self-pay | Admitting: Nurse Practitioner

## 2024-03-26 DIAGNOSIS — G43109 Migraine with aura, not intractable, without status migrainosus: Secondary | ICD-10-CM

## 2024-03-27 ENCOUNTER — Other Ambulatory Visit: Payer: Self-pay | Admitting: Nurse Practitioner

## 2024-03-27 DIAGNOSIS — G43109 Migraine with aura, not intractable, without status migrainosus: Secondary | ICD-10-CM

## 2024-04-04 ENCOUNTER — Telehealth: Payer: Self-pay | Admitting: Family Medicine

## 2024-04-04 DIAGNOSIS — I10 Essential (primary) hypertension: Secondary | ICD-10-CM

## 2024-04-04 MED ORDER — AMLODIPINE BESYLATE 5 MG PO TABS: 5.0000 mg | ORAL_TABLET | Freq: Every day | ORAL | 0 refills | Status: DC

## 2024-04-04 NOTE — Telephone Encounter (Signed)
 Copied from CRM [id number]. Topic: Clinical - Prescription Issue >> Apr 03, 2024  4:41 PM Krista Finley wrote: Reason for CRM: pt called saying she is out of her Amlodipine  and can't get an appt until Tuesday next week.  She has already ask for a refill.  CB# [phone]

## 2024-04-04 NOTE — Telephone Encounter (Signed)
 Amlodipine  Prescription sent to pharmacy, keep follow up visit with me.

## 2024-04-04 NOTE — Telephone Encounter (Signed)
 Patient aware and verbalized understanding.

## 2024-04-11 ENCOUNTER — Encounter: Payer: Self-pay | Admitting: Family

## 2024-04-11 ENCOUNTER — Ambulatory Visit (INDEPENDENT_AMBULATORY_CARE_PROVIDER_SITE_OTHER): Admitting: Family

## 2024-04-11 VITALS — BP 124/67 | HR 73 | Temp 97.9°F | Ht 61.0 in | Wt 156.4 lb

## 2024-04-11 DIAGNOSIS — M8580 Other specified disorders of bone density and structure, unspecified site: Secondary | ICD-10-CM

## 2024-04-11 DIAGNOSIS — G43109 Migraine with aura, not intractable, without status migrainosus: Secondary | ICD-10-CM

## 2024-04-11 DIAGNOSIS — E559 Vitamin D deficiency, unspecified: Secondary | ICD-10-CM

## 2024-04-11 DIAGNOSIS — Z0001 Encounter for general adult medical examination with abnormal findings: Secondary | ICD-10-CM

## 2024-04-11 DIAGNOSIS — I10 Essential (primary) hypertension: Secondary | ICD-10-CM

## 2024-04-11 DIAGNOSIS — Z0000 Encounter for general adult medical examination without abnormal findings: Secondary | ICD-10-CM

## 2024-04-11 DIAGNOSIS — E785 Hyperlipidemia, unspecified: Secondary | ICD-10-CM | POA: Diagnosis not present

## 2024-04-11 MED ORDER — SUMATRIPTAN SUCCINATE 100 MG PO TABS: 100.0000 mg | ORAL_TABLET | ORAL | 3 refills | Status: AC | PRN

## 2024-04-11 NOTE — Patient Instructions (Signed)

## 2024-04-11 NOTE — Progress Notes (Signed)
 Subjective:    Patient ID: Krista Finley, female    DOB: 03-26-1951, 73 y.o.   MRN: [id number]  Chief Complaint  Patient presents with   Medical Management of Chronic Issues   PT presents to the office today for CPE and  chronic follow up.   She has hx of CVA. She has been diagnosed with migraine and saw a Neurologists. She was started on Topamax, but this caused memory issues. She has tried amitriptyline , but caused weakness and slurred speech.  She  only taking imitrex  as needed.   Hypertension This is a chronic problem. The current episode started more than 1 year ago. The problem has been resolved since onset. The problem is controlled. Pertinent negatives include no malaise/fatigue, peripheral edema or shortness of breath. The current treatment provides moderate improvement.  Migraine  This is a chronic problem. The current episode started more than 1 year ago. Episode frequency: once a week. The pain is located in the Temporal region. The pain quality is similar to prior headaches. Pertinent negatives include no nausea, phonophobia, photophobia or vomiting. She has tried triptans for the symptoms. The treatment provided mild relief. Her past medical history is significant for hypertension.  Hyperlipidemia This is a chronic problem. The current episode started more than 1 year ago. The problem is controlled. Recent lipid tests were reviewed and are normal. Pertinent negatives include no shortness of breath. Current antihyperlipidemic treatment includes diet change and statins. The current treatment provides moderate improvement of lipids. Risk factors for coronary artery disease include hypertension, a sedentary lifestyle, post-menopausal and dyslipidemia.      Review of Systems  Constitutional:  Negative for malaise/fatigue.  Eyes:  Negative for photophobia.  Respiratory:  Negative for shortness of breath.   Gastrointestinal:  Negative for nausea and vomiting.  All other  systems reviewed and are negative.   Family History  Problem Relation Age of Onset   Hypertension Mother    Heart disease Mother    Heart disease Father        MI   Parkinson's disease Sister    Parkinson's disease Brother    Stomach cancer Maternal Grandmother    Stomach cancer Maternal Aunt    Colon cancer Neg Hx    Esophageal cancer Neg Hx    Rectal cancer Neg Hx    Breast cancer Neg Hx    Migraines Neg Hx    Social History   Socioeconomic History   Marital status: Single    Spouse name: Not on file   Number of children: Not on file   Years of education: Not on file   Highest education level: High school graduate  Occupational History   Occupation: retired    Associate Professor: [employer]  Tobacco Use   Smoking status: Never   Smokeless tobacco: Never  Vaping Use   Vaping status: Never Used  Substance and Sexual Activity   Alcohol use: Yes    Comment: 1 drink couple times of year   Drug use: No   Sexual activity: Not Currently  Other Topics Concern   Not on file  Social History Narrative   Stays with her mother and disabled brother   Left handed   Caffeine: tea, maybe 1 or 2 cups/day   Social Drivers of Corporate investment banker Strain: Low Risk  (12/15/2023)   Overall Financial Resource Strain (CARDIA)    Difficulty of Paying Living Expenses: Not hard at all  Food Insecurity: No Food Insecurity (12/15/2023)  Hunger Vital Sign    Worried About Running Out of Food in the Last Year: Never true    Ran Out of Food in the Last Year: Never true  Transportation Needs: No Transportation Needs (12/15/2023)   PRAPARE - Administrator, Civil Service (Medical): No    Lack of Transportation (Non-Medical): No  Physical Activity: Unknown (12/15/2023)   Exercise Vital Sign    Days of Exercise per Week: Patient declined    Minutes of Exercise per Session: Not on file  Stress: No Stress Concern Present (12/15/2023)   [employer] of Occupational Health -  Occupational Stress Questionnaire    Feeling of Stress : Not at all  Social Connections: Moderately Integrated (12/15/2023)   Social Connection and Isolation Panel    Frequency of Communication with Friends and Family: More than three times a week    Frequency of Social Gatherings with Friends and Family: More than three times a week    Attends Religious Services: More than 4 times per year    Active Member of [employer] or Organizations: Yes    Attends Banker Meetings: Never    Marital Status: Never married       Objective:   Physical Exam Vitals reviewed.  Constitutional:      General: She is not in acute distress.    Appearance: She is well-developed.  HENT:     Head: Normocephalic and atraumatic.  Eyes:     Pupils: Pupils are equal, round, and reactive to light.  Neck:     Thyroid: No thyromegaly.  Cardiovascular:     Rate and Rhythm: Normal rate and regular rhythm.     Heart sounds: Normal heart sounds. No murmur heard. Pulmonary:     Effort: Pulmonary effort is normal. No respiratory distress.     Breath sounds: Normal breath sounds. No wheezing.  Abdominal:     General: Bowel sounds are normal. There is no distension.     Palpations: Abdomen is soft.     Tenderness: There is no abdominal tenderness.  Musculoskeletal:        General: No tenderness. Normal range of motion.     Cervical back: Normal range of motion and neck supple.  Skin:    General: Skin is warm and dry.  Neurological:     Mental Status: She is alert and oriented to person, place, and time.     Cranial Nerves: No cranial nerve deficit.     Deep Tendon Reflexes: Reflexes are normal and symmetric.  Psychiatric:        Behavior: Behavior normal.        Thought Content: Thought content normal.        Judgment: Judgment normal.        BP 124/67   Pulse 73   Temp 97.9 F (36.6 C)   Ht 5' 1 (1.549 m)   Wt 156 lb 6.4 oz (70.9 kg)   BMI 29.55 kg/m   Assessment & Plan:  Krista Finley comes in today with chief complaint of Medical Management of Chronic Issues   Diagnosis and orders addressed:  1. Annual physical exam (Primary) - CMP14+EGFR - CBC with Differential/Platelet - Lipid panel - TSH  2. Essential hypertension - CMP14+EGFR - CBC with Differential/Platelet  3. Hyperlipidemia, unspecified hyperlipidemia type - CMP14+EGFR - CBC with Differential/Platelet - Lipid panel  4. Vitamin D  deficiency - CMP14+EGFR - CBC with Differential/Platelet - VITAMIN D  25 Hydroxy (Vit-D Deficiency, Fractures)  5.  Osteopenia, unspecified location - CMP14+EGFR - CBC with Differential/Platelet  6. Migraine with aura and without status migrainosus, not intractable Will refill imitrex   Discussed emgality, but will place referral to Neurologists. Pt has had stroke in past and unsure if she is a candidate for this.  - Ambulatory referral to Neurology - CMP14+EGFR - CBC with Differential/Platelet - SUMAtriptan  (IMITREX ) 100 MG tablet; Take 1 tablet (100 mg total) by mouth every 2 (two) hours as needed for migraine. May repeat in 2 hours if headache persists or recurs.  Dispense: 10 tablet; Refill: 3    Labs pending Continue current medications  Referral to Neurologists pending  Health Maintenance reviewed Diet and exercise encouraged  Follow up plan: 6 months    Bari Learn, FNP

## 2024-04-12 LAB — LIPID PANEL
Chol/HDL Ratio: 2.2 ratio (ref 0.0–4.4)
Cholesterol, Total: 168 mg/dL (ref 100–199)
HDL: 78 mg/dL (ref >39)
LDL Chol Calc (NIH): 78 mg/dL (ref 0–99)
Triglycerides: 58 mg/dL (ref 0–149)
VLDL Cholesterol Cal: 12 mg/dL (ref 5–40)

## 2024-04-12 LAB — CBC WITH DIFFERENTIAL/PLATELET
Basophils Absolute: 0 x10E3/uL (ref 0.0–0.2)
Basos: 0 %
EOS (ABSOLUTE): 0.1 x10E3/uL (ref 0.0–0.4)
Eos: 1 %
Hematocrit: 35.5 % (ref 34.0–46.6)
Hemoglobin: 11.4 g/dL (ref 11.1–15.9)
Immature Grans (Abs): 0 x10E3/uL (ref 0.0–0.1)
Immature Granulocytes: 0 %
Lymphocytes Absolute: 1.5 x10E3/uL (ref 0.7–3.1)
Lymphs: 30 %
MCH: 29.2 pg (ref 26.6–33.0)
MCHC: 32.1 g/dL (ref 31.5–35.7)
MCV: 91 fL (ref 79–97)
Monocytes Absolute: 0.4 x10E3/uL (ref 0.1–0.9)
Monocytes: 8 %
Neutrophils Absolute: 2.9 x10E3/uL (ref 1.4–7.0)
Neutrophils: 61 %
Platelets: 282 x10E3/uL (ref 150–450)
RBC: 3.9 x10E6/uL (ref 3.77–5.28)
RDW: 13.2 % (ref 11.7–15.4)
WBC: 4.8 x10E3/uL (ref 3.4–10.8)

## 2024-04-12 LAB — CMP14+EGFR
ALT: 8 IU/L (ref 0–32)
AST: 14 IU/L (ref 0–40)
Albumin: 4.3 g/dL (ref 3.9–4.9)
Alkaline Phosphatase: 130 IU/L — ABNORMAL HIGH (ref 44–121)
BUN/Creatinine Ratio: 9 — ABNORMAL LOW (ref 12–28)
BUN: 11 mg/dL (ref 8–27)
Bilirubin Total: 0.4 mg/dL (ref 0.0–1.2)
CO2: 23 mmol/L (ref 20–29)
Calcium: 9.5 mg/dL (ref 8.7–10.3)
Chloride: 104 mmol/L (ref 96–106)
Creatinine, Ser: 1.2 mg/dL — ABNORMAL HIGH (ref 0.57–1.00)
Globulin, Total: 2.4 g/dL (ref 1.5–4.5)
Glucose: 95 mg/dL (ref 70–99)
Potassium: 4.6 mmol/L (ref 3.5–5.2)
Sodium: 143 mmol/L (ref 134–144)
Total Protein: 6.7 g/dL (ref 6.0–8.5)
eGFR: 49 mL/min/1.73 — ABNORMAL LOW (ref >59)

## 2024-04-12 LAB — VITAMIN D 25 HYDROXY (VIT D DEFICIENCY, FRACTURES): Vit D, 25-Hydroxy: 63.5 ng/mL (ref 30.0–100.0)

## 2024-04-12 LAB — TSH: TSH: 1.1 u[IU]/mL (ref 0.450–4.500)

## 2024-04-13 ENCOUNTER — Ambulatory Visit: Payer: Self-pay | Admitting: Family

## 2024-05-08 ENCOUNTER — Emergency Department (HOSPITAL_COMMUNITY)
Admission: EM | Admit: 2024-05-08 | Discharge: 2024-05-08 | Disposition: A | Discharge status: Home / Self Care | Attending: Emergency Medicine | Admitting: Emergency Medicine

## 2024-05-08 ENCOUNTER — Emergency Department (HOSPITAL_COMMUNITY)

## 2024-05-08 ENCOUNTER — Other Ambulatory Visit: Payer: Self-pay

## 2024-05-08 ENCOUNTER — Encounter (HOSPITAL_COMMUNITY): Payer: Self-pay

## 2024-05-08 DIAGNOSIS — I10 Essential (primary) hypertension: Secondary | ICD-10-CM | POA: Diagnosis not present

## 2024-05-08 DIAGNOSIS — Z7982 Long term (current) use of aspirin: Secondary | ICD-10-CM | POA: Insufficient documentation

## 2024-05-08 DIAGNOSIS — Z79899 Other long term (current) drug therapy: Secondary | ICD-10-CM | POA: Diagnosis not present

## 2024-05-08 DIAGNOSIS — R079 Chest pain, unspecified: Secondary | ICD-10-CM

## 2024-05-08 DIAGNOSIS — R0789 Other chest pain: Secondary | ICD-10-CM | POA: Diagnosis present

## 2024-05-08 LAB — BASIC METABOLIC PANEL WITH GFR
Anion gap: 7 (ref 5–15)
BUN: 13 mg/dL (ref 8–23)
CO2: 25 mmol/L (ref 22–32)
Calcium: 8.7 mg/dL — ABNORMAL LOW (ref 8.9–10.3)
Chloride: 106 mmol/L (ref 98–111)
Creatinine, Ser: 1.12 mg/dL — ABNORMAL HIGH (ref 0.44–1.00)
GFR, Estimated: 53 mL/min — ABNORMAL LOW (ref >60)
Glucose, Bld: 96 mg/dL (ref 70–99)
Potassium: 3.6 mmol/L (ref 3.5–5.1)
Sodium: 138 mmol/L (ref 135–145)

## 2024-05-08 LAB — CBC WITH DIFFERENTIAL/PLATELET
Abs Immature Granulocytes: 0.01 K/uL (ref 0.00–0.07)
Basophils Absolute: 0 K/uL (ref 0.0–0.1)
Basophils Relative: 1 %
Eosinophils Absolute: 0.1 K/uL (ref 0.0–0.5)
Eosinophils Relative: 2 %
HCT: 32 % — ABNORMAL LOW (ref 36.0–46.0)
Hemoglobin: 10.5 g/dL — ABNORMAL LOW (ref 12.0–15.0)
Immature Granulocytes: 0 %
Lymphocytes Relative: 36 %
Lymphs Abs: 1.7 K/uL (ref 0.7–4.0)
MCH: 30 pg (ref 26.0–34.0)
MCHC: 32.8 g/dL (ref 30.0–36.0)
MCV: 91.4 fL (ref 80.0–100.0)
Monocytes Absolute: 0.6 K/uL (ref 0.1–1.0)
Monocytes Relative: 13 %
Neutro Abs: 2.2 K/uL (ref 1.7–7.7)
Neutrophils Relative %: 48 %
Platelets: 261 K/uL (ref 150–400)
RBC: 3.5 MIL/uL — ABNORMAL LOW (ref 3.87–5.11)
RDW: 14.4 % (ref 11.5–15.5)
WBC: 4.6 K/uL (ref 4.0–10.5)
nRBC: 0 % (ref 0.0–0.2)

## 2024-05-08 LAB — CBG MONITORING, ED: Glucose-Capillary: 94 mg/dL (ref 70–99)

## 2024-05-08 LAB — MAGNESIUM: Magnesium: 2 mg/dL (ref 1.7–2.4)

## 2024-05-08 LAB — LIPASE, BLOOD: Lipase: 44 U/L (ref 11–51)

## 2024-05-08 LAB — TROPONIN I (HIGH SENSITIVITY)
Troponin I (High Sensitivity): 3 ng/L (ref <18)
Troponin I (High Sensitivity): 3 ng/L (ref <18)

## 2024-05-08 MED ORDER — NITROGLYCERIN 2 % TD OINT
1.0000 [in_us] | TOPICAL_OINTMENT | Freq: Once | TRANSDERMAL | Status: AC
  Administered 2024-05-08: 1 [in_us] via TOPICAL
  Filled 2024-05-08: qty 1

## 2024-05-08 MED ORDER — METOPROLOL TARTRATE 25 MG PO TABS
25.0000 mg | ORAL_TABLET | Freq: Once | ORAL | Status: AC
  Administered 2024-05-08: 25 mg via ORAL
  Filled 2024-05-08: qty 1

## 2024-05-08 MED ORDER — NAPROXEN 375 MG PO TABS: 375.0000 mg | ORAL_TABLET | Freq: Two times a day (BID) | ORAL | 0 refills | Status: DC

## 2024-05-08 MED ORDER — KETOROLAC TROMETHAMINE 30 MG/ML IJ SOLN
30.0000 mg | Freq: Once | INTRAMUSCULAR | Status: AC
  Administered 2024-05-08: 30 mg via INTRAVENOUS
  Filled 2024-05-08: qty 1

## 2024-05-08 MED ORDER — METOPROLOL TARTRATE 25 MG PO TABS: 12.5000 mg | ORAL_TABLET | Freq: Once | ORAL | Status: DC

## 2024-05-08 MED ORDER — FENTANYL CITRATE (PF) 100 MCG/2ML IJ SOLN
50.0000 ug | Freq: Once | INTRAMUSCULAR | Status: AC
  Administered 2024-05-08: 50 ug via INTRAVENOUS
  Filled 2024-05-08: qty 2

## 2024-05-08 NOTE — Discharge Instructions (Signed)
 We saw you in the ER for the chest pain/shortness of breath. All of our cardiac workup is normal, including labs, EKG and chest X-RAY are normal. We are not sure what is causing your discomfort, but we feel comfortable sending you home at this time. The workup in the ER is not complete, and you should follow up with your primary care doctor for further evaluation.  Start taking baby aspirin  daily. Please return to the ER if you have worsening chest pain, shortness of breath, pain radiating to your jaw, shoulder, or back, sweats or fainting. Otherwise see the Cardiologist or your primary care doctor as requested.

## 2024-05-08 NOTE — ED Triage Notes (Signed)
 BIB [facility] from home, Patient complain of chest pain rated 7/10 that started arounf 5:30am that can be recreated on palpitation BP 168/102 NSR on 12 lead. 4 baby aspirin  and one nitroglycerin  122/70 but 140/90 100% RA the chest pain came down to a 4 after nitroglycerin .

## 2024-05-08 NOTE — ED Provider Notes (Signed)
 [place] EMERGENCY DEPARTMENT AT [facility] Provider Note   CSN: [id number] Arrival date & time: 05/08/24  9380     Patient presents with: Chest Pain   Krista Finley is a 73 y.o. female.   HPI Patient presenting for chest pain.  Medical history includes HTN, HLD, migraines, CVA.  She is prescribed 81 mg aspirin  daily.  Her cardiologist is Dr. Delford.  She has been lost to follow-up for the past several years.  She underwent stress test 5 years ago which was a low risk study at that time.  Last night, she was in her normal state of health.  She woke up at 5 AM with a substernal chest pain.  Initially, it was 7/10 in severity.  EMS was called.  EMS noted hypertension on scene.  She was given 4, 81 mg ASA and 1 SL NTG.  After NTG, patient's blood pressure improved and pain was reduced to a 3/10 in severity.  Pain remains 3/10 in severity at this time.  Patient denies any associated symptoms.    Prior to Admission medications   Medication Sig Start Date End Date Taking? Authorizing Provider  amLODipine  (NORVASC ) 5 MG tablet Take 1 tablet (5 mg total) by mouth daily. **NEEDS TO BE SEEN BEFORE NEXT REFILL** 04/04/24   Hawks, Bari A, FNP  Apoaequorin (PREVAGEN) 10 MG CAPS Take by mouth.    [provider]  aspirin  EC (EQ ASPIRIN  ADULT LOW DOSE) 81 MG tablet Take 1 tablet by mouth once daily 02/21/24   Lavell Bari A, FNP  atorvastatin  (LIPITOR) 40 MG tablet Take 1 tablet (40 mg total) by mouth daily. 01/19/24 01/18/25  Lavell Bari LABOR, FNP  gabapentin  (NEURONTIN ) 100 MG capsule Take 100 mg by mouth at bedtime.    [provider]  guaifenesin  (HUMIBID E) 400 MG TABS tablet Take 1 tablet (400 mg total) by mouth every 6 (six) hours as needed. 11/03/23   [facility] Sebastian Pool, NP  metoprolol  tartrate (LOPRESSOR ) 25 MG tablet Take 1 tablet (25 mg total) by mouth 2 (two) times daily. 06/17/23   Lavell Bari LABOR, FNP  SUMAtriptan  (IMITREX ) 100 MG tablet Take 1  tablet (100 mg total) by mouth every 2 (two) hours as needed for migraine. May repeat in 2 hours if headache persists or recurs. 04/11/24   Lavell Bari LABOR, FNP    Allergies: Patient has no known allergies.    Review of Systems  Cardiovascular:  Positive for chest pain.  All other systems reviewed and are negative.   Updated Vital Signs BP (!) 152/66   Pulse 68   Temp 98.9 F (37.2 C) (Oral)   Resp 15   Ht 5' 4 (1.626 m)   Wt 68 kg   SpO2 98%   BMI 25.75 kg/m   Physical Exam Vitals and nursing note reviewed.  Constitutional:      General: She is not in acute distress.    Appearance: She is well-developed. She is not ill-appearing, toxic-appearing or diaphoretic.  HENT:     Head: Normocephalic and atraumatic.  Eyes:     Conjunctiva/sclera: Conjunctivae normal.  Cardiovascular:     Rate and Rhythm: Normal rate and regular rhythm.     Heart sounds: No murmur heard. Pulmonary:     Effort: Pulmonary effort is normal. No respiratory distress.     Breath sounds: Normal breath sounds.  Abdominal:     Palpations: Abdomen is soft.     Tenderness: There is no  abdominal tenderness.  Musculoskeletal:        General: No swelling. Normal range of motion.     Cervical back: Normal range of motion and neck supple.  Skin:    General: Skin is warm and dry.  Neurological:     General: No focal deficit present.     Mental Status: She is alert and oriented to person, place, and time.  Psychiatric:        Mood and Affect: Mood normal.        Behavior: Behavior normal.     (all labs ordered are listed, but only abnormal results are displayed) Labs Reviewed  CBC WITH DIFFERENTIAL/PLATELET - Abnormal; Notable for the following components:      Result Value   RBC 3.50 (*)    Hemoglobin 10.5 (*)    HCT 32.0 (*)    All other components within normal limits  BASIC METABOLIC PANEL WITH GFR  LIPASE, BLOOD  MAGNESIUM   CBG MONITORING, ED  TROPONIN I (HIGH SENSITIVITY)    EKG: EKG  Interpretation Date/Time:  Monday May 08 2024 06:29:53 EDT Ventricular Rate:  77 PR Interval:    QRS Duration:  97 QT Interval:  406 QTC Calculation: 460 R Axis:   61  Text Interpretation: Sinus rhythm Borderline low voltage, extremity leads Confirmed by Melvenia Motto ([id number]) on 05/08/2024 6:40:01 AM  Radiology: [facility] Chest Portable 1 View Result Date: 05/08/2024 CLINICAL DATA:  Chest pain EXAM: PORTABLE CHEST 1 VIEW COMPARISON:  02/05/2020 FINDINGS: The lungs are clear without focal pneumonia, edema, pneumothorax or pleural effusion. The cardiopericardial silhouette is within normal limits for size. No acute bony abnormality. Telemetry leads overlie the chest. IMPRESSION: No active disease. Electronically Signed   By: Camellia Candle M.D.   On: 05/08/2024 06:49     Procedures   Medications Ordered in the ED  nitroGLYCERIN  (NITROGLYN) 2 % ointment 1 inch (1 inch Topical Given 05/08/24 0649)  fentaNYL  (SUBLIMAZE ) injection 50 mcg (50 mcg Intravenous Given 05/08/24 0647)  metoprolol  tartrate (LOPRESSOR ) tablet 25 mg (25 mg Oral Given 05/08/24 9348)                                    Medical Decision Making Amount and/or Complexity of Data Reviewed Labs: ordered. Radiology: ordered.  Risk Prescription drug management.   This patient presents to the ED for concern of chest pain, this involves an extensive number of treatment options, and is a complaint that carries with it a high risk of complications and morbidity.  The differential diagnosis includes ACS, pericarditis, GERD, musculoskeletal etiology   Co morbidities / Chronic conditions that complicate the patient evaluation  HTN, HLD, migraines, CVA   Additional history obtained:  Additional history obtained from EMR External records from outside source obtained and reviewed including EMS   Lab Tests:  I Ordered, and personally interpreted labs.  The pertinent results include: Baseline hemoglobin, no leukocytosis, normal  initial troponin, baseline creatinine, normal electrolytes, repeat troponin pending at time of signout.   Imaging Studies ordered:  I ordered imaging studies including chest x-ray I independently visualized and interpreted imaging which showed no acute findings I agree with the radiologist interpretation   Cardiac Monitoring: / EKG:  The patient was maintained on a cardiac monitor.  I personally viewed and interpreted the cardiac monitored which showed an underlying rhythm of: Sinus rhythm   Problem List / ED Course / Critical interventions /  Medication management  Patient presents for chest pain.  This woke her from sleep at 5 AM.  It did improve with NTG.  On arrival, patient is well-appearing.  Blood pressure is elevated.  Current pain is 3/10 in severity.  NTG ointment was ordered in addition to fentanyl  for analgesia.  EKG does not show ST segment elevations.  Patient was kept on monitor.  Workup was initiated.  Chest x-ray did not show any acute findings.  On reassessment, patient's pain has resolved.  Initial troponin is normal.  Care of patient signed out to oncoming ED provider. I ordered medication including NTG, metoprolol , fentanyl  for chest pain Reevaluation of the patient after these medicines showed that the patient improved I have reviewed the patients home medicines and have made adjustments as needed  Social Determinants of Health:  Lives independently     Final diagnoses:  Chest pain, unspecified type    ED Discharge Orders     None          Melvenia Motto, MD 05/08/24 [phone]

## 2024-05-08 NOTE — Consult Note (Signed)
 Cardiology Consultation   Patient ID: Krista Finley MRN: [id number]; DOB: 1951-07-08  Admit date: 05/08/2024 Date of Consult: 05/08/2024  PCP:  Lavell Bari LABOR, FNP   [facility] Providers Cardiologist:  Delford    Patient Profile: Krista Finley is a 73 y.o. female with a hx of HTN,HLD, prior CVA who is being seen 05/08/2024 for the evaluation of chest pain at the request of Dr Charlyn  Reports chest pain started around 5AM this morning. 5-6/10 pain midchest without other associated symptoms. Lasted about 5 minutes but would come and go. After 15 minutes she called EMS. Reportedly hypertensive by EMS evaluation, do not have documentation. She reports upon arriving to ER around 6AM has had a constant 7/10 tightness midchest. Pain is not positional. Pain is worst with palpation.    K 3.6 BUN 13 Cr 1.12 Lipase 44 Mg 2 WBC 4.6 Hgb 10.5 Plt 261  Trop 3--> CXR: no acute process EKG: NSR, no ischemic changes  02/2019 nuclear stress: no ischemia 05/2023 echo: LVEF 50-55%< no WMAs, grade  I dd   History of Present Illness: Krista Finley 73 yo female history of HTN, HLD, prior CVA,    Past Medical History:  Diagnosis Date   CVA (cerebral vascular accident) (HCC)    Gastritis    Headache    Hyperlipidemia    Hypertension    Migraine     Past Surgical History:  Procedure Laterality Date   ELBOW SURGERY Right       Scheduled Meds:  Continuous Infusions:  PRN Meds:   Allergies:   No Known Allergies  Social History:   Social History   Socioeconomic History   Marital status: Single    Spouse name: Not on file   Number of children: Not on file   Years of education: Not on file   Highest education level: High school graduate  Occupational History   Occupation: retired    Associate Professor: [employer]  Tobacco Use   Smoking status: Never   Smokeless tobacco: Never  Vaping Use   Vaping status: Never Used  Substance and Sexual Activity   Alcohol use: Yes     Comment: 1 drink couple times of year   Drug use: No   Sexual activity: Not Currently  Other Topics Concern   Not on file  Social History Narrative   Stays with her mother and disabled brother   Left handed   Caffeine: tea, maybe 1 or 2 cups/day   Social Drivers of Corporate investment banker Strain: Low Risk  (12/15/2023)   Overall Financial Resource Strain (CARDIA)    Difficulty of Paying Living Expenses: Not hard at all  Food Insecurity: No Food Insecurity (12/15/2023)   Hunger Vital Sign    Worried About Running Out of Food in the Last Year: Never true    Ran Out of Food in the Last Year: Never true  Transportation Needs: No Transportation Needs (12/15/2023)   PRAPARE - Administrator, Civil Service (Medical): No    Lack of Transportation (Non-Medical): No  Physical Activity: Unknown (12/15/2023)   Exercise Vital Sign    Days of Exercise per Week: Patient declined    Minutes of Exercise per Session: Not on file  Stress: No Stress Concern Present (12/15/2023)   [employer] of Occupational Health - Occupational Stress Questionnaire    Feeling of Stress : Not at all  Social Connections: Moderately Integrated (12/15/2023)   Social Connection and Isolation Panel  Frequency of Communication with Friends and Family: More than three times a week    Frequency of Social Gatherings with Friends and Family: More than three times a week    Attends Religious Services: More than 4 times per year    Active Member of [employer] or Organizations: Yes    Attends Banker Meetings: Never    Marital Status: Never married  Intimate Partner Violence: Not At Risk (12/15/2023)   Humiliation, Afraid, Rape, and Kick questionnaire    Fear of Current or Ex-Partner: No    Emotionally Abused: No    Physically Abused: No    Sexually Abused: No    Family History:    Family History  Problem Relation Age of Onset   Hypertension Mother    Heart disease Mother    Heart  disease Father        MI   Parkinson's disease Sister    Parkinson's disease Brother    Stomach cancer Maternal Grandmother    Stomach cancer Maternal Aunt    Colon cancer Neg Hx    Esophageal cancer Neg Hx    Rectal cancer Neg Hx    Breast cancer Neg Hx    Migraines Neg Hx      ROS:  Please see the history of present illness.   All other ROS reviewed and negative.     Physical Exam/Data: Vitals:   05/08/24 0700 05/08/24 0730 05/08/24 0800 05/08/24 0802  BP: (!) 152/66 136/63 (!) 140/67 (!) 140/67  Pulse: 68 67 65 63  Resp: 15 12 15 12   Temp:      TempSrc:      SpO2: 98% 96% 94% 98%  Weight:      Height:       No intake or output data in the 24 hours ending 05/08/24 0830    05/08/2024    6:28 AM 04/11/2024    9:57 AM 12/15/2023    8:32 AM  Last 3 Weights  Weight (lbs) 150 lb 156 lb 6.4 oz 155 lb  Weight (kg) 68.04 kg 70.943 kg 70.308 kg     Body mass index is 25.75 kg/m.  General:  Well nourished, well developed, in no acute distress HEENT: normal Neck: no JVD Vascular: No carotid bruits; Distal pulses 2+ bilaterally Cardiac:  normal S1, S2; RRR; no murmur  Lungs:  clear to auscultation bilaterally, no wheezing, rhonchi or rales  Abd: soft, nontender, no hepatomegaly  Ext: no edema Musculoskeletal: chest wall tender to palpation Skin: warm and dry  Neuro:  CNs 2-12 intact, no focal abnormalities noted Psych:  Normal affect     Laboratory Data: High Sensitivity Troponin:   Recent Labs  Lab 05/08/24 0630  TROPONINIHS 3     Chemistry Recent Labs  Lab 05/08/24 0630  NA 138  K 3.6  CL 106  CO2 25  GLUCOSE 96  BUN 13  CREATININE 1.12*  CALCIUM  8.7*  MG 2.0  GFRNONAA 53*  ANIONGAP 7    No results for input(s): PROT, ALBUMIN, AST, ALT, ALKPHOS, BILITOT in the last 168 hours. Lipids No results for input(s): CHOL, TRIG, HDL, LABVLDL, LDLCALC, CHOLHDL in the last 168 hours.  Hematology Recent Labs  Lab 05/08/24 0630  WBC  4.6  RBC 3.50*  HGB 10.5*  HCT 32.0*  MCV 91.4  MCH 30.0  MCHC 32.8  RDW 14.4  PLT 261   Thyroid No results for input(s): TSH, FREET4 in the last 168 hours.  BNPNo results for  input(s): BNP, PROBNP in the last 168 hours.  DDimer No results for input(s): DDIMER in the last 168 hours.  Radiology/Studies:  DG Chest Portable 1 View Result Date: 05/08/2024 CLINICAL DATA:  Chest pain EXAM: PORTABLE CHEST 1 VIEW COMPARISON:  02/05/2020 FINDINGS: The lungs are clear without focal pneumonia, edema, pneumothorax or pleural effusion. The cardiopericardial silhouette is within normal limits for size. No acute bony abnormality. Telemetry leads overlie the chest. IMPRESSION: No active disease. Electronically Signed   By: Camellia Candle M.D.   On: 05/08/2024 06:49     Assessment and Plan:   1.Chest pain -atypical story. Initially sharp chest pain that resolved, now constant tightness midchest going on 2 hours now at the time of our conversation. Chest wall is tender to palpation. - EKG and initial troponin are benign, second trop is pending - at this time do not suspect ACS. More likely MSK related pain, given dose of toradol . Follow symptoms and f/u second troponin, most likely dicharge from ER later this morning.     Risk Assessment/Risk Scores:              For questions or updates, please contact [facility] Please consult [email or web address] for contact info under      Signed, Alvan Carrier, MD  05/08/2024 8:30 AM

## 2024-05-08 NOTE — ED Notes (Signed)
 ED Provider at bedside.

## 2024-05-08 NOTE — ED Provider Notes (Addendum)
  Physical Exam  BP (!) 152/66   Pulse 68   Temp 98.9 F (37.2 C) (Oral)   Resp 15   Ht 5' 4 (1.626 m)   Wt 68 kg   SpO2 98%   BMI 25.75 kg/m   Physical Exam  Procedures  Procedures  ED Course / MDM    Medical Decision Making Amount and/or Complexity of Data Reviewed Labs: ordered. Radiology: ordered.  Risk Prescription drug management.   Assuming care of patient from Dr. Melvenia   Patient in the ED for Chest pain, substernal, responded to nitroglycerine at home. Workup thus far shows normal EKG, normal troponin of 3.  Concerning findings are as following - none Important pending results are repeat trop  According to Dr. Melvenia, plan is to consult Cardiology after repeat troponin   Patient had no complains, no concerns from the nursing side. Will continue to monitor.    Charlyn Sora, MD 05/08/24 0725  9:29 AM I had discussed case with Dr. Alvan, cardiology.  Patient's initial troponin is normal.  Dr. Alvan has seen the patient.  If the repeat troponin is negative, he is comfortable with patient being discharged.  Patient remains chest pain-free.  The patient appears reasonably screened and/or stabilized for discharge and I doubt any other medical condition or other [facility] requiring further screening, evaluation, or treatment in the ED at this time prior to discharge.   Results from the ER workup discussed with the patient face to face and all questions answered to the best of my ability. The patient is safe for discharge with strict return precautions.    Charlyn Sora, MD 05/08/24 0930

## 2024-06-01 ENCOUNTER — Other Ambulatory Visit: Payer: Self-pay | Admitting: Family

## 2024-06-01 DIAGNOSIS — I10 Essential (primary) hypertension: Secondary | ICD-10-CM

## 2024-06-05 NOTE — Progress Notes (Deleted)
 Date:  06/05/2024   ID:  [name], DOB 04/07/1951, MRN [id number]   PCP:  Lavell Bari LABOR, FNP  Cardiologist:   Delford  Electrophysiologist:  None   Evaluation Performed:  F/U visit  Chief Complaint:  Chest Pain  History of Present Illness:    Krista Finley is a 73 y.o. female with history of HTN, HLD and CVA. Referred by Bari Lavell FNP for chest pain First tele vist done 12/28/18  Reviewed ER notes from 12/22/18 2 weeks of mid sternal chest pain. Can radiate to back and left breast Pain intermittent and not related to exertion  Pain for a week or so No history of CAD.  BP was elevated at 222/94 Pain was reproducible to palpation Was on metoprolol  for BP no diuretic but K was 2.9 Supplemented Rx with Toradol  and Norco/Vicodin with relief in ER  No triggers for pain Rated it 10/10 worse when sitting watching TV  CXR NAD normal mediastinum   Stroke appears to have been in 2016 with negative MRI Presented with right sided weakness  MRI with acute non hemorrhagic infarct in posterior left lenticular nucleus and posterior left corona radiata Echo done at that time for stroke showed EF 55-60% no LVH no valve disease   Myovue normal 03/09/19 EF 55-60%  Primary added norvasc  for BP 02/16/19  Having some sciatic pain and flank pain Rx Toradol  and prednisone  UA was negative   Has two disabled family members and 48 yo mom at home she needs to care for    Seen in ER for chest pain 05/08/24. Atypical pain to palpation lasted about 5 minutes ? HTN per EMS no documentation. CXR NAD, R/O no acute ECG chagnes.  ? MSK related d/c home Troponin negative x 2   ***   Past Medical History:  Diagnosis Date   CVA (cerebral vascular accident) (HCC)    Gastritis    Headache    Hyperlipidemia    Hypertension    Migraine    Past Surgical History:  Procedure Laterality Date   ELBOW SURGERY Right      No outpatient medications have been marked as taking for the 06/13/24 encounter  (Appointment) with Oriel Ojo C, MD.     Allergies:   Patient has no known allergies.   Social History   Tobacco Use   Smoking status: Never   Smokeless tobacco: Never  Vaping Use   Vaping status: Never Used  Substance Use Topics   Alcohol use: Yes    Comment: 1 drink couple times of year   Drug use: No     Family Hx: The patient's family history includes Heart disease in her father and mother; Hypertension in her mother; Parkinson's disease in her brother and sister; Stomach cancer in her maternal aunt and maternal grandmother. There is no history of Colon cancer, Esophageal cancer, Rectal cancer, Breast cancer, or Migraines.  ROS:   Please see the history of present illness.     All other systems reviewed and are negative.   Prior CV studies:   The following studies were reviewed today:  Notes from ER, labs, CXR TTE 2016   Labs/Other Tests and Data Reviewed:    EKG:   12/23/18 NSR rate 68 normal  05/08/24 SR rate 75 normal   Recent Labs: 04/11/2024: ALT 8; TSH 1.100 05/08/2024: BUN 13; Creatinine, Ser 1.12; Hemoglobin 10.5; Magnesium  2.0; Platelets 261; Potassium 3.6; Sodium 138   Recent Lipid Panel Lab Results  Component Value Date/Time   CHOL 168 04/11/2024 10:35 AM   TRIG 58 04/11/2024 10:35 AM   HDL 78 04/11/2024 10:35 AM   CHOLHDL 2.2 04/11/2024 10:35 AM   CHOLHDL 2.8 06/22/2023 04:10 AM   LDLCALC 78 04/11/2024 10:35 AM    Wt Readings from Last 3 Encounters:  05/08/24 150 lb (68 kg)  04/11/24 156 lb 6.4 oz (70.9 kg)  12/15/23 155 lb (70.3 kg)     Objective:    Vital Signs:  There were no vitals taken for this visit.   Affect appropriate Overweight black female HEENT: normal Neck supple with no adenopathy JVP normal no bruits no thyromegaly Lungs clear with no wheezing and good diaphragmatic motion Heart:  S1/S2 no murmur, no rub, gallop or click PMI normal Abdomen: benighn, BS positve, no tenderness, no AAA no bruit.  No HSM or HJR Distal  pulses intact with no bruits No edema Neuro non-focal Skin warm and dry No muscular weakness   ASSESSMENT & PLAN:    HTN:  norvasc /lopressor   improved  CVA:  2016 no carotid disease lacunar stable Chest Pain atypical r/o ECG normal Myovue 03/09/19 Shared decision making favor cardiac CTA to further risk stratify  Cr normal on 05/08/24  HLD:  Continue lipitor labs with primary  Migraines.  Sees neurology Imitrex  PRN    COVID-19 Education: The signs and symptoms of COVID-19 were discussed with the patient and how to seek care for testing (follow up with PCP or arrange E-visit).  The importance of social distancing was discussed today.  Time:   Today, I have spent 30 minutes with the patient     Medication Adjustments/Labs and Tests Ordered: Current medicines are reviewed at length with the patient today.  Concerns regarding medicines are outlined above.   Tests Ordered:  Cardiac CTA BMET  50 mg lopressor  2 hours before study   Medication Changes: No orders of the defined types were placed in this encounter.   Disposition:  Follow up in a year pending testing   Signed, Maude Emmer, MD  06/05/2024 12:52 PM    [facility]

## 2024-06-13 ENCOUNTER — Ambulatory Visit: Attending: Cardiovascular Disease | Admitting: Cardiovascular Disease

## 2024-06-13 ENCOUNTER — Encounter: Payer: Self-pay | Admitting: Family

## 2024-06-13 ENCOUNTER — Ambulatory Visit: Admitting: Family

## 2024-06-24 ENCOUNTER — Other Ambulatory Visit: Payer: Self-pay | Admitting: *Deleted

## 2024-06-30 ENCOUNTER — Other Ambulatory Visit: Payer: Self-pay | Admitting: *Deleted

## 2024-06-30 DIAGNOSIS — I10 Essential (primary) hypertension: Secondary | ICD-10-CM

## 2024-08-07 ENCOUNTER — Ambulatory Visit: Admitting: Family

## 2024-08-07 ENCOUNTER — Ambulatory Visit

## 2024-08-07 ENCOUNTER — Encounter: Payer: Self-pay | Admitting: Family

## 2024-08-07 VITALS — BP 136/84 | HR 77 | Temp 97.1°F | Ht 64.0 in | Wt 159.4 lb

## 2024-08-07 DIAGNOSIS — M79672 Pain in left foot: Secondary | ICD-10-CM

## 2024-08-07 MED ORDER — PREDNISONE 20 MG PO TABS: 40.0000 mg | ORAL_TABLET | Freq: Every day | ORAL | 0 refills | Status: AC

## 2024-08-07 MED ORDER — DICLOFENAC SODIUM 75 MG PO TBEC: 75.0000 mg | DELAYED_RELEASE_TABLET | Freq: Two times a day (BID) | ORAL | 0 refills | Status: DC

## 2024-08-07 NOTE — Patient Instructions (Signed)
 Boney Growth on the Heel (Heel Spur): What to Know  A heel spur is a bony growth that forms on the bottom of the heel bone. This growth can cause pain and discomfort on the bottom of your foot near the heel. The pain can get worse when walking or standing. Heel spurs often form over time due to repeated stress on the heel bone and nearby tissues. What are the causes? The exact cause of heel spurs isn't known. They may be caused by: Pressure on the heel bone. Irritation of the strong tissues that connect the muscle to the heel bone. These strong tissues are called tendons. What increases the risk? You're more likely to get a heel spur if you: Are older than age 74. Are overweight. Have wear-and-tear arthritis, also called osteoarthritis. Have inflammation of the band of tissue that connects the toe bones to the heel bone (plantar fascia). Play sports or do activities that include a lot of running or jumping. Wear poorly fitted shoes. What are the signs or symptoms? Some people have no symptoms. If you do have symptoms, they may include: Pain in the bottom of your heel. Pain that's worse when you first get out of bed. Pain that gets worse after walking or standing. How is this diagnosed? This may be diagnosed based on: Your symptoms and medical history. A physical exam. A foot X-ray. How is this treated? Treatment depends on how much pain you have. Treatment options may include: Doing stretching exercises. Losing weight, if needed. Wearing specific shoes or shoe inserts called orthotics. Wearing splints on your feet while you sleep. Splints keep your feet in a position (usually a 90-degree angle) that can prevent and relieve pain when you get out of bed. Splints can make stretching easier in the morning. Taking medicine to relieve pain. This may include NSAIDs, such as ibuprofen. Using high-intensity sound waves to break up the heel spur. This is called extracorporeal shock wave  therapy. Getting steroid shots in your heel to lessen inflammation. Having surgery, if your heel spur causes long-term (chronic) pain. Follow these instructions at home:  Activity Avoid activities that cause pain until you recover, or for as long as told. Do stretching exercises. Stretch before exercising or being physically active. Managing pain, stiffness, and swelling Use ice or an ice pack as told. Place a towel between your skin and the ice. Leave the ice on for 20 minutes, 2-3 times a day. If your skin turns red, take off the ice right away to prevent skin damage. The risk of damage is higher if you can't feel pain, heat, or cold. Move your toes often. This can lessen stiffness and swelling. Raise your foot above the level of your heart while you're sitting or lying down. General instructions Take your medicines only as told. Wear supportive shoes that fit well. Wear splints, inserts, or orthotics as told. If recommended, work with your provider to lose weight. This can relieve pressure on your foot. Do not smoke, vape, or use nicotine or tobacco. Doing this can slow down healing. Where to find more information To learn more, go to: [employer] at [employer]aaos.org Click "Search" and type "bone spurs." Find the link you need. Contact a health care provider if: Your pain doesn't go away with treatment. Your pain gets worse. This information is not intended to replace advice given to you by your health care provider. Make sure you discuss any questions you have with your health care provider.  Document Revised: 04/15/2023 Document Reviewed: 04/15/2023 Elsevier Patient Education  2024 [employer].

## 2024-08-07 NOTE — Progress Notes (Signed)
 Subjective:    Patient ID: Krista Finley, female    DOB: 02-26-1951, 73 y.o.   MRN: [id number]  Chief Complaint  Patient presents with   Foot Pain    Left heel pain x 2 days, no known injury   PT presents to the office today with left heel pain that started two days. Denies any injury, fever, or redness.  Reports constant pain that is worse when walking of a 6-7 out 10 when walking.  Foot Pain This is a new problem. The current episode started in the past 7 days. The problem occurs constantly. The problem has been unchanged. Pertinent negatives include no chills, fever or rash.      Review of Systems  Constitutional:  Negative for chills and fever.  Skin:  Negative for rash.  All other systems reviewed and are negative.   Social History   Socioeconomic History   Marital status: Single    Spouse name: Not on file   Number of children: Not on file   Years of education: Not on file   Highest education level: High school graduate  Occupational History   Occupation: retired    Associate Professor: [employer]  Tobacco Use   Smoking status: Never   Smokeless tobacco: Never  Vaping Use   Vaping status: Never Used  Substance and Sexual Activity   Alcohol use: Yes    Comment: 1 drink couple times of year   Drug use: No   Sexual activity: Not Currently  Other Topics Concern   Not on file  Social History Narrative   Stays with her mother and disabled brother   Left handed   Caffeine: tea, maybe 1 or 2 cups/day   Social Drivers of Health   Tobacco Use: Low Risk (08/07/2024)   Patient History    Smoking Tobacco Use: Never    Smokeless Tobacco Use: Never    Passive Exposure: Not on file  Financial Resource Strain: Low Risk (12/15/2023)   Overall Financial Resource Strain (CARDIA)    Difficulty of Paying Living Expenses: Not hard at all  Food Insecurity: No Food Insecurity (12/15/2023)   Hunger Vital Sign    Worried About Running Out of Food in the Last Year: Never true    Ran  Out of Food in the Last Year: Never true  Transportation Needs: No Transportation Needs (12/15/2023)   PRAPARE - Administrator, Civil Service (Medical): No    Lack of Transportation (Non-Medical): No  Physical Activity: Unknown (12/15/2023)   Exercise Vital Sign    Days of Exercise per Week: Patient declined    Minutes of Exercise per Session: Not on file  Stress: No Stress Concern Present (12/15/2023)   [employer] of Occupational Health - Occupational Stress Questionnaire    Feeling of Stress : Not at all  Social Connections: Moderately Integrated (12/15/2023)   Social Connection and Isolation Panel    Frequency of Communication with Friends and Family: More than three times a week    Frequency of Social Gatherings with Friends and Family: More than three times a week    Attends Religious Services: More than 4 times per year    Active Member of Clubs or Organizations: Yes    Attends Banker Meetings: Never    Marital Status: Never married  Depression (PHQ2-9): Low Risk (08/07/2024)   Depression (PHQ2-9)    PHQ-2 Score: 0  Alcohol Screen: Low Risk (12/15/2023)   Alcohol Screen    Last  Alcohol Screening Score (AUDIT): 0  Housing: Unknown (12/15/2023)   Housing Stability Vital Sign    Unable to Pay for Housing in the Last Year: No    Number of Times Moved in the Last Year: Not on file    Homeless in the Last Year: No  Utilities: Not At Risk (12/15/2023)   AHC Utilities    Threatened with loss of utilities: No  Health Literacy: Adequate Health Literacy (12/15/2023)   B1300 Health Literacy    Frequency of need for help with medical instructions: Never   Family History  Problem Relation Age of Onset   Hypertension Mother    Heart disease Mother    Heart disease Father        MI   Parkinson's disease Sister    Parkinson's disease Brother    Stomach cancer Maternal Grandmother    Stomach cancer Maternal Aunt    Colon cancer Neg Hx    Esophageal cancer  Neg Hx    Rectal cancer Neg Hx    Breast cancer Neg Hx    Migraines Neg Hx         Objective:   Physical Exam Vitals reviewed.  Constitutional:      General: She is not in acute distress.    Appearance: She is well-developed.  HENT:     Head: Normocephalic and atraumatic.  Eyes:     Pupils: Pupils are equal, round, and reactive to light.  Neck:     Thyroid: No thyromegaly.  Cardiovascular:     Rate and Rhythm: Normal rate and regular rhythm.     Heart sounds: Normal heart sounds. No murmur heard. Pulmonary:     Effort: Pulmonary effort is normal. No respiratory distress.     Breath sounds: Normal breath sounds. No wheezing.  Abdominal:     General: Bowel sounds are normal. There is no distension.     Palpations: Abdomen is soft.     Tenderness: There is no abdominal tenderness.  Musculoskeletal:        General: Tenderness present. Normal range of motion.     Cervical back: Normal range of motion and neck supple.       Legs:     Comments: Left heel pain with palpation   Skin:    General: Skin is warm and dry.  Neurological:     Mental Status: She is alert and oriented to person, place, and time.     Cranial Nerves: No cranial nerve deficit.     Deep Tendon Reflexes: Reflexes are normal and symmetric.  Psychiatric:        Behavior: Behavior normal.        Thought Content: Thought content normal.        Judgment: Judgment normal.       BP 136/84   Pulse 77   Temp (!) 97.1 F (36.2 C)   Ht 5' 4 (1.626 m)   Wt 159 lb 6.4 oz (72.3 kg)   SpO2 98%   BMI 27.36 kg/m      Assessment & Plan:  Krista Finley comes in today with chief complaint of Foot Pain (Left heel pain x 2 days, no known injury)   Diagnosis and orders addressed:  1. Pain of left heel (Primary) Rest Ice  Start prednisone  and diclofenac  BID  No other NSAID's  Follow up if symptoms worsen or do not improve  - predniSONE  (DELTASONE ) 20 MG tablet; Take 2 tablets (40 mg total) by mouth  daily  with breakfast for 5 days. 2 po daily for 5 days  Dispense: 10 tablet; Refill: 0 - diclofenac  (VOLTAREN ) 75 MG EC tablet; Take 1 tablet (75 mg total) by mouth 2 (two) times daily.  Dispense: 30 tablet; Refill: 0 - DG Foot Complete Left; Future      Bari Learn, FNP

## 2024-08-14 ENCOUNTER — Ambulatory Visit: Payer: Self-pay | Admitting: Family

## 2024-08-30 ENCOUNTER — Other Ambulatory Visit: Payer: Self-pay | Admitting: Family

## 2024-08-30 DIAGNOSIS — M79672 Pain in left foot: Secondary | ICD-10-CM

## 2024-09-08 ENCOUNTER — Other Ambulatory Visit: Payer: Self-pay | Admitting: Family

## 2024-09-25 ENCOUNTER — Other Ambulatory Visit: Payer: Self-pay | Admitting: Family

## 2024-10-04 ENCOUNTER — Institutional Professional Consult (permissible substitution): Admitting: Neurology
